# Patient Record
Sex: Female | Born: 1935 | Race: White | Hispanic: No | Marital: Married | Smoking: Former smoker
Health system: Southern US, Community
[De-identification: ages and names within clinical notes are randomized; demographics above are authoritative.]

## PROBLEM LIST (undated history)

## (undated) DIAGNOSIS — T8859XA Other complications of anesthesia, initial encounter: Secondary | ICD-10-CM

## (undated) DIAGNOSIS — J449 Chronic obstructive pulmonary disease, unspecified: Secondary | ICD-10-CM

## (undated) DIAGNOSIS — D649 Anemia, unspecified: Secondary | ICD-10-CM

## (undated) DIAGNOSIS — F329 Major depressive disorder, single episode, unspecified: Secondary | ICD-10-CM

## (undated) DIAGNOSIS — F419 Anxiety disorder, unspecified: Secondary | ICD-10-CM

## (undated) DIAGNOSIS — N3020 Other chronic cystitis without hematuria: Secondary | ICD-10-CM

## (undated) DIAGNOSIS — F32A Depression, unspecified: Secondary | ICD-10-CM

## (undated) DIAGNOSIS — I10 Essential (primary) hypertension: Secondary | ICD-10-CM

## (undated) DIAGNOSIS — M549 Dorsalgia, unspecified: Secondary | ICD-10-CM

## (undated) DIAGNOSIS — T4145XA Adverse effect of unspecified anesthetic, initial encounter: Secondary | ICD-10-CM

## (undated) DIAGNOSIS — N3946 Mixed incontinence: Secondary | ICD-10-CM

## (undated) DIAGNOSIS — M797 Fibromyalgia: Secondary | ICD-10-CM

## (undated) DIAGNOSIS — G8929 Other chronic pain: Secondary | ICD-10-CM

## (undated) HISTORY — PX: SPINAL CORD STIMULATOR IMPLANT: SHX2422

## (undated) HISTORY — PX: TONSILLECTOMY: SUR1361

## (undated) HISTORY — PX: APPENDECTOMY: SHX54

## (undated) HISTORY — DX: Depression, unspecified: F32.A

## (undated) HISTORY — PX: CATARACT EXTRACTION: SUR2

## (undated) HISTORY — DX: Dorsalgia, unspecified: M54.9

## (undated) HISTORY — PX: CHOLECYSTECTOMY: SHX55

## (undated) HISTORY — PX: ABDOMINAL HYSTERECTOMY: SHX81

## (undated) HISTORY — PX: KNEE SURGERY: SHX244

## (undated) HISTORY — DX: Major depressive disorder, single episode, unspecified: F32.9

## (undated) HISTORY — DX: Essential (primary) hypertension: I10

## (undated) HISTORY — DX: Chronic obstructive pulmonary disease, unspecified: J44.9

## (undated) HISTORY — PX: OTHER SURGICAL HISTORY: SHX169

## (undated) HISTORY — DX: Fibromyalgia: M79.7

---

## 1999-02-25 ENCOUNTER — Ambulatory Visit (HOSPITAL_COMMUNITY): Admission: RE | Admit: 1999-02-25 | Discharge: 1999-02-25 | Payer: Self-pay | Admitting: *Deleted

## 2000-12-03 ENCOUNTER — Ambulatory Visit (HOSPITAL_COMMUNITY): Admission: RE | Admit: 2000-12-03 | Discharge: 2000-12-03 | Payer: Self-pay | Admitting: Specialist

## 2000-12-03 ENCOUNTER — Encounter: Payer: Self-pay | Admitting: Specialist

## 2001-09-02 ENCOUNTER — Ambulatory Visit (HOSPITAL_COMMUNITY): Admission: RE | Admit: 2001-09-02 | Discharge: 2001-09-02 | Payer: Self-pay | Admitting: Orthopaedic Surgery

## 2001-09-02 ENCOUNTER — Encounter: Payer: Self-pay | Admitting: Orthopaedic Surgery

## 2001-12-16 ENCOUNTER — Other Ambulatory Visit: Admission: RE | Admit: 2001-12-16 | Discharge: 2001-12-16 | Payer: Self-pay | Admitting: Obstetrics and Gynecology

## 2002-09-20 ENCOUNTER — Encounter: Payer: Self-pay | Admitting: Obstetrics and Gynecology

## 2002-09-20 ENCOUNTER — Ambulatory Visit (HOSPITAL_COMMUNITY): Admission: RE | Admit: 2002-09-20 | Discharge: 2002-09-20 | Payer: Self-pay | Admitting: Obstetrics and Gynecology

## 2002-11-20 HISTORY — PX: CARDIAC CATHETERIZATION: SHX172

## 2002-11-22 ENCOUNTER — Ambulatory Visit (HOSPITAL_COMMUNITY): Admission: RE | Admit: 2002-11-22 | Discharge: 2002-11-22 | Payer: Self-pay | Admitting: *Deleted

## 2002-11-22 ENCOUNTER — Encounter: Payer: Self-pay | Admitting: *Deleted

## 2003-03-03 ENCOUNTER — Ambulatory Visit (HOSPITAL_COMMUNITY): Admission: RE | Admit: 2003-03-03 | Discharge: 2003-03-03 | Payer: Self-pay | Admitting: *Deleted

## 2003-06-05 ENCOUNTER — Emergency Department (HOSPITAL_COMMUNITY): Admission: EM | Admit: 2003-06-05 | Discharge: 2003-06-05 | Payer: Self-pay | Admitting: Emergency Medicine

## 2003-08-21 ENCOUNTER — Ambulatory Visit (HOSPITAL_COMMUNITY): Admission: RE | Admit: 2003-08-21 | Discharge: 2003-08-21 | Payer: Self-pay | Admitting: Internal Medicine

## 2003-10-11 ENCOUNTER — Encounter: Payer: Self-pay | Admitting: Cardiology

## 2003-10-19 ENCOUNTER — Ambulatory Visit (HOSPITAL_COMMUNITY): Admission: RE | Admit: 2003-10-19 | Discharge: 2003-10-19 | Payer: Self-pay | Admitting: Specialist

## 2004-02-20 ENCOUNTER — Ambulatory Visit (HOSPITAL_COMMUNITY): Admission: RE | Admit: 2004-02-20 | Discharge: 2004-02-20 | Payer: Self-pay | Admitting: Orthopaedic Surgery

## 2004-05-18 ENCOUNTER — Inpatient Hospital Stay (HOSPITAL_COMMUNITY): Admission: EM | Admit: 2004-05-18 | Discharge: 2004-05-21 | Payer: Self-pay | Admitting: Emergency Medicine

## 2004-07-26 ENCOUNTER — Ambulatory Visit (HOSPITAL_COMMUNITY): Admission: RE | Admit: 2004-07-26 | Discharge: 2004-07-26 | Payer: Self-pay | Admitting: Internal Medicine

## 2004-11-25 ENCOUNTER — Ambulatory Visit: Payer: Self-pay | Admitting: Cardiology

## 2004-12-26 ENCOUNTER — Ambulatory Visit (HOSPITAL_COMMUNITY): Admission: RE | Admit: 2004-12-26 | Discharge: 2004-12-26 | Payer: Self-pay | Admitting: Family Medicine

## 2005-01-08 ENCOUNTER — Ambulatory Visit: Payer: Self-pay | Admitting: Internal Medicine

## 2005-01-23 ENCOUNTER — Inpatient Hospital Stay (HOSPITAL_COMMUNITY): Admission: RE | Admit: 2005-01-23 | Discharge: 2005-01-27 | Payer: Self-pay | Admitting: Neurosurgery

## 2005-02-25 ENCOUNTER — Ambulatory Visit (HOSPITAL_COMMUNITY): Admission: RE | Admit: 2005-02-25 | Discharge: 2005-02-25 | Payer: Self-pay | Admitting: Neurosurgery

## 2005-03-03 ENCOUNTER — Inpatient Hospital Stay (HOSPITAL_COMMUNITY): Admission: RE | Admit: 2005-03-03 | Discharge: 2005-03-16 | Payer: Self-pay | Admitting: Neurosurgery

## 2005-05-07 ENCOUNTER — Ambulatory Visit (HOSPITAL_COMMUNITY): Admission: RE | Admit: 2005-05-07 | Discharge: 2005-05-07 | Payer: Self-pay | Admitting: Internal Medicine

## 2005-05-17 ENCOUNTER — Ambulatory Visit (HOSPITAL_COMMUNITY): Admission: RE | Admit: 2005-05-17 | Discharge: 2005-05-17 | Payer: Self-pay | Admitting: Neurosurgery

## 2005-10-14 ENCOUNTER — Ambulatory Visit (HOSPITAL_COMMUNITY): Admission: RE | Admit: 2005-10-14 | Discharge: 2005-10-14 | Payer: Self-pay | Admitting: Internal Medicine

## 2005-11-26 ENCOUNTER — Ambulatory Visit (HOSPITAL_COMMUNITY): Admission: RE | Admit: 2005-11-26 | Discharge: 2005-11-26 | Payer: Self-pay | Admitting: Internal Medicine

## 2006-02-13 ENCOUNTER — Encounter (HOSPITAL_COMMUNITY): Admission: RE | Admit: 2006-02-13 | Discharge: 2006-03-15 | Payer: Self-pay | Admitting: Orthopaedic Surgery

## 2006-05-22 ENCOUNTER — Ambulatory Visit (HOSPITAL_COMMUNITY): Admission: RE | Admit: 2006-05-22 | Discharge: 2006-05-22 | Payer: Self-pay | Admitting: Orthopaedic Surgery

## 2006-08-06 ENCOUNTER — Ambulatory Visit (HOSPITAL_COMMUNITY): Admission: RE | Admit: 2006-08-06 | Discharge: 2006-08-06 | Payer: Self-pay | Admitting: Internal Medicine

## 2006-08-06 ENCOUNTER — Encounter (HOSPITAL_COMMUNITY): Admission: RE | Admit: 2006-08-06 | Discharge: 2006-09-05 | Payer: Self-pay | Admitting: Internal Medicine

## 2006-08-07 ENCOUNTER — Ambulatory Visit (HOSPITAL_COMMUNITY): Admission: RE | Admit: 2006-08-07 | Discharge: 2006-08-07 | Payer: Self-pay | Admitting: Family Medicine

## 2006-11-24 ENCOUNTER — Emergency Department (HOSPITAL_COMMUNITY): Admission: EM | Admit: 2006-11-24 | Discharge: 2006-11-24 | Payer: Self-pay | Admitting: *Deleted

## 2007-01-13 ENCOUNTER — Ambulatory Visit (HOSPITAL_COMMUNITY): Payer: Self-pay | Admitting: Oncology

## 2007-01-13 ENCOUNTER — Encounter (HOSPITAL_COMMUNITY): Admission: RE | Admit: 2007-01-13 | Discharge: 2007-01-19 | Payer: Self-pay | Admitting: Oncology

## 2007-02-09 ENCOUNTER — Encounter (HOSPITAL_COMMUNITY): Admission: RE | Admit: 2007-02-09 | Discharge: 2007-03-11 | Payer: Self-pay | Admitting: Oncology

## 2007-03-12 ENCOUNTER — Ambulatory Visit (HOSPITAL_COMMUNITY): Payer: Self-pay | Admitting: Oncology

## 2007-03-25 ENCOUNTER — Encounter (HOSPITAL_COMMUNITY): Admission: RE | Admit: 2007-03-25 | Discharge: 2007-04-21 | Payer: Self-pay | Admitting: Oncology

## 2007-04-12 ENCOUNTER — Emergency Department (HOSPITAL_COMMUNITY): Admission: EM | Admit: 2007-04-12 | Discharge: 2007-04-12 | Payer: Self-pay | Admitting: Emergency Medicine

## 2007-04-23 ENCOUNTER — Encounter (HOSPITAL_COMMUNITY): Admission: RE | Admit: 2007-04-23 | Discharge: 2007-05-23 | Payer: Self-pay | Admitting: Oncology

## 2007-04-30 ENCOUNTER — Ambulatory Visit (HOSPITAL_COMMUNITY): Payer: Self-pay | Admitting: Oncology

## 2007-05-11 ENCOUNTER — Ambulatory Visit: Payer: Self-pay | Admitting: Cardiology

## 2007-05-26 ENCOUNTER — Observation Stay (HOSPITAL_COMMUNITY): Admission: RE | Admit: 2007-05-26 | Discharge: 2007-05-27 | Payer: Self-pay | Admitting: General Surgery

## 2007-05-26 ENCOUNTER — Encounter (INDEPENDENT_AMBULATORY_CARE_PROVIDER_SITE_OTHER): Payer: Self-pay | Admitting: General Surgery

## 2007-11-18 ENCOUNTER — Ambulatory Visit (HOSPITAL_COMMUNITY): Admission: RE | Admit: 2007-11-18 | Discharge: 2007-11-18 | Payer: Self-pay | Admitting: Orthopaedic Surgery

## 2008-01-04 ENCOUNTER — Ambulatory Visit (HOSPITAL_COMMUNITY): Admission: RE | Admit: 2008-01-04 | Discharge: 2008-01-04 | Payer: Self-pay | Admitting: Internal Medicine

## 2008-04-11 ENCOUNTER — Emergency Department (HOSPITAL_COMMUNITY): Admission: EM | Admit: 2008-04-11 | Discharge: 2008-04-11 | Payer: Self-pay | Admitting: Emergency Medicine

## 2008-04-19 ENCOUNTER — Inpatient Hospital Stay (HOSPITAL_COMMUNITY): Admission: EM | Admit: 2008-04-19 | Discharge: 2008-05-01 | Payer: Self-pay | Admitting: Emergency Medicine

## 2008-07-12 ENCOUNTER — Ambulatory Visit (HOSPITAL_COMMUNITY): Admission: RE | Admit: 2008-07-12 | Discharge: 2008-07-12 | Payer: Self-pay | Admitting: Internal Medicine

## 2008-12-14 ENCOUNTER — Encounter (HOSPITAL_COMMUNITY): Admission: RE | Admit: 2008-12-14 | Discharge: 2009-01-18 | Payer: Self-pay | Admitting: Orthopaedic Surgery

## 2009-01-08 ENCOUNTER — Ambulatory Visit (HOSPITAL_COMMUNITY): Admission: RE | Admit: 2009-01-08 | Discharge: 2009-01-08 | Payer: Self-pay | Admitting: Ophthalmology

## 2010-03-07 ENCOUNTER — Encounter (HOSPITAL_COMMUNITY)
Admission: RE | Admit: 2010-03-07 | Discharge: 2010-04-06 | Payer: Self-pay | Source: Home / Self Care | Attending: Neurology | Admitting: Neurology

## 2010-04-18 ENCOUNTER — Ambulatory Visit (HOSPITAL_COMMUNITY)
Admission: RE | Admit: 2010-04-18 | Discharge: 2010-04-18 | Payer: Self-pay | Source: Home / Self Care | Attending: Obstetrics and Gynecology | Admitting: Obstetrics and Gynecology

## 2010-07-10 ENCOUNTER — Ambulatory Visit (HOSPITAL_COMMUNITY)
Admission: RE | Admit: 2010-07-10 | Discharge: 2010-07-10 | Disposition: A | Discharge status: Home / Self Care | Payer: Medicare Other | Source: Ambulatory Visit | Attending: Internal Medicine | Admitting: Internal Medicine

## 2010-07-10 ENCOUNTER — Other Ambulatory Visit (HOSPITAL_COMMUNITY): Payer: Self-pay | Admitting: Internal Medicine

## 2010-07-10 DIAGNOSIS — R509 Fever, unspecified: Secondary | ICD-10-CM

## 2010-07-10 DIAGNOSIS — R059 Cough, unspecified: Secondary | ICD-10-CM | POA: Insufficient documentation

## 2010-07-10 DIAGNOSIS — R05 Cough: Secondary | ICD-10-CM | POA: Insufficient documentation

## 2010-07-12 ENCOUNTER — Other Ambulatory Visit (HOSPITAL_COMMUNITY): Payer: Self-pay | Admitting: Internal Medicine

## 2010-07-12 DIAGNOSIS — M549 Dorsalgia, unspecified: Secondary | ICD-10-CM

## 2010-07-26 LAB — BASIC METABOLIC PANEL WITH GFR
BUN: 18 mg/dL (ref 6–23)
CO2: 34 meq/L — ABNORMAL HIGH (ref 19–32)
Calcium: 9.4 mg/dL (ref 8.4–10.5)
Chloride: 100 meq/L (ref 96–112)
Creatinine, Ser: 0.8 mg/dL (ref 0.4–1.2)
GFR calc non Af Amer: >60 mL/min
Glucose, Bld: 93 mg/dL (ref 70–99)
Potassium: 3.8 meq/L (ref 3.5–5.1)
Sodium: 139 meq/L (ref 135–145)

## 2010-07-26 LAB — HEMOGLOBIN AND HEMATOCRIT, BLOOD
HCT: 33.5 % — ABNORMAL LOW (ref 36.0–46.0)
Hemoglobin: 11.5 g/dL — ABNORMAL LOW (ref 12.0–15.0)

## 2010-07-31 ENCOUNTER — Ambulatory Visit (HOSPITAL_COMMUNITY): Payer: Medicare Other

## 2010-08-01 ENCOUNTER — Ambulatory Visit (HOSPITAL_COMMUNITY)
Admission: RE | Admit: 2010-08-01 | Discharge: 2010-08-01 | Disposition: A | Discharge status: Home / Self Care | Payer: Medicare Other | Source: Ambulatory Visit | Attending: Internal Medicine | Admitting: Internal Medicine

## 2010-08-01 DIAGNOSIS — M47817 Spondylosis without myelopathy or radiculopathy, lumbosacral region: Secondary | ICD-10-CM | POA: Insufficient documentation

## 2010-08-01 DIAGNOSIS — M549 Dorsalgia, unspecified: Secondary | ICD-10-CM

## 2010-08-01 DIAGNOSIS — M79609 Pain in unspecified limb: Secondary | ICD-10-CM | POA: Insufficient documentation

## 2010-08-01 DIAGNOSIS — M5126 Other intervertebral disc displacement, lumbar region: Secondary | ICD-10-CM | POA: Insufficient documentation

## 2010-08-01 DIAGNOSIS — M545 Low back pain, unspecified: Secondary | ICD-10-CM | POA: Insufficient documentation

## 2010-08-05 LAB — BASIC METABOLIC PANEL
BUN: 12 mg/dL (ref 6–23)
BUN: 17 mg/dL (ref 6–23)
BUN: 19 mg/dL (ref 6–23)
BUN: 24 mg/dL — ABNORMAL HIGH (ref 6–23)
BUN: 6 mg/dL (ref 6–23)
CO2: 26 mEq/L (ref 19–32)
CO2: 26 mEq/L (ref 19–32)
CO2: 27 mEq/L (ref 19–32)
Calcium: 8.5 mg/dL (ref 8.4–10.5)
Calcium: 8.5 mg/dL (ref 8.4–10.5)
Calcium: 8.7 mg/dL (ref 8.4–10.5)
Calcium: 9.1 mg/dL (ref 8.4–10.5)
Calcium: 9.1 mg/dL (ref 8.4–10.5)
Chloride: 94 mEq/L — ABNORMAL LOW (ref 96–112)
Chloride: 98 mEq/L (ref 96–112)
Creatinine, Ser: 0.58 mg/dL (ref 0.4–1.2)
Creatinine, Ser: 0.71 mg/dL (ref 0.4–1.2)
Creatinine, Ser: 0.93 mg/dL (ref 0.4–1.2)
Creatinine, Ser: 0.93 mg/dL (ref 0.4–1.2)
Creatinine, Ser: 0.98 mg/dL (ref 0.4–1.2)
GFR calc Af Amer: >60 mL/min (ref >60)
GFR calc non Af Amer: 59 mL/min — ABNORMAL LOW (ref >60)
GFR calc non Af Amer: >60 mL/min (ref >60)
GFR calc non Af Amer: >60 mL/min (ref >60)
Glucose, Bld: 103 mg/dL — ABNORMAL HIGH (ref 70–99)
Glucose, Bld: 110 mg/dL — ABNORMAL HIGH (ref 70–99)
Glucose, Bld: 115 mg/dL — ABNORMAL HIGH (ref 70–99)
Glucose, Bld: 167 mg/dL — ABNORMAL HIGH (ref 70–99)
Glucose, Bld: 95 mg/dL (ref 70–99)
Potassium: 4.2 mEq/L (ref 3.5–5.1)
Sodium: 131 mEq/L — ABNORMAL LOW (ref 135–145)

## 2010-08-05 LAB — VITAMIN B12: Vitamin B-12: 459 pg/mL (ref 211–911)

## 2010-08-05 LAB — URINALYSIS, ROUTINE W REFLEX MICROSCOPIC
Bilirubin Urine: NEGATIVE
Hgb urine dipstick: NEGATIVE
Ketones, ur: NEGATIVE mg/dL
Protein, ur: NEGATIVE mg/dL
Specific Gravity, Urine: <1.005 — ABNORMAL LOW (ref 1.005–1.030)
Urobilinogen, UA: 0.2 mg/dL (ref 0.0–1.0)

## 2010-08-05 LAB — CBC
Platelets: 276 10*3/uL (ref 150–400)
RDW: 13.3 % (ref 11.5–15.5)

## 2010-08-05 LAB — DIFFERENTIAL
Basophils Absolute: 0 10*3/uL (ref 0.0–0.1)
Eosinophils Relative: 1 % (ref 0–5)
Lymphocytes Relative: 19 % (ref 12–46)
Neutro Abs: 7.4 10*3/uL (ref 1.7–7.7)
Neutrophils Relative %: 76 % (ref 43–77)

## 2010-08-05 LAB — URINE CULTURE: Colony Count: 80000

## 2010-08-05 LAB — RPR: RPR Ser Ql: NONREACTIVE

## 2010-08-05 LAB — URINE MICROSCOPIC-ADD ON

## 2010-08-05 LAB — HOMOCYSTEINE: Homocysteine: 5.5 umol/L (ref 4.0–15.4)

## 2010-08-20 ENCOUNTER — Other Ambulatory Visit (HOSPITAL_COMMUNITY): Payer: Self-pay | Admitting: Physician Assistant

## 2010-08-20 DIAGNOSIS — L049 Acute lymphadenitis, unspecified: Secondary | ICD-10-CM

## 2010-08-20 DIAGNOSIS — R220 Localized swelling, mass and lump, head: Secondary | ICD-10-CM

## 2010-08-23 ENCOUNTER — Ambulatory Visit (HOSPITAL_COMMUNITY): Payer: Medicare Other

## 2010-08-27 ENCOUNTER — Ambulatory Visit (HOSPITAL_COMMUNITY): Payer: Medicare Other

## 2010-09-03 NOTE — Op Note (Signed)
NAME:  Lisa Saunders, Lisa Saunders NO.:  [id number]   MEDICAL RECORD NO.:  [id number]          PATIENT TYPE:  OBV   LOCATION:  A325                          FACILITY:  [facility]   PHYSICIAN:  Barbaraann Barthel, M.D. DATE OF BIRTH:  02/26/1933   DATE OF PROCEDURE:  05/26/2007  DATE OF DISCHARGE:                               OPERATIVE REPORT   SURGEON:  Barbaraann Barthel, M.D.   PREOPERATIVE DIAGNOSIS:  Cholecystitis and cholelithiasis   POSTOPERATIVE DIAGNOSIS:  Cholecystitis and cholelithiasis   PROCEDURE:  Laparoscopic cholecystectomy.   SPECIMENS:  Gallbladder with stones.   NOTE:  This is a 77 year old white female who had recurrent episodes of  right upper quadrant pain and nausea.  This was post prandial in nature.  She has had approximately there months of discomfort.  She was sent my  way by Dr. Hilda Lias and Dr. Sherwood Gambler after sonogram revealed the presence of  stones.  The sonogram did not reveal any active cholecystitis.  The  liver function studies were grossly within normal limits.  We put her on  diet restriction and had her checked out by the medical service  including the cardiology service preoperatively and when she was  cleared, we scheduled her surgery electively.   GROSS OPERATIVE FINDINGS:  There were some adhesions around the distal  portion of the gallbladder, large stones within the gallbladder, there  were multiple stones, the largest one was approximately the of a hens  egg, the other ones were the size of large pebbles.  These were sent as  specimens.  One was given to the family postoperatively as per their  request.  There were no other findings abnormally in the right upper  quadrant.   TECHNIQUE:  The patient was placed in the supine position. After the  adequate administration of general anesthesia via endotracheal  intubation, her entire abdomen was prepped with Betadine solution and  draped in the usual manner.  A Foley catheter, prior to this,  was  aseptically inserted. With the patient prepped and draped in the usual  manner, she was placed in Trendelenburg position and a periumbilical  incision was carried out over the superior aspect of the umbilicus.  The  fascia was dissected down so that it was clearly visualized and the  sharp towel clip was used to elevate this. A Veress needle was then  inserted and confirmed in position with a saline drop test.  Then, an 11  mm cannula using the Visiport technique was inserted so that we were  able to visualize the placement of another 11 mm cannula in the  epigastrium and two 5 mm cannulae in the right upper quadrant laterally.  The gallbladder was grasped, adhesions were taken down. The cystic duct  was clearly visualized, triply silver clipped on the side of the common  bile duct and singly silver clipped on the side of the gallbladder.  Divided, likewise, was the cystic artery.  The gallbladder was then  removed without any spillage using the hook cautery device from the  liver bed.  We then removed it using the EndoCatch device.  I checked  for hemostasis, cauterized with the cautery device, and I elected to  leave a piece of Surgicel within the liver bed as well as a Al Pimple drain which exited through one of the 5 mm cannula sites.  We then  desufflated the abdomen, closed the fascia in the area of the  epigastrium and the umbilicus using 0 Polysorb suture and then closed  the skin with the stapling device.  I used 10 mL of 0.5%  Sensorcaine to help with postoperative comfort.  Prior to closure, all  sponge, needle, and instrument counts were found to be correct.  Estimated blood loss was minimal.  The patient tolerated the procedure  well and was taken to the recovery room in satisfactory condition.      Barbaraann Barthel, M.D.  Electronically Signed     WB/MEDQ  D:  05/26/2007  T:  05/26/2007  Job:  [id number]   cc:   Madelin Rear. Sherwood Gambler, MD  Fax: [phone]   J.  Darreld Mclean, M.D.  Fax: [phone]

## 2010-09-03 NOTE — Group Therapy Note (Signed)
NAME:  Lisa Saunders, Lisa Saunders NO.:  [id number]   MEDICAL RECORD NO.:  [id number]          PATIENT TYPE:  INP   LOCATION:  A339                          FACILITY:  [facility]   PHYSICIAN:  Dorris Singh, DO    DATE OF BIRTH:  [birth year]   DATE OF PROCEDURE:  04/28/2008  DATE OF DISCHARGE:                                 PROGRESS NOTE   Patient seen today, doing well.  We are still waiting for her level to  __________  to be completed.  At that point in time she will be able to  be discharged.  The patient was complaining about a UTI yesterday.  However, her urine was clean.  We will continue with current therapy.   Temperature 97.9, pulse 83, respirations 20, blood pressure 166/98.  GENERALLY:  She is well-developed, well-nourished, no acute distress.  HEART:  Regular rhythm.  LUNGS:  Clear auscultation bilaterally.  ABDOMEN:  Soft, nontender.  EXTREMITIES:  Positive pulses.   She has no labs ordered for today, other than the UA.  Everything else  has been corrected.  We will continue to monitor her and await her  transfer to a facility.  The patient's condition is stable.      Dorris Singh, DO  Electronically Signed     CB/MEDQ  D:  04/28/2008  T:  04/28/2008  Job:  [id number]

## 2010-09-03 NOTE — Letter (Signed)
May 11, 2007    Barbaraann Barthel, M.D.  [address]  [place],  [place] [address]   RE:  Lisa Saunders, Lisa Saunders  MRN:  [id number]  /  DOB:  20-Sep-1932   Dear Annette Stable:   It was my pleasure assisting Ms. Nater in consultation today at your  request prior to planned elective cholecystectomy.  As you know, this  nice woman has previously been seen by both Dr. Dorethea Clan and Dr. Daleen Squibb, but  has no known cardiovascular disease other than hypertension.  She has  had palpitations in the past and chest pain, but does not have  demonstrable coronary disease or any structural heart disease.  There is  a history of remote tobacco use, but none for years.  She carries a  diagnosis of fibromyalgia.  She has had some arthritis and was thought  to possibly have rheumatoid arthritis in the past.   Cardiac testing has included a resting MUGA in November 2004 that showed  a normal ejection fraction.  Cardiac catheterization was performed in  August 2004 at which time she had no significant coronary disease, but  was thought to have mildly impaired left ventricular systolic function.  Estimated ejection fraction was 0.45.  It is possible that was an  underestimate due to technical problems.  She has had a number of  peripheral vascular studies without significant problems.  Some  atherosclerotic plaque was present, but no obstruction and no DVT.   PAST SURGICAL HISTORY:  Is notable for hysterectomy, tonsillectomy and  multiple low back procedures.   SOCIAL HISTORY:  Retired; sedentary lifestyle; married with two adult  children.   FAMILY HISTORY:  Father died prematurely due to myocardial infarction;  mother died at advanced age without significant chronic diseases.  She  has one sister who is alive and well.   REVIEW OF SYSTEMS:  Is notable for intermittent headaches, the need for  corrective lenses for close vision, arthritic discomfort in the hands  and back and a history of asthma.  All other systems  reviewed and are  negative.   PHYSICAL EXAMINATION:  Pleasant woman in no acute distress.  The weight  is 192, 22 pounds more than in August of 2006.  Blood pressure 120/80,  heart rate 70 and regular, respirations 14.  HEENT:  Anicteric sclerae; normal lids and conjunctivae; normal oral  mucosa.  NECK:  No jugular venous distention; normal carotid upstrokes without  bruits.  ENDOCRINE:  No thyromegaly.  HEMATOPOIETIC:  No adenopathy.  PSYCHIATRIC:  Alert and oriented; normal affect.  SKIN:  No significant lesions.  LUNGS:  Minimal rhonchi.  CARDIAC:  Normal first and second heart sounds; fourth heart sound  present; normal PMI.  ABDOMEN:  Soft and nontender; no masses; no organomegaly; normal bowel  sounds without bruits.  EXTREMITIES:  No edema; normal distal pulses.  NEUROLOGIC:  Normal cranial nerves; symmetric strength and tone.   EKG:  Normal sinus rhythm; slightly delayed R-wave progression; voltage  criteria for LVH.   IMPRESSION:  Ms. Utley has no known cardiovascular disease other than  hypertension.  She has enjoyed generally good health except for her  orthopedic problems and osteoporosis.  Accordingly, she appears to have  an age - appropriate risk for the proposed surgical procedure.  Her  medical therapy is appropriate.  She does not require special  precautions other than routine preoperative testing to verify that her  moderate dose diuretic therapy is not causing hypokalemia or prerenal  azotemia.  I  will be happy to assist with her care in hospital and any  matter that I can.  Thank so much for sending this nice woman to see me.    Sincerely,      Gerrit Friends. Dietrich Pates, MD, [facility]  Electronically Signed    RMR/MedQ  DD: 05/11/2007  DT: 05/11/2007  Job #: [id number]   CC:    Madelin Rear. Sherwood Gambler, MD

## 2010-09-03 NOTE — Procedures (Signed)
NAME:  Lisa Saunders, Lisa Saunders NO.:  [id number]   MEDICAL RECORD NO.:  [id number]          PATIENT TYPE:  INP   LOCATION:  A339                          FACILITY:  [facility]   PHYSICIAN:  Kofi A. Gerilyn Pilgrim, M.D. DATE OF BIRTH:  18-Nov-1932   DATE OF PROCEDURE:  DATE OF DISCHARGE:                              EEG INTERPRETATION   HISTORY:  This is a 77 year old lady who presents with altered mental  status and confusion.  The evaluation is being done for possible seizure  activity.   MEDICATIONS:  Keppra, Protonix, heparin, Ativan, Phenergan, Zofran,  Ventolin, Tylenol, and Xanax.   ANALYSIS:  A 16-channel recording is conducted for 21 minutes.  There is  a posterior rhythm of 6 Hz maximum.  The patient does have slower delta  activity seen throughout the recording of 3-4 Hz; however, there is no  focal or lateralized slowing.  There is no epileptiform activity  observed.   IMPRESSION:  Abnormal recording showing moderate generalized slowing;  however, there is no epileptiform activity observed.      Kofi A. Gerilyn Pilgrim, M.D.  Electronically Signed     KAD/MEDQ  D:  04/23/2008  T:  04/24/2008  Job:  [id number]

## 2010-09-03 NOTE — Group Therapy Note (Signed)
NAME:  Lisa Saunders, Lisa Saunders NO.:  [id number]   MEDICAL RECORD NO.:  [id number]          PATIENT TYPE:  INP   LOCATION:  A339                          FACILITY:  [facility]   PHYSICIAN:  Dorris Singh, DO    DATE OF BIRTH:  1932-05-24   DATE OF PROCEDURE:  04/27/2008  DATE OF DISCHARGE:                                 PROGRESS NOTE   HISTORY:  The patient is seen today.  States she is feeling fine.  However, complaining of a possible urinary tract infection.  States she  is getting the feeling like she has some hesitancy and some urgency.  We  will go ahead and check her urine today.  Other than that, we are still  waiting for a level to pace her.   PHYSICAL EXAMINATION:  VITAL SIGNS:  Temperature 97.3, pulse 87,  respirations 20, blood pressure 140/80.  GENERAL:  The patient is alert,  oriented, pleasant, well-developed, well-nourished in no acute distress.  HEART:  Regular rate and rhythm.  LUNGS:  Clear to auscultation bilaterally.  ABDOMEN:  Soft, nontender and nondistended.  EXTREMITIES:  Positive pulses.  No ecchymosis, edema, cyanosis.   LABORATORY DATA:  Sodium 137, potassium 4.1, chloride 103, CO2 26,  glucose 95, BUN 19, creatinine 0.93.   ASSESSMENT/PLAN:  1. Altered mental status which is improved.  The patient is clearing      and able to answer questions appropriately and tell stories      appropriately.  2. Seizures.  The patient has not had any seizure episodes since she      has been hospitalized.  3. Gastroenteritis, improved.  4. Hesitancy and frequency.  We will go ahead and check an urine to      see if it is within normal limits.  5. The patient is complaining of a history of asthma.  She would like      some Advair.  We will go ahead and get her some Advair as well.  6. Her hyponatremia has corrected with IV fluids.  We will continue to      monitor the patient and make changes as necessary.      Dorris Singh, DO  Electronically  Signed     CB/MEDQ  D:  04/27/2008  T:  04/27/2008  Job:  [id number]

## 2010-09-03 NOTE — Group Therapy Note (Signed)
NAME:  Lisa Saunders, VENNING NO.:  [id number]   MEDICAL RECORD NO.:  [id number]          PATIENT TYPE:  INP   LOCATION:  A339                          FACILITY:  [facility]   PHYSICIAN:  Dorris Singh, DO    DATE OF BIRTH:  05-Mar-1933   DATE OF PROCEDURE:  04/29/2008  DATE OF DISCHARGE:                                 PROGRESS NOTE   The patient states she is doing fine today.  She was discussing some  issues about her bill which I could not help her resolve but she has no  complaints.   PHYSICAL EXAMINATION:  VITAL SIGNS:  Her vitals are 97.1, pulse 82,  respirations 20, blood pressure 161/85.  GENERAL:  Generally she is well-developed, well-nourished, in no acute  distress.  HEART:  Regular rate and rhythm.  LUNGS:  Clear to auscultation bilaterally.  ABDOMEN:  Soft, nontender and nondistended.  EXTREMITIES:  Positive pulses.   She has no labs ordered for today.   ASSESSMENT AND PLAN:  1. Gastroenteritis which is improved.  2. Altered mental status which is resolved.  3. Urinalysis which is resolved.  We are just waiting for the patient      to go to rehab facility due to her ________.      Dorris Singh, DO  Electronically Signed     CB/MEDQ  D:  04/29/2008  T:  04/29/2008  Job:  [id number]

## 2010-09-03 NOTE — H&P (Signed)
NAME:  Lisa Saunders, Lisa Saunders            ACCOUNT NO.:  [id number]   MEDICAL RECORD NO.:  [id number]          PATIENT TYPE:  AMB   LOCATION:  DAY                           FACILITY:  [facility]   PHYSICIAN:  J. Darreld Mclean, M.D. DATE OF BIRTH:  05/06/1932   DATE OF ADMISSION:  DATE OF DISCHARGE:  LH                              HISTORY & PHYSICAL   The patient is a 77 year old female with pain and tenderness in her left  knee.  She has a tear of the lateral meniscus of the left knee that was  demonstrated on an MRI done some time ago on May 22, 2006.  She also  had a degenerative tear of the medial meniscus and medial root.  I  advised for possible surgery and we discussed that on several occasions,  for arthroscopy of the medial meniscus.  However, the patient was having  problems with her blood and developed a severe anemia.  This was seen  and evaluated and treated by Dr. Mariel Sleet.  The patient has also had  problems after a lumbar laminectomy and decompression approximately 3  years ago and after during which time she had CVA and has residual  waddling type gait and abnormal sensation in both lower extremities.  The patient also developed problems with her gallbladder.  Dr. Malvin Johns  eventually did a cholecystectomy on the patient after her hemoglobin  rose to a satisfactory level.  He did a cholecystectomy on February 4 of  this year.  The patient was originally set up for surgery several weeks  ago but developed food poisoning and the surgery had to be postponed.  The patient has pain and tenderness of her left knee, particular  medially.  She has giving way of the knee.  Surgery has been  recommended.  Risks and imponderables of the procedure have been  discussed.   ALLERGIES:  The patient states in the past she has had an allergy to  oxycodone but she takes Percocet so therefore I do not see how she can  be allergic to it.   MEDICATIONS:  1. She takes Hyzaar 50/12.5 daily.  2.  Prozac 20 mg daily.  3. Inderal LA daily.  4. Ambien 10 as needed.  5. Flonase 2 sprays daily.  6. Albuterol inhaler.  7. Fosamax weekly.  8. She takes Endocet 10/325 for pain.  9. She has taken Percocet 7.5/325 for pain.   PRIMARY CARE PHYSICIAN:  She is a patient of Dr. Phillips Odor.   PAST MEDICAL HISTORY:  The patient has had surgery on her back as stated  and afterwards complications of a CVA and she was very, very ill for a  period of time and hospitalized for a long period of time after that  particular surgery in 2006.  She has chronic obstructive pulmonary  disease, mitral valve prolapse, left ventricular dysfunction as well.  These are stable.  Status post cholecystectomy as stated.   SOCIAL HISTORY:  The patient is married, lives with her husband.  She is  retired from [employer].   PHYSICAL EXAMINATION:  GENERAL:  The patient is alert, cooperative  and  oriented.  HEENT:  Negative.  NECK:  Supple.  LUNGS:  Clear to P&A.  HEART:  Regular rate and rhythm without murmur heard.  ABDOMEN:  Soft, nontender without masses.  EXTREMITIES:  She has a waddling type gait.  She has an abnormal gait  secondary to old CVA.  It effects both lower extremities.  She has pain  and tenderness in her left knee and it actually hurts a little more  laterally than medially but MRI showed a tear of the medial meniscus.  Other extremities are negative.  CNS:  Is as stated.  SKIN:  Is intact.   IMPRESSION:  Tear medial meniscus left knee, possible lateral meniscal  tear.   PLAN:  Operative arthroscopy of the left knee.  Labs are pending.                                            ______________________________  J. Darreld Mclean, M.D.     JWK/MEDQ  D:  11/17/2007  T:  11/17/2007  Job:  [id number]

## 2010-09-03 NOTE — Op Note (Signed)
NAME:  Lisa Saunders, Lisa Saunders            ACCOUNT NO.:  [id number]   MEDICAL RECORD NO.:  [id number]          PATIENT TYPE:  AMB   LOCATION:  DAY                           FACILITY:  [facility]   PHYSICIAN:  J. Darreld Mclean, M.D. DATE OF BIRTH:  11-08-1932   DATE OF PROCEDURE:  DATE OF DISCHARGE:                               OPERATIVE REPORT   PREOPERATIVE DIAGNOSIS:  Tear of medial and lateral meniscus of the left  knee.   POSTOPERATIVE DIAGNOSIS:  Tear of medial and lateral meniscus of the  left knee.   PROCEDURE:  Operative arthroscopy; partial, medial, and lateral  meniscectomy.   ANESTHESIA:  General.   SURGEON:  J. Darreld Mclean, MD.   DRAINS:  None.   TOURNIQUET TIME:  29 minutes.   INDICATIONS:  The patient is a 77 year old female with pain and  tenderness in her left knee.  She had an MRI in February 2008 showing a  tear at the medial meniscus and she has developed more symptoms  laterally and I think she has a tear in the medial and lateral meniscus.  Surgery has been delayed over the last year and half because of medical  reasons.  She had a very low hemoglobin and iron levels and this has  been resolved slowly.  Then, she had problems with her gallbladder and  she had surgery on that in February 2009 by Dr. Malvin Johns.  She was  scheduled surgery earlier of this summer, but she had food poisoning,  which was postponed until now.  Risks and imponderables of the procedure  have been discussed with the patient multiple times.  She appears to  understand the procedure and has asked appropriate questions.  I have  also had discussions with her husband.   DESCRIPTION OF PROCEDURE:  She was seen in the holding area.  The left  knee was identified as correct a surgical site.  She placed a mark on  the left knee.  I placed a mark on the left knee.  She was brought to  the operating room and placed supine.  She was given general anesthesia.  Leg holder and tourniquet placed,  deflated left upper thigh.  She was  prepped and draped in the usual manner.   We had a generalized time-out, identified the patient as Lisa Saunders,  that we were doing the left knee, and it was a correct surgical site.  All instrumentation was deemed to be working and properly placed up.   The leg was then elevated and wrapped circumferentially with an Esmarch  bandage.  Tourniquet inflated to 300 mmHg.  Esmarch bandage removed.  Inflow cannula was inserted medially and lactated Ringer's instilled  into the knee by an infusion pump.  Arthroscope inserted laterally and  knee was systematically examined.  Suprapatellar pouch had synovitis and  grade II to III changes at the undersurface of the patella.  Medially,  she had grade 3 to 4 changes, there was eburnated bone on the edge of  the tibia posteriorly where the tear was.  She had a stellate tear and  degenerative-type tear of the medial  meniscus posterior horn.  There  were grade 4 changes in some areas of the femoral condyle, but mainly  grade 3.  There were no loose fragments.  The anterior cruciate was  intact, but degenerative, and the lateral meniscus had a small rim tear  from the posterior aspect.  There was grade 3 changes on the femur and  tibia laterally.   Attention was then directed to the medial side.  Using meniscal punch  and meniscal shaver, the posterior horn of the medial meniscus was  removed and had a good smooth contour.  Laterally, the edge of the  peripheral tear was removed with a meniscal shaver and meniscal punch  and a good smooth contours were obtained.  The knee was systematically  reexamined and no new pathology found.  The wounds were irrigated with  lactated Ringer's.  The wounds were reapproximated using 3-0 nylon in an  interrupted vertical mattress manner.  Marcaine 0.25% instilled into  each portal.  Tourniquet deflated after 29 minutes.  A sterile dressing  applied and a bulky dressing was  applied.  The patient was given  Percocet for pain.  I will see her in the office in approximately 10  day, 2 weeks.  She has physical therapy arranged.  Will call the office  or the hospital with persistent pain or difficulty.           ______________________________  Shela Commons. Darreld Mclean, M.D.     JWK/MEDQ  D:  11/18/2007  T:  11/18/2007  Job:  [id number]

## 2010-09-03 NOTE — Group Therapy Note (Signed)
NAME:  Lisa Saunders, Lisa Saunders NO.:  [id number]   MEDICAL RECORD NO.:  [id number]          PATIENT TYPE:  INP   LOCATION:  A339                          FACILITY:  [facility]   PHYSICIAN:  Dorris Singh, DO    DATE OF BIRTH:  [birth year]   DATE OF PROCEDURE:  04/30/2008  DATE OF DISCHARGE:                                 PROGRESS NOTE   The patient was seen today doing well.  No changes.   PHYSICAL EXAMINATION:  VITAL SIGNS:  Blood pressure is a little  elevated.  We will have them recheck it and change therapy as needed.  Temperature 98.2, pulse 84, respirations 20, blood pressure 142/93.  GENERAL:  Generally she is alert and oriented x2 and the longer you talk  with her the more you realize that her cognition is not stable.  HEART:  Regular rate and rhythm.  LUNGS:  Clear to auscultation bilaterally.  ABDOMEN:  Soft, nontender, nondistended.   There are no labs.   ASSESSMENT:  Gastroenteritis which is improved.  Altered mental status  which is resolved.  Hypertension which we will change and address her  medications.  We are still waiting for her, due to her level 2 PASAR we  are still waiting for placement.      Dorris Singh, DO  Electronically Signed     CB/MEDQ  D:  04/30/2008  T:  04/30/2008  Job:  [id number]

## 2010-09-03 NOTE — Consult Note (Signed)
NAME:  Lisa Saunders, Lisa Saunders NO.:  [id number]   MEDICAL RECORD NO.:  [id number]          PATIENT TYPE:  INP   LOCATION:  A339                          FACILITY:  [facility]   PHYSICIAN:  Kofi A. Gerilyn Pilgrim, M.D. DATE OF BIRTH:  01/06/1933   DATE OF CONSULTATION:  DATE OF DISCHARGE:                                 CONSULTATION   REASON FOR CONSULTATION:  Altered mental status and seizure.   The patient is a 77 year old white female who apparently had witnessed  events which apparently seemed consistent with seizure activity.  There  is no previous history of seizures.  The patient was confused and unable  to give a history after the event.  There is no previous history of  seizures, no family history of seizures, no history of head injuries,  meningitis, or stroke in the past.  The patient was given Keppra and  appears to be drowsy and somewhat lethargic while hospitalized.  The  patient has had a 2-week onset of diarrhea before being hospitalized,  etiology is unclear.  She has been started on Darvocet about a week ago  as the only new medication.   Past medical history is significant for chronic back pain, chronic knee  pain, rheumatoid arthritis, fibromyalgia, and hypertension.   PAST SURGICAL HISTORY:  She had back surgery, knee surgery,  cholecystectomy, and hysterectomy.  She has had cardiac catheterization  which essentially was unrevealing for any occlusive disease.  She does  have ejection fraction of 45%.   ADMISSION MEDICATIONS:  1. Xanax 1 mg q.i.d.  2. Metoprolol 50 mg daily.  3. Lasix 40 mg b.i.d.  4. Potassium chloride.  5. Lexapro.  6. Calcitonin nasal spray.  7. Chlordiazepoxide.  8. Darvocet.  9. Nitrofurantoin.  10.Lomotil, started recently with multiple diarrhea.   REVIEW OF SYSTEMS:  Essentially unrevealing.   SOCIAL HISTORY:  She is a former Psychologist, occupational, retired many years ago.  No  alcohol use.  No illicit drug use.  The patient ambulates with a  cane.   Family history is positive for rheumatoid arthritis.   PHYSICAL EXAMINATION:  GENERAL:  An obese, pleasant lady.  She is lying  in the bed with eyes closed.  VITAL SIGNS:  Temperature 97.8, pulse 107, respirations 20, blood  pressure 120/80.  NECK:  Supple.  HEENT:  Head is normocephalic and atraumatic.  ABDOMEN:  Obese, but soft.  EXTREMITIES:  Mild swelling of the knees bilaterally.  She has  significant arthritic changes of the hands.  MENTATION:  The patient is lying in the bed with eyes closed.  She does  open her eyes with slight sternal rub.  She is oriented to year, month,  date, and situation.  She follows commands well.  She converses fairly  well.  She is lucid and coherent once awakened.  She follows commands  bilaterally.  CRANIAL NERVES:  Pupils equal, round, and reactive to light and  accommodation.  Extraocular movements are full.  Tongue midline.  Uvula  midline.  Visual fields are intact.  Facial muscle strength is  symmetric.  Motor examination shows normal tone, bulk, and  strength  throughout.  She did have some weakness in the proximal muscle of the  leg, but with repeated testing she actually had 5/5 strength.  She did  have significant knee pain which limits her strength at times.  Reflexes  are preserved.  Plantar reflexes are both downgoing.  Sensation limited  to light temperature and light touch.  Coordination shows no tremors,  dysmetria, or parkinsonism.   TEST DATA:  MRI of the brain is obtained and shows atrophy and  significant small vessel changes, otherwise nothing acute.  CT scan  shows similar findings.  CT of the cervical spine shows multilevel  degenerative disk and arthritic changes.  Sodium 129 on admission,  potassium 3.9, chloride 94, CO2 25, BUN 9, creatinine 0.8, glucose 116,  calcium 9.0.  WBC 12.5, hemoglobin 13.8, platelet count of 335.  Urinalysis, leukocytes trace, wbc 3-6, rbc 0-2.  Ammonia undetected,  magnesium 1.8,  calcium 9.0.  UDS positive for benzodiazepines as she is  taking IV.  Stool C. difficile negative.   ASSESSMENT:  What appears to be a single seizure episode.  The patient  does not have any clear provoking factor other than possibly a  hyponatremia.  There is no risk factor for recurrent events such as  abnormal MRI or history of stroke.  She did have a EEG which was  negative for anything for epileptiform activity.  I do not believe that  she has increased risk of recurrent events and probably is not  epileptic.  She may not need long-term antiepileptic medication.  She  does have drowsiness which I suspect is coming from medication effect.  She complained of significant knee pain likely from degenerative joint  disease.   RECOMMENDATIONS:  I would reduce the dose of the Keppra.  She in fact  may not need it as she has had a normal EEG and does not seem to have  risk factors for recurrent seizures and may want to discontinue this in  a month or so.  We will start her on anti-inflammatory medications for  her knee pain.       Kofi A. Gerilyn Pilgrim, M.D.  Electronically Signed     KAD/MEDQ  D:  04/24/2008  T:  04/24/2008  Job:  [id number]

## 2010-09-03 NOTE — Discharge Summary (Signed)
NAME:  Lisa Saunders, WITTERS NO.:  [id number]   MEDICAL RECORD NO.:  [id number]          PATIENT TYPE:  INP   LOCATION:  A339                          FACILITY:  [facility]   PHYSICIAN:  Osvaldo Shipper, MD     DATE OF BIRTH:  Aug 14, 1932   DATE OF ADMISSION:  04/19/2008  DATE OF DISCHARGE:  LH                               DISCHARGE SUMMARY   Please review H&P dictated by myself actually had for details regarding  the patient's presenting illness.   The patient's PMD is Dr. Sherwood Gambler.  The patient is going to be placed  hopefully into the [facility].   DISCHARGE DIAGNOSIS:  1. Altered mental status, etiology unclear, improved.  2. Seizure episodes, not to be on antiepileptics per neurologist.  3. Hypertension.  4. Rheumatoid arthritis with right knee pain improved.  5. Gastroenteritis, improved.  6. Hyponatremia, improved.   BRIEF HOSPITAL COURSE:  1. Briefly this is a 77 year old Caucasian female who presented the      hospital with episodes of seizure.  The patient was also very      confused and disoriented.  The patient was admitted and started on      Keppra.  She underwent an EEG which did not reveal any epileptic      activity.  She also underwent MRI of the brain which showed atrophy      and small-vessel disease without anything acute.  She also      underwent a CT C-spine which did not show any acute findings as      well.   The patient was seen by Dr. Gerilyn Pilgrim who felt that her Keppra could be  discontinued as it could be contributing to her confusion and also  because according to him, she had only one seizure episode.  So he  discontinued this medication.  She has not had any seizures since the  day of admission.   1. Her acute confusion also seems to be improving but she does not      make sense when she talks all the time.  So the reason for this is      not very clear.  I do not know if this is a manifestation of a      psychiatric disorder or if  the patient has underlying dementia.      This issue is not very clear but her behavior has been stable.  She      has not had periods of agitation.  She is just pleasantly confused.      She will require outpatient neurological follow-up.   1. Right knee pain.  She does have history of rheumatoid arthritis.      She was having significant pain yesterday.  We started her on      steroids and her pain is significantly improved today.   1. Hypertension is also stable.  She is on Norvasc and beta-blockers.   She also had hyponatremia which has also improved with IV hydration.  She also had hypokalemia which has also improved.   RPR was nonreactive.  Her B12  was normal.  Homocystine was normal.  Urine drug screen showed only benzos.  Ammonia level was less than 8.  The LFTs were normal.   Today, January 5, she is feeling much better and appears to be in better  spirits.  She has a good appetite.  Denies any complaints.  Knee is  better.  Vital signs are all stable.  Blood pressure is 125/81, heart  rate is 82, saturation 99% on room air.  Lungs: Clear to auscultation.  Cardiovascular:  S1, S2 normal.  Right knee appears to be much improved  with good range of motion as compared to yesterday.  Sodium is improved  to 133 this morning.   Overall she is doing quite well and is stable for discharge.  Social  worker is working on this case and we are waiting for clearance for her  possible underlying psychiatric disorder before she can go to a nursing  home.   DISCHARGE MEDICATIONS:  1. Norvasc 5 mg daily.  2. Salmon calcitonin nasal spray 1 spray alternating nostrils every      day.  3. Toprol XL 75 mg daily.  4. Relafen 750 mg b.i.d.  5. Nystatin powder to her groin and perineum area t.i.d.  6. Prednisone 40 mg daily for 3 days followed by 20 mg daily for 3      days followed by 10 mg daily for 6 days. so 40, 20 and 10 mg and      then stop.  7. She was on Lexapro 20 mg daily at home  which can be continued.  8. Lasix 20 mg p.o. once daily.  9. Xanax 0.5 mg p.o. b.i.d. as needed for anxiety.   FOLLOWUP:  1. Follow-up with her primary medical doctor in 1-2 weeks.  2. With Dr. Gerilyn Pilgrim in 3-4 weeks   DIET:  Heart healthy diet.   PHYSICAL ACTIVITY:  She will need physical therapy at the nursing home.   Consultation during this admission by Dr. Gerilyn Pilgrim.   Total time on this discharge encounter 35 minutes.      Osvaldo Shipper, MD  Electronically Signed     GK/MEDQ  D:  04/25/2008  T:  04/25/2008  Job:  [id number]   cc:   Darleen Crocker A. Gerilyn Pilgrim, M.D.  Fax: [phone]   Madelin Rear. Sherwood Gambler, MD  Fax: [phone]

## 2010-09-03 NOTE — Discharge Summary (Signed)
NAME:  Lisa Saunders, LOVICK NO.:  [id number]   MEDICAL RECORD NO.:  [id number]          PATIENT TYPE:  INP   LOCATION:  A339                          FACILITY:  [facility]   PHYSICIAN:  Dorris Singh, DO    DATE OF BIRTH:  01/28/1933   DATE OF ADMISSION:  04/19/2008  DATE OF DISCHARGE:  LH                               DISCHARGE SUMMARY   ADDENDUM   Patient seen today, notice in the chart.  There are no changes to her  current discharge summary  that was dated on April 25, 2008 by Dr.  Rito Ehrlich other than her discharge diagnosis:  Altered mental status  which is resolved; seizure episode, however, no repeat seizures and she  has not been on antiepileptics per neurologist; hypertension; rheumatoid  arthritis with right knee pain; gastroenteritis, which is resolved; and  hyponatremia, which is resolved.  There are no changes to her hospital  course, however, she continued to progress without any problems.  She  will be sent home on the same medications.  There are no changes to that  and we will send her to the nursing home.      Dorris Singh, DO  Electronically Signed     CB/MEDQ  D:  05/01/2008  T:  05/01/2008  Job:  [id number]

## 2010-09-03 NOTE — H&P (Signed)
NAME:  Lisa Saunders, NEACE NO.:  [id number]   MEDICAL RECORD NO.:  [id number]          PATIENT TYPE:  EMS   LOCATION:  ED                            FACILITY:  [facility]   PHYSICIAN:  Osvaldo Shipper, MD     DATE OF BIRTH:  05/03/1932   DATE OF ADMISSION:  04/19/2008  DATE OF DISCHARGE:  LH                              HISTORY & PHYSICAL   PRIMARY MEDICAL DOCTOR:  Dr. Artis Delay with [facility].   ADMITTING DIAGNOSES:  1. Query seizure episodes.  2. Altered mental status.  3. History of fibromyalgia and rheumatoid arthritis.  4. History of hypertension.  5. History of anxiety disorder.   CHIEF COMPLAINT:  Possible seizure.   HISTORY OF PRESENT ILLNESS:  The patient is a 77 year old Caucasian  female who was brought in by EMS after she was witnessed to have a  seizure-like episode this morning.  The patient is confused and is  unable to provide much history.  According to the nursing notes, she was  in the bathroom today when she got up from her commode and then  apparently had a seizure-like episode and passed out.  The patient, when  she came in to the hospital, complained of pain and headache.  She was  also very confused.  Had also a history of similar episode on December  26 when she went to [facility] and, at that time again, there was  the thought that the patient might have had a seizure episode.   It appears that the patient has been having diarrhea for the past at  least 2 weeks.  There has been mention of multiple stools on a daily  basis.  Unfortunately, no other history is available.  The patient  denies any nausea or vomiting or pain in the abdomen.  It was also noted  by the nursing staff that the patient's husband has been recently sent  to a skilled nursing facility because of recent falls, and the patient  might be more depressed.   It also appears that the patient was started on Darvocet on December 22  when she presented  to the ED here with complaints of anxiety.  She was  also started on nitrofurantoin on December 28 by her PMD for presumed  UTI.  She was also started on Lomotil by her PMD recently.   ALLERGIES:  MORPHINE.   MEDICATIONS AT HOME:  It appears she is on the following:  1. Xanax 1 mg 4 times daily as needed for anxiety.  2. Metoprolol 50 mg once a day.  3. Lasix 40 mg twice a day.  4. Potassium chloride unknown dose.  5. It is unclear if she is on Lexapro or not.  6. She is on nasal calcitonin 1 spray alternative nostril daily.  7. Chlordiazepoxide q.a.c. and at bedtime.  8. She was started on Darvocet on the 22nd.  9. Nitrofurantoin on the 28th of this month.  10.Lomotil recently.   PAST MEDICAL HISTORY:  Positive for back surgery, knee surgery,  cholecystectomy, hysterectomy.  She has a history of chronic chest  pain  and cardiac catheterization, et Karie Soda, have been negative.  EF last  noted is 45%.  She has a history of rheumatoid arthritis, fibromyalgia,  hypertension.   SOCIAL HISTORY:  She is a former Psychologist, occupational.  She retired many years ago.  No alcohol use.  No illicit drug use.  She walks with a cane.   FAMILY HISTORY:  Positive for rheumatoid arthritis.   REVIEW OF SYSTEMS:  Unable to do on this confused patient.   PHYSICAL EXAMINATION:  VITAL SIGNS:  Temperature 97.8, blood pressure  173/84, heart rate 100, respiratory rate 18, saturation 98%.  Orthostatics were attempted.  Blood pressure lying down was 147/78 with  a heart rate of 81.  When she sat up, her blood pressure was 140/100.  Heart rate went up to 117.  Standing up was 122/91 with a heart rate of  102.  GENERAL:  This is an elderly white female in no distress.  Very  confused.  HEENT:  There is no pallor.  No icterus.  Mucous membranes moist.  Pupils are equal and reacting.  NECK:  Soft and supple.  No thyromegaly is appreciated.  LUNGS:  Clear to auscultation bilaterally.  No wheeze, rales, or  rhonchi.   CARDIOVASCULAR:  S1, S2.  Normal regular.  No murmurs appreciated.  No  S3 or S4.  No rubs or bruits.  ABDOMEN:  Soft, nontender, nondistended.  Bowel sounds present.  No  masses or organomegaly appreciated.  RECTAL:  The rectal area was inspected and it showed excoriation of the  skin around the rectum.  EXTREMITIES:  No edema.  NEUROLOGIC:  She is alert, disoriented.  No focal deficits are present.   LABS:  Her white count is 12,500 with 87% neutrophils, hemoglobin 13.8,  platelet count is 345,000.  Sodium 129, potassium 3.9, chloride 94,  glucose 116, bicarb 25, BUN 9, creatinine 0.8.  Myoglobin is 223,  cardiac markers are negative.  Urinalysis has not been done.  She had an  EKG, which showed a sinus rhythm, intervals appear to be in the normal  range.  QT appears to be slightly prolonged, however.  No Q waves are  present.  T wave inversion in lead 3 is noted.  Otherwise, no other  acute changes are present.  There is also evidence for LVH based on aVL.   Other imaging studies include a CT of the cervical spine, which did not  show any acute findings.  Degenerative disease was noted.  CT brain was  done, which showed atrophy with small vessel chronic ischemic changes of  deep cerebellar white matter.  Chest x-ray did not show any acute  findings.  Hip films on the right side did not show any acute findings  as well.   ASSESSMENT:  This is a 77 year old Caucasian female who had a syncopal  episode with shaking according to reports and could have been a seizure-  type activity.  However, the patient does have almost a 1 to 2 week  history of diarrhea, and this episode that happened this morning  happened when she stood up from her commode.  It is likely that this  could have been an orthostatic episode.  The patient appears to be  slightly volume depleted because of her diarrhea.  This is the second  episode in the last 1 week.  She has hyponatremia, possibly from  hypovolemia.   She has mild leukocytosis.  The etiology of this is  unclear.   PLAN:  1. Possible syncopal episode related to orthostatic hypotension.      Cannot rule out seizure disorder.  Unfortunately, our neurologist      is on vacation until next week.  I will empirically start this      patient on Keppra, get an MRA of the brain, give her IV fluids, and      I am hoping if her confusion improves and she does not have any      more seizures, I will discharge her and have her follow up with Dr.      Gerilyn Pilgrim as an outpatient for an EEG and to determine if she      continues to require Keppra or not.  2. Altered mental status.  I think this is multifactorial.  I think      this is probably related to medication use in the form of Darvocet,      Lomotil.  I think that both of these medications could have caused      this confusion.  I will also check a UA to make sure there is no      UTI, and I will hold off these medications.  3. Diarrhea.  We will do stool studies.  We will use Imodium.  The      likelihood of this being clostridium difficile is pretty low.  4. Hyponatremia should correct with IV fluids.  5. Leukocytosis, etiology is unclear.  We will follow this closely.  6. History of hypertension and depression.  These are stable.  We will      hold off her antihypertensives and antidepressants for now until      her mental status improves.  7. Further management decisions will depend on the results of further      testing and the patient's response to treatment.      Osvaldo Shipper, MD  Electronically Signed     GK/MEDQ  D:  04/19/2008  T:  04/19/2008  Job:  [id number]   cc:   Madelin Rear. Sherwood Gambler, MD  Fax: [phone]

## 2010-09-06 ENCOUNTER — Ambulatory Visit (HOSPITAL_COMMUNITY): Payer: Medicare Other

## 2010-09-06 NOTE — H&P (Signed)
NAME:  Lisa Saunders, Lisa Saunders NO.:  [id number]   MEDICAL RECORD NO.:  [id number]          PATIENT TYPE:  INP   LOCATION:  3106                         FACILITY:  [facility]   PHYSICIAN:  Hewitt Shorts, M.D.DATE OF BIRTH:  02/15/1933   DATE OF ADMISSION:  03/03/2005  DATE OF DISCHARGE:                                HISTORY & PHYSICAL   HISTORY OF PRESENT ILLNESS:  Patient is a 77 year old right-handed white  female who was five weeks status post L4 Gill procedure and L4-5 posterior  lumbar interbody fusion with AVS peak interbody implants and VITOSS with  bone marrow aspirate and an L4-5 posterolateral arthrodesis with 90D  posterior instrumentation of VITOSS with bone marrow aspirate.   The patient had initially been doing well, but then developed progressively  worsening right lumbar radicular pain with pain in the right buttock and  into the right thigh.  She explains that at times the pain is in the  anterior aspect of the thigh and at other times in the posterior aspect of  the thigh.   The patient had been using Percocet for pain.  We added Lyrica 75 mg b.i.d.  Evaluation as an outpatient included x-rays and subsequently CT scan with  MRI scan.  These revealed subsidence of the interbody implant into the  anterior two-thirds of the superior end plate of L5 with a resulting  anterior listhesis of L4 and 5 which then tore out the right L4 pedicle  screw out of the vertebral body and pedicle.   It was felt that the underlying problem was that of osteoporosis and that  the right L4 and pedicle screw may well be irritating the existing nerve  roots.   The patient is admitted now for removal of the right-sided posterior  instrumentation including pedicle screws and rod with consideration of  intraspinous plating and consideration of further supplementation of the  fusion with Infuse.   Past medical history, family history, social history are unchanged from her  January 23, 2005 admission.   REVIEW OF SYSTEMS:  Notable for those described in history of present  illness and past medical history, but is otherwise unremarkable.   PHYSICAL EXAMINATION:  GENERAL:  Patient is a well-developed, well-nourished  white female in obvious discomfort.  VITAL SIGNS:  Temperature 97.4, pulse 81, blood pressure 185/94, respiratory  rate 18.  LUNGS:  Clear to auscultation.  She has symmetrical respiratory excursion.  HEART:  Regular rate and rhythm without S1, S2.  There is no murmur.  ABDOMEN:  Soft, nondistended.  Bowel sounds are present.  EXTREMITIES:  No clubbing, cyanosis, edema.  NEUROLOGIC:  5/5 strength in the distal lower extremities including  dorsiflexion, plantar flexion, extensor hallux longus.  Sensation is intact  to pin prick.  External examination shows a well healed lumbar incision.   IMPRESSION:  Right lumbar radicular pain secondary to disruption of L4-5  arthrodesis by subsidence of the interbody implant in the L4-5 disk space  into the superior end plate of L5 secondary to osteoporosis with resulting  anterior listhesis of L4 and 5 and disruption of the right-sided posterior  instrumentation.   PLAN:  Patient will be admitted for removal of the right-sided posterior  instrumentation and possible supplementation of the arthrodesis.  I  discussed the nature of her condition, options for treatment including  surgery and the nature of surgery to include risks of surgery including  risks of infection, bleeding, possible need for transfusion, the risk of  nerve dysfunction, pain, weakness, numbness, or paresthesias, the risk of  spinal fluid leakage, and possible need for further surgery, anesthetic  risks, myocardial infarction, stroke, pneumonia, and death.  Understanding  all this she does wish to proceed with surgery and is admitted for such.      Hewitt Shorts, M.D.  Electronically Signed     RWN/MEDQ  D:  03/10/2005  T:   03/11/2005  Job:  [id number]

## 2010-09-06 NOTE — Discharge Summary (Signed)
NAMEMarland Saunders  MALIN, SAMBRANO NO.:  [id number]   MEDICAL RECORD NO.:  [id number]          PATIENT TYPE:  INP   LOCATION:  3022                         FACILITY:  [facility]   PHYSICIAN:  Clydene Fake, M.D.  DATE OF BIRTH:  [birth year]   DATE OF ADMISSION:  01/23/2005  DATE OF DISCHARGE:  01/27/2005                                 DISCHARGE SUMMARY   DIAGNOSES:  1.  Lumbar stenosis.  2.  Spondylolisthesis.  3.  Spondylosis.  4.  Degenerative disk disease.  5.  Radiculopathy.   DISCHARGE DIAGNOSES:  1.  Lumbar stenosis.  2.  Spondylolisthesis.  3.  Spondylosis.  4.  Degenerative disk disease.  5.  Radiculopathy.   PROCEDURES:  L4, __________ with instrumentation and posterior lumbar  fusion.   REASON FOR ADMISSION:  This 77 year old woman has had pain in the back going  down the legs worse to the right side with some weakness in the right lower  extremity.  Found to have spondylolisthesis at L4-5, lumbar stenosis.  Patient was brought for decompression and fusion.   HOSPITAL COURSE:  The patient was admitted day of surgery and underwent  procedure above without complications.  Postoperatively the patient was  transferred to the recovery room to the floor where she did well.  She  started ambulating __________ improvement from preoperative.  Foley was  removed on October 7.  She started to increase her ambulation and doing  well.  She is eating well.  Had decrease pain.  Incision was clean, dry and  intact.  Discharged home in stable condition on 01/27/05.  She was voiding  well at that point.   DISCHARGE MEDICATIONS:  1.  Same as prior to hospitalization.  2.  Pain medicines.  3.  Muscle relaxers.   FOLLOW UP:  Follow up will be with Dr. Newell Coral in 3-4 weeks.   ACTIVITIES:  Activity with brace.  No strenuous activity.           ______________________________  Clydene Fake, M.D.     JRH/MEDQ  D:  04/24/2005  T:  04/24/2005  Job:  [id number]

## 2010-09-06 NOTE — Discharge Summary (Signed)
NAMEMAKAYAH, PAULI NO.:  [id number]   MEDICAL RECORD NO.:  [id number]          PATIENT TYPE:  INP   LOCATION:  3010                         FACILITY:  [facility]   PHYSICIAN:  Danae Orleans. Venetia Maxon, M.D.  DATE OF BIRTH:  31-Oct-1932   DATE OF ADMISSION:  03/03/2005  DATE OF DISCHARGE:  03/16/2005                                 DISCHARGE SUMMARY   REASON FOR ADMISSION/ADMISSION DIAGNOSIS:  L4 and L5 spondylolisthesis with  subsidence of L4-5 interbody implant into superior end-plate of L5 with  right lumbar radiculopathy.   FINAL DIAGNOSES:  1.  L4 and L5 spondylolisthesis with subsidence of L4-5 interbody implant      into superior end-plate of L5 with right lumbar radiculopathy.  2.  Delirium requiring Psychiatric intervention.  3.  Seizure.   PROCEDURES:  1.  Exploration of lumbar fusion.  2.  Removal of right posterior instrumentation.  3.  Right L4-5 foraminotomy.   HISTORY OF ILLNESS AND HOSPITAL COURSE:  Tyeesha Riker previously  underwent lumbar decompression and fusion, she had subsidence of her implant  with significant radiculopathy necessitating return to the hospital for  exploration of her fusion.  The patient had a seizure on March 04, 2005,  and then proceeded to have garbled speech.  It was initially felt that she  had had a stroke, however, subsequently it was felt that she did not have a  stroke.  She continued to have a significant delirium.  She had a CT of the  brain, which was normal, and it was felt that this a medication-induced  delirium.  The patient then was seen by a psychiatrist, who felt that she  had a delirium secondary to benzodiazepine withdrawal, and she was started  on a modified Ativan protocol to address possible untreated withdrawal.  The  patient gradually improved, and she was monitored closely in Intensive Care  and gradually had improvement in her sensorium.  She was still quite  confused and delirious as of the  22nd.  She gradually cleared, although on  the 23rd she thought she was in a bird factory. She gradually then cleared  and was much more appropriate on the 24th.  She was seen by the  psychiatrist, who felt that she was significantly improved as of that point.  As of the 26th, she was doing well and without significant complaints,  minimal back pain, and was discharged home on her pre-admission medications  and additionally was given a prescription for Percocet for pain.   DISCHARGE MEDICATIONS:  Additional medications at the time of discharge  included:  1.  Allegra-D 180 mg q.12h as needed.  2.  Metoprolol 50 mg, 2 a day.  3.  Lexapro 20 mg a day.  4.  Potassium chloride 20 mEq b.i.d.  5.  VoSpire-ER 4 mg, 2 per day.  6.  Xanax was discontinued.  7.  Bentyl 10 mg as needed  8.  Lasix 40 mg daily.  9.  Flonase 1 spray daily.  10. Albuterol inhaler as needed.  11. Advair 50/100, 1 in the morning and 1 in the evening.  12. Fosamax 70 mg weekly, every Friday.  13. Premarin 0.625 mg daily.  14. Ambien was discontinued.  15. Lyrica 75 mg b.i.d.  16. Percocet one 3 or 4 times daily as needed for pain.  17. Fortical 2200 international units spray every other day in each nostril.   DISCHARGE INSTRUCTIONS:  1.  No lifting, bending, twisting, or driving.  2.  Follow up with Dr. Newell Coral in his office in two weeks.      Danae Orleans. Venetia Maxon, M.D.  Electronically Signed     JDS/MEDQ  D:  03/16/2005  T:  03/16/2005  Job:  [id number]

## 2010-09-06 NOTE — Discharge Summary (Signed)
Lisa Saunders, BERGERSON NO.:  [id number]   MEDICAL RECORD NO.:  [id number]          PATIENT TYPE:  INP   LOCATION:  A203                          FACILITY:  [facility]   PHYSICIAN:  Corrie Mckusick, M.D.  DATE OF BIRTH:  28-Jun-1932   DATE OF ADMISSION:  05/18/2004  DATE OF DISCHARGE:  01/31/2006LH                                 DISCHARGE SUMMARY   DISCHARGE DIAGNOSIS:  Chronic obstructive pulmonary disease exacerbation.   HISTORY OF PRESENT ILLNESS:   PAST MEDICAL HISTORY:  Please see admitting H&P.   HOSPITAL COURSE:  A 77 year old female with fibromyalgia, depression,  osteoporosis, COPD, mitral valve prolapse, and irritable bowel who presented  with three-four days of progressive cough and shortness of breath.  She is  admitted for close monitoring as well as aggressive nebulizer treatment.  She was double covered with Rocephin and Azithromycin.  She was given  Xopenex and Atrovent nebulizers with great improvement.  No prednisone was  given as she reacts.  She improved quickly over the subsequent days.   On May 20, 2004 she felt nearly ready to go home.  Her fluids were  discontinued.  Her nebulizers and IV antibiotics were continued and she was  ready for discharge on May 21, 2004.   DISCHARGE PHYSICAL EXAMINATION:  VITAL SIGNS:  T-max of 97.0, blood pressure  ranged from 112-150 systolic to 63-84 diastolic, heart rate in the 70s to  90s, respiratory rate 18-20, O2 saturation was 95-97% on room air.  GENERAL:  When I saw her she was pleasant, talking, joking, and back to her  normal state of health in no acute distress.  HEENT:  Nasal and oropharynx were clear.  NECK:  Supple.  No lymphadenopathy.  CHEST:  Clear to auscultation bilaterally.  No wheezes, no rhonchi.  CARDIOVASCULAR:  Regular rate and rhythm.  Normal S1 S2.  EXTREMITIES:  No cyanosis, clubbing, or edema.   DISCHARGE MEDICATIONS:  Same as admission with the addition of:  1.  Levaquin  500 mg p.o. daily for six additional days.  Otherwise no change in medications.   FOLLOW UP:  She is to follow up at [facility] in one-two weeks or sooner if  need be.      JCG/MEDQ  D:  05/21/2004  T:  05/21/2004  Job:  [id number]

## 2010-09-06 NOTE — Procedures (Signed)
EEG NUMBER:  [id number]   PROCEDURE:  Portable EEG   HISTORY:  This is a 77 year old with questionable seizures. The patient is  having an EEG done to evaluate for seizure activity.   TECHNICAL DESCRIPTION:  Throughout this routine EEG there appears to be a  posterior dominant rhythm of 8-9 Hz activity at 15-30 microvolts. The  background activity is symmetric and mostly comprised of theta range  activity with occasional alpha range activity at 20-30 microvolts.  Throughout the recording there is a tremendous amount of EMG/muscle then  movement artifact that occasionally obscures the background. There is also  noted at times to be electrode artifact as well. Photic stimulation nor  hyperventilation were performed throughout this recording. The patient does  not go to sleep during this tracing. Throughout this record there is no  definitive electrographic seizures or interictal discharge activity noted.   IMPRESSION:  This routine EEG is mildly abnormal secondary to diffuse  background slowing. This slowing is suggestive of a toxic metabolic or  primary neuronal disorder. Clinical correlation is advised.           ______________________________  Bevelyn Buckles. Nash Shearer, M.D.     VHQ:IONG  D:  03/05/2005 11:04:12  T:  03/05/2005 11:23:56  Job #:  [id number]

## 2010-09-06 NOTE — H&P (Signed)
NAME:  Lisa Saunders, Lisa Saunders NO.:  [id number]   MEDICAL RECORD NO.:  [id number]                   PATIENT TYPE:  OUT   LOCATION:  NUC                                  FACILITY:  [facility]   PHYSICIAN:  Darlin Priestly, M.D.             DATE OF BIRTH:  [birth year]   DATE OF ADMISSION:  03/03/2003  DATE OF DISCHARGE:  03/03/2003                                HISTORY & PHYSICAL   HISTORY OF PRESENT ILLNESS:  [name] is a 77 year old female  patient of Dr. Delila Spence in [place], [place], who came to the  cardiac catheterization area for cardiac catheterization.  She has had  previous episodes of chest pain.  She had a nondiagnostic stress test done  on September 22, 2002.  It was an exercise test.  She did not achieve her maximum  predicted heart rate.  The images were negative for any ischemia.  Her  ejection fraction was 62%.  She has also had a 2-D echocardiogram done which  showed borderline LVH with normal LV size and systolic function.  The  inferior wall was notable for hypokinesis.  She also wore a Holter monitor  which revealed predominantly sinus rhythm with episodes of sinus  bradycardia.  She did have six runs of brief, nonsustained supraventricular  tachycardia which could have AVNRT or atrial fibrillation with a rapid  ventricular response.   She continued to have chest pain complaints, and she came in for a cardiac  catheterization on November 22, 2002.   PRIOR MEDICAL HISTORY:  1. Fibromyalgia.  2. Depression.  3. Osteoporosis.  4. Chronic obstructive pulmonary disease.  5. Mitral valve prolapse.  6. Irritable bowel syndrome.   PAST SURGICAL HISTORY:  1. Total abdominal hysterectomy.  2. Bilateral salpingo-oophorectomy.  3. Back surgery.  4. Appendectomy.  5. Tonsillectomy.   ALLERGIES:  Procardia causes swelling.   MEDICATIONS:  1. Propranolol 160 mg every day.  2. Prozac 20 mg every day.  3. Fosamax 70 mg every  week.  4. Ambien 4 mg at bedtime.  5. Hydrocodone p.r.n.  6. Flonase spray p.r.n.  7. Albuterol inhaler p.r.n.  8. Azmacort two puffs b.i.d.  9. Clarinex p.r.n.  10.      Hyzaar 50/12.5 every day.  11.      Meprobamate 400 mg at bedtime.  12.      Nystatin.  13.      Triamcinolone CR t.i.d.   FAMILY HISTORY:  Positive for rheumatoid arthritis and premature coronary  artery disease.  Her father died of a myocardial infarction at the age of  26.   SOCIAL HISTORY:  She smoked remotely.  No alcohol.  She is married and has  two children.  She is retired from [employer].   REVIEW OF SYSTEMS:  She denied any current chest pain or shortness of  breath.  No syncope.  No presyncope.  Occasional palpitations.  Some  abdominal pain secondary to her irritable bowel syndrome.  No falls, no  fever, no cough.  No dysuria.  No black tarry stools.  Other systems are  negative.   PHYSICAL EXAMINATION:  VITAL SIGNS:  Heart rate 63, respirations 18, blood  pressure 143/69, temperature 96.9.  Height 5 feet, 4 inches.  Weight 181.  GENERAL:  In no acute distress.  LUNGS:  Clear to auscultation bilaterally.  HEART:  Sounds regular.  No murmur noted.  Bowel sounds are present x4.  No  hepatosplenomegaly.  No bruits.  EXTREMITIES:  Lower extremities are negative for edema.  Positive  varicosities.  Positive dorsalis and pedis pulses.   ASSESSMENT:  1. Chest pain.  2. Negative exercise stress test.  However, predicted maximum heart rate was     not reached.  3. Chronic obstructive pulmonary disease.  4. Fibromyalgia.  5. Depression.  6. Osteoporosis.  7. History of mitral valve prolapse.  8. Multiple surgeries.  9. History of premature family history of coronary artery disease.  10.      Irritable bowel syndrome.   PLAN:  1. She is here to undergo cardiac catheterization with Dr. Lenise Herald.  2. The procedure has been explained by Dr. Domingo Sep.      Lezlie Octave, N.P.                         Darlin Priestly, M.D.    BB/MEDQ  D:  05/31/2003  T:  05/31/2003  Job:  [id number]

## 2010-09-06 NOTE — Cardiovascular Report (Signed)
NAME:  Lisa Saunders, GRUETZMACHER NO.:  [id number]   MEDICAL RECORD NO.:  [id number]                   PATIENT TYPE:  OIB   LOCATION:  2852                                 FACILITY:  [facility]   PHYSICIAN:  Darlin Priestly, M.D.             DATE OF BIRTH:  02/08/1933   DATE OF PROCEDURE:  11/22/2002  DATE OF DISCHARGE:                              CARDIAC CATHETERIZATION   PROCEDURES PERFORMED:  1. Right heart catheterization.  2. Coronary angiography.  3. Left ventriculogram.  4. Abdominal aortogram.   COMPLICATIONS:  None.   INDICATIONS:  [name] is a 77 year old female patient of Dr. Artis Delay and Dr. Kem Boroughs with a history of hypertension, fibromyalgia,  depression, history of palpitations, status post 2D echocardiogram with  normal LV function with mitral valve prolapse.  The patient did have a  Holter monitor revealing episodes of nonsustained SVT.  She also had a  Cardiolite scan revealing no significant ischemia.  She continued to have  ongoing symptoms and is now referred for cardiac catheterization for  definitive diagnosis of CAD.   DESCRIPTION OF PROCEDURE:  1. After obtained informed consent, the patient was brought to the cardiac     catheterization laboratory.  She was prepped and draped in the usual     sterile fashion.  EGD monitor was established.  Using the Seldinger     technique, a number 6-Frech arterial sheath was inserted into the right     femoral artery.  A 6-French Judkins catheter was used to perform     diagnostic angiography.  This revealed a large left main with no     significant disease.  LAD was a large vessel that coursed to the apex     into three diagonal branches.  The LAD has no significant disease.  The     first and second diagonals were medium sized vessels with no significant     disease.  The third diagonal was a small vessel with no significant     disease.  2. The circumflex was a large vessel  coursing at the apex into two obtuse     marginal branches.  The AV circumflex has no significant disease.  The     first and second OMs are large vessels with no significant disease.  3. The right coronary artery is a large vessel that is dominant and gives     rise to PDA at posterior lateral branch.  There is no significant disease     in the RCA, PDA, or posterior lateral branch.  4. Left ventriculogram reveals mildly depressed EF at 40-45% with mild     global hypokinesis.  There is evidence of LVH.  5. Abdominal aortogram reveals no evidence of enlarged stenosis.   HEMODYNAMIC DATA:  1. Systemic arterial pressure 188/91.  2. Left ventricle systemic pressure 185/18.  3. LVEDP 31.   CONCLUSIONS:  1. No significant  coronary artery disease.  2. Mildly depressed left ventricular systolic function.  3.     No evidence of renal artery stenosis.  4. Systemic hypertension.  5. Elevated left ventricular end-diastolic pressure.                                               Darlin Priestly, M.D.    RHM/MEDQ  D:  11/22/2002  T:  11/22/2002  Job:  [id number]

## 2010-09-06 NOTE — H&P (Signed)
NAME:  Lisa Saunders, OWUSU NO.:  [id number]   MEDICAL RECORD NO.:  [id number]          PATIENT TYPE:  INP   LOCATION:  3022                         FACILITY:  [facility]   PHYSICIAN:  Hewitt Shorts, M.D.DATE OF BIRTH:  08-04-1932   DATE OF ADMISSION:  01/23/2005  DATE OF DISCHARGE:                                HISTORY & PHYSICAL   HISTORY OF PRESENT ILLNESS:  The patient is a 77 year old right-handed white  female who has been a patient of mine since 68. She is status post a right  L3-4 lumbar laminotomy and discectomy in November 1990. She returned a year  ago because of low back pain radiating down to the right buttock and  posterior thigh. She had some pain to the left side but worse to the right  with numbness in the right foot. She had a sense of weakness in the right  lower extremity. The patient has found the pain and discomfort to  continually become more and more disabling to her day-to-day life and has  elected to go ahead with surgical decompression.   X-rays and MRI scan revealed a grade 1 degenerative spondylolisthesis L4 and  L5 with lumbar stenosis secondary to facet arthropathy and hypertrophy and  the patient is now admitted for decompression and arthrodesis.   PAST MEDICAL HISTORY:  Notable for history of hypertension and asthma. No  history of myocardial infarction, cancer, stroke, diabetes, peptic ulcer  disease. Previous surgical history of hysterectomy in 1977, right L3-4  discectomy in November 1990.   Reports an allergy to [facility].   CURRENT MEDICATIONS:  1.  Lexapro 20 mg q.a.m.  2.  Xanax 1 mg q.i.d.  3.  Lasix 40 mg q.a.m.  4.  Diovan 160 mg q.a.m.  5.  Vicodin p.r.n. for pain.  6.  Nexium 40 mg b.i.d.  7.  Flonase two sprays b.i.d.  8.  Albuterol two puffs p.r.n.  9.  Advair two sprays b.i.d.  10. Ambien q.h.s.  11. Metoprolol 50 mg q.12h.  12. Fosamax 70 mg every Friday.  13. Aspirin 81 mg q.a.m.   FAMILY HISTORY:  Her  parents have passed on. Her mother died of heart  failure at age 57. Father had a heart attack at age 54 that he died from.   SOCIAL HISTORY:  The patient is married, she is retired. She does not smoke,  drink alcoholic beverages, or have history of substance abuse.   REVIEW OF SYSTEMS:  Notable for those difficulties described in the history  of present illness and past medical history but is otherwise unremarkable.   PHYSICAL EXAMINATION:  GENERAL:  The patient is a well-developed, well-  nourished white female in no acute distress.  VITAL SIGNS:  Her temperature is 97.0, pulse 66, blood pressure 156/81,  respiratory rate 16, height 5 feet 2 inches, weight 177 pounds.  LUNGS:  Clear to auscultation. She has symmetrical respiratory excursion.  HEART:  Regular rate and rhythm, normal S1 and S2. There is no murmur.  ABDOMEN:  Soft, nondistended, bowel sounds are present.  EXTREMITIES:  Show no clubbing, cyanosis, or edema.  MUSCULOSKELETAL:  Shows no discomfort to palpation in the lumbar region.  Mobility, though, is limited by discomfort.  NEUROLOGIC:  Shows 5/5 strength in the distal left lower extremity including  dorsiflexor, plantar flexor, and extensor hallucis longus. In the distal  right lower extremity she is unable to exert full effort due to discomfort  with dorsiflexor, plantar flexor, and extensor hallucis longus, and it is  difficult to say whether there are any actual motor deficits. Sensation is  intact to pinprick to the distal lower extremities. Reflexes:  The left  quadriceps is 1-2, the right is absent, gastrocnemius are minimal  bilaterally, toes are downgoing bilaterally. Her gait and stance both favor  the right lower extremity.   IMPRESSION:  Patient with low back pain, bilateral radicular discomfort  right worse than left. She has lumbar stenosis, degenerative  spondylolisthesis grade 1 at L4-5, and facet arthropathy.   PLAN:  The patient will be admitted for  an L4 Gill procedure and L4-5  posterior lumbar interbody fusion with interbody implants and an L4-5  posterolateral arthrodesis with posterior instrumentation and bone graft. We  discussed the nature of surgery; typical length of surgery, hospital stay,  and overall recuperation; limitations postoperatively; need for  postoperative immobilization in a lumbar corset; and alternatives to the  surgery. We also discussed the risks of surgery including the risk of  infection; bleeding; possible need for transfusion; the risk of nerve  dysfunction with pain, weakness, numbness, or paresthesias; the risk of  dural tear and CSF leak with possible need for further surgery; risk of  failure of the arthrodesis and possible need for further surgery; and  anesthetic risks of myocardial infarction, stroke, pneumonia, and death. She  understands that we will use a CellSaver during the surgery. Understanding  all this, she wishes to proceed with surgery and is admitted for such.      Hewitt Shorts, M.D.  Electronically Signed     RWN/MEDQ  D:  01/23/2005  T:  01/24/2005  Job:  [id number]

## 2010-09-06 NOTE — Consult Note (Signed)
NAMEMATASHA, SMIGELSKI NO.:  [id number]   MEDICAL RECORD NO.:  [id number]          PATIENT TYPE:  INP   LOCATION:  2311                         FACILITY:  [facility]   PHYSICIAN:  Gustavus Messing. Orlin Hilding, M.D.DATE OF BIRTH:  1932-12-08   DATE OF CONSULTATION:  03/05/2005  DATE OF DISCHARGE:                                   CONSULTATION   CONSULTING PHYSICIAN:  Santina Evans A. Orlin Hilding, M.D.   CHIEF COMPLAINT:  Code stroke and seizure.  Code called at 0007.   HISTORY OF PRESENT ILLNESS:  Ms. Bacot is a 77 year old woman who was  admitted to the hospital for revision of a lumbar fusion two days ago.  She  was described as being a little bit confused earlier today.  She had her  surgery on March 03, 2005.  She was given some medicines tonight which  included Percocet, Xanax, and Ambien and then apparently had a 30-second  seizure where she had a tonic episode dropped her cup, was stiff, and shook  all over, and was foaming at the mouth.  First, a code blue was called and  then a code stroke was called because she apparently had garbled speech.  Basically, she was mumbling somewhat after the seizure.  She was described  as being unresponsive during the event.  She has made a reasonable recovery  except remains somewhat confused.   REVIEW OF SYSTEMS:  She complained of some epigastric pain which was  relieved by eructation.   PAST MEDICAL HISTORY:  1.  Multiple back surgeries with this recent revision two days ago.  2.  Remote tonsillectomy.  3.  Remote hysterectomy.  4.  Hypertension.  5.  Asthma.  6.  History of anemia.  7.  History of cystitis with MRSA in the urine.  8.  History of anxiety and depression.   I do not know how much Xanax her benzos she really takes at home.   Medications here are numerous p.r.n. medicines including Dilaudid, Toradol,  Advair, Claritin, Sudafed, Lopressor, Lexapro, potassium, Ventolin, Xanax,  Lasix, Nasonex, Lyrica, calcitonin,  Fosamax, Percocet, Atarax, Ambien,  morphine, Bentyl, Premarin, albuterol.  Half of those drugs are routine  dosed and half of them are p.r.n.   ALLERGIES:  1.  NIFEDIPINE.  2.  PREDNISONE.  3.  LATEX.   SOCIAL HISTORY:  She is married.   FAMILY HISTORY:  Noncontributory.   PHYSICAL EXAMINATION:  VITAL SIGNS:  Pulse is 85, BP 160/63, respirations  25, 98% sat on 2 liters.  HEAD:  Normocephalic, atraumatic.  NEUROLOGIC:  She is awake and alert.  She acts intoxicated.  She is a little  bit goofy but oriented precisely to date.  She knows she is in the hospital,  thought she was in the [facility] ER and seems to have forgotten that she  just had surgery.  However, she names objects, repeats and follows commands.  Her cranial nerve exam:  Pupils are dilated but reactive and symmetric.  Visual fields are full.  Extraocular movements are intact.  Facial sensation  is normal.  Facial motor activity is normal.  Hearing  is intact.  Palate is  symmetric.  Tongue is midline.  She is mumbling slightly, not truly  dysarthric.  On motor exam:  Upper extremities are normal with good grips,  no drift.  Lower extremities drift profoundly to the bed yet she can bend  her knee and can even sit on the edge of the bed, stand and walk a few steps  with minimal assistance, so I think some of this is cooperation and effort  related.  Deep tendon reflexes are diminished.  Downgoing toes.  Coordination:  Finger-to-nose is intact.  She would not do heel-to-shin.  Sensory exam is intact.   CT of the head was done and showed no acute abnormalities but does show some  old small vessel disease.   IMPRESSION:  Questionable seizure.  I do not know if this is an acute  medical intoxication, possibly could be withdrawal syndrome if she takes a  lot of benzodiazepines at home.  There is very scanty history available in  the chart.  There is no history and physical for example.  I do not think  she has had a  stroke.   RECOMMENDATIONS:  1.  We will check an EEG.  2.  Hold off on anticonvulsants for now.  If a seizure recurs we will load      her with Dilantin.      Catherine A. Orlin Hilding, M.D.  Electronically Signed     CAW/MEDQ  D:  03/05/2005  T:  03/05/2005  Job:  [id number]

## 2010-09-06 NOTE — Op Note (Signed)
NAME:  Lisa Saunders, Lisa Saunders NO.:  [id number]   MEDICAL RECORD NO.:  [id number]          PATIENT TYPE:  INP   LOCATION:  3019                         FACILITY:  [facility]   PHYSICIAN:  Hewitt Shorts, M.D.DATE OF BIRTH:  11/23/1932   DATE OF PROCEDURE:  03/03/2005  DATE OF DISCHARGE:                                 OPERATIVE REPORT   PREOPERATIVE DIAGNOSIS:  Subsidance of interbody implant into the superior  end plate of L5.  Anterolisthesis of L4 on 5 and right lumbar radiculopathy.   POSTOPERATIVE DIAGNOSIS:  Subsidance of interbody implant into the superior  end plate of L5.  Anterolisthesis of L4 on 5 and right lumbar radiculopathy.   OPERATION PERFORMED:  Exploration of lumbar fusion, removal of right-sided  90 D posterior instrumentation and right L4-5 lumbar foraminotomy with  microdissection.   SURGEON:  Hewitt Shorts, M.D.   ASSISTANT:  Danae Orleans. Venetia Maxon, M.D.   ANESTHESIA:  General endotracheal.   INDICATIONS FOR PROCEDURE:  The patient is a 77 year old woman who is about  six weeks status post a bilateral L4-5 lumbar decompression PLIF with PEEK  interbody implants and posterolateral arthrodesis with 90 D posterior  instrumentation and Vitoss.  The patient had been doing well but then  developed right lumbar radiculopathy.  MRI and CT were obtained and revealed  subsidence of the interbody implant into the anterior two thirds of the L5  superior end plate with resulting listhesis with L4 relative to L5 and with  disruption of the screws out of the pedicles of the vertebral body on the  right side as well as L4 and L5.  Decision was made to proceed with  exploration of the fusion, removal of the right-sided posterior  instrumentation, decompression of the neural elements, possible interspinous  plating with Aspire plate.   DESCRIPTION OF PROCEDURE:  The patient was brought to the operating room and  placed under general endotracheal anesthesia.  The  patient was turned to a  prone position.  Lumbar region was prepped with Betadine soap and solution  and draped in sterile fashion.  The previous midline incision was marked.  X-  ray was taken.  The level of the fusion identified and then a midline  incision was made through the previous midline incision.  Bipolar cautery  and electrocautery were used to maintain hemostasis.  Dissection was carried  down to the lumbar fascia which was incised on the right side of the midline  and the paraspinal muscles were carefully dissected from the spinous process  and lamina and retracted laterally.  We identified the right sided posterior  instrumentation.  The locking caps were loosened and removed and then the  rod was removed and then both screws were removed.  It appeared that there  was fracture of the remaining portion of the superior articular process of  L5 on the right side.  We carefully exposed the exiting right L4 nerve root  and decompressed the loose fragments of bone so that they would not irritate  the existing L5 nerve root.  It was felt that a good foraminotomy had been  performed for the exiting right L4 nerve root.  This dissection and  decompression was performed using the operating microscope and done using  microdissection and microsurgical technique.  We then examined the spinous  process at L4 and L5. There was disruption with fracture of the L4 spinous  process and therefore it was felt that we could not place a Spire plate.  We  therefore irrigated the wound extensively with bacitracin solution, checked  for hemostasis  which established with the use of bipolar cautery as well as  Gelfoam soaked in thrombin.  We did remove all the Gelfoam prior to closure.  We then proceeded with closure.  The deep fascia was closed with interrupted  #1 undyed Vicryl sutures, the deep subcutaneous fascia was closed with  interrupted inverted #1 undyed Vicryl sutures and subcutaneous and   subcuticular layer were closed with interrupted inverted 2-0 undyed Vicryl  sutures and skin edges closed with Dermabond.  The patient tolerated the  procedure well.  The estimated blood loss for this procedure was 100 mL.  Sponge, needle and instrument counts were correct.  Following surgery the  patient was turned back to supine position to be reversed from anesthetic,  to be extubated and transferred to recovery room for further care.      Hewitt Shorts, M.D.  Electronically Signed     RWN/MEDQ  D:  03/03/2005  T:  03/04/2005  Job:  [id number]

## 2010-09-06 NOTE — Op Note (Signed)
NAME:  Lisa Saunders, Lisa Saunders NO.:  [id number]   MEDICAL RECORD NO.:  [id number]          PATIENT TYPE:  INP   LOCATION:  3022                         FACILITY:  [facility]   PHYSICIAN:  Hewitt Shorts, M.D.DATE OF BIRTH:  16-Feb-1933   DATE OF PROCEDURE:  01/23/2005  DATE OF DISCHARGE:                                 OPERATIVE REPORT   PREOPERATIVE DIAGNOSIS:  Lumbar stenosis, lumbar degenerative  spondylolisthesis, lumbar spondylosis, degenerative disease and  radiculopathy.   POSTOPERATIVE DIAGNOSIS:  Lumbar stenosis, lumbar degenerative  spondylolisthesis, lumbar spondylosis, degenerative disease and  radiculopathy.   PROCEDURE:  L4 Gill procedure, L4-5 posterior lumbar interbody fusion with  AVS PEEK interbody implants of VITOSS with bone marrow aspirate and L4-5  posterolateral arthrodesis with 90-degree posterior instrumentation and  VITOSS with bone marrow aspirate with microdissection.   SURGEON:  Hewitt Shorts, M.D.   ASSISTANT:  Clydene Fake, M.D.   ANESTHESIA:  General endotracheal.   INDICATION:  The patient is a 77 year old woman who presented with lumbar  radiculopathy primarily to the right side with associated low back pain.  She had a dynamic degenerative grade 1 spondylolisthesis at L4-5 with  associated lumbar stenosis.  There was advanced facet arthropathy,  degenerative disk bulging and resulting radiculopathy.  Decision was made to  proceed with decompression and arthrodesis.   PROCEDURE:  The patient was brought to the operating room and placed under  general endotracheal anesthesia.  The patient was turned to a prone position  and the lumbar region was prepped with Betadine soap and solution, and  draped in a sterile fashion.  The midline was infiltrated with a local  anesthetic with epinephrine.  A midline incision was made and carried down  through the subcutaneous tissue.  Bipolar cautery and electrocautery were  used to  maintain hemostasis.  Dissection was carried down to the lumbar  fascia, which was incised bilaterally and the paraspinal muscles were  dissected revealing the spinous process and lamina in a subperiosteal  fashion.  X-rays were taken and the L4-5 spinous process and lamina in the  inferior laminar interspinous space identified and dissection was then  carried out laterally over the hypertrophic facets, exposing the transverse  processes of both L4 and L5 bilaterally.  The microscope was then draped and  brought into the field to provide additional magnification, illumination and  visualization and the decompression was performed using microdissection and  microsurgical technique.  Using the [facility] drill and Kerrison punches,  bilateral laminotomy and facetectomy were performed.  There was marked facet  arthropathy and hypertrophy, and this was removed.  The ligament flavum was  thickened and this was carefully removed and exposed the thecal sac and we  identified the L4 and L5 nerve roots bilaterally.  We then explored the  epidural space, coagulating epidural veins and dividing them as needed.  We  identified the annulus of the L4-5 disk and proceed with bilateral  diskectomy, removing spondylitic overgrowth from the posterior aspects of  both L4 and L5.  We incised the annulus and performed the diskectomy with a  variety of curettes  and pituitary rongeurs.  A thorough diskectomy was  performed and then we prepared the endplates for interbody arthrodesis,  removing the cartilaginous endplates.  We measured the height of the  intervertebral disk space and selected a 9-mm implant for the right side and  10-mm implant for the left side.  We used 20-mm-in-depth PEEK implants.  We  then identified the pedicle entry site for the right L5 pedicle.  The  overlying cortex was perforated and the pedicle probed, and then bone marrow  was aspirated and injected over a 10-mL strip of VITOSS and once the  VITOSS  was saturated, we packed the interbody implants with VITOSS with bone marrow  aspirate and positioned the interbody implants in the intervertebral disk  space, gently retracting the thecal sac and nerve root to protect them.  We  placed a 9-mm implant on the right and a 10-mm implant on the left; both  were countersunk.  We then brought in the C-arm fluoroscope to place the  pedicle screws bilaterally at L4 and L5.  Using the C-arm fluoroscope in a  lateral projection, we identified the pedicle entry sites and probed each of  the pedicles that were examined with a ball probe and no cutouts were found.  We then tapped each of the pedicles with a 5.25 tap, again examined them  with a ball probe; again, no cutouts were found and good threading was  found.  We then placed 6.75 x 40-mm screws bilaterally at each level.  Once  all 4 screws were in place, we selected a 60-mm rod; it was cut in half and  we placed a half of the rod on each side they were secured with locking caps  and final tightening was done.  We then again exposed the transverse process  of L4 and L5; they were decorticated and we packed additional VITOSS with  bone marrow aspirate over the transverse process and intertransverse gutter.  We then proceeded with closure.  The wound had been irrigated numerous times  throughout the procedure with bacitracin solution.  The deep fascia was  closed with interrupted undyed 1 Vicryl suture, the Scarpa fascia was closed  with interrupted undyed 1 Vicryl sutures, the subcutaneous and subcuticular  layer closed with interrupted and inverted 2-0 undyed Vicryl sutures and the  skin was reapproximated with surgical staples.  The wound was dressed with  Adaptic and sterile gauze.  The procedure was tolerated well.  Following  surgery, the patient was to be turned back to a supine position, reversed from the anesthetic, extubated, and transferred to the recovery room for  further  care.      Hewitt Shorts, M.D.  Electronically Signed     RWN/MEDQ  D:  01/23/2005  T:  01/24/2005  Job:  [id number]

## 2010-09-06 NOTE — H&P (Signed)
NAMESHOLONDA, JOBST NO.:  [id number]   MEDICAL RECORD NO.:  [id number]          PATIENT TYPE:  INP   LOCATION:  A203                          FACILITY:  [facility]   PHYSICIAN:  Corrie Mckusick, M.D.  DATE OF BIRTH:  October 03, 1932   DATE OF ADMISSION:  05/18/2004  DATE OF DISCHARGE:  LH                                HISTORY & PHYSICAL   ADMITTING DIAGNOSES:  Chronic obstructive pulmonary disease exacerbation,  bronchitis.   HISTORY OF PRESENT ILLNESS:  A 77 year old female with fibromyalgia,  depression, osteoporosis, chronic obstructive pulmonary disease, mitral  valve prolapse, irritable bowel, who presented with three to four days of  progressive cough and mild shortness of breath.  She came to the office, got  antibiotics, but did not improve.  She came to the emergency department,  needed nebulizer treatments to really clear her chest.  She was not hypoxic  in the emergency department with an O2 saturation of 91 and 90% on room air.  She is not a CO2 retainer as far as we can tell.  She is really improved  with the nebulizer treatment, and will be admitted to the hospital for  further aggressive treatment.   Cardiovascular, GI and GU review of systems are all negative.  Otherwise,  she is doing quite well from her other medical problems standpoint.  Her  fibromyalgia is also quite stable.   PAST MEDICAL HISTORY:  1.  Fibromyalgia.  2.  Depression.  3.  Osteoporosis.  4.  Chronic obstructive pulmonary disease.  5.  Mitral valve prolapse.  6.  Irritable bowel syndrome.   PAST SURGICAL HISTORY:  1.  Total abdominal hysterectomy.  2.  Back surgery.  3.  Appendectomy.  4.  Tonsillectomy.   ALLERGIES:  PROCARDIA causing swelling.   MEDICATIONS ON ADMISSION:  1.  Metoprolol 50 p.o. b.i.d.  2.  Diovan 160 p.o. daily.  3.  Allegra D, b.i.d.  4.  Xanax 1 mg p.o. t.i.d. p.r.n.  5.  Prozac 40 mg p.o. daily.  6.  Lorcet 7.5 q.4 h. p.r.n.  7.  Ambien 2 mg  q.h.s. p.r.n.  8.  Flonase b.i.d.  9.  Advair 100/50 b.i.d.  10. Aspirin 81 daily.  11. Lovastatin 10 mg q.h.s.  12. Lasix 40 mg p.o. b.i.d.  13. Potassium 20 mEq p.o. daily.  14. Benzoate 200 mg p.o. daily.  15. Spiriva daily.   SOCIAL HISTORY:  Noncontributory.   FAMILY HISTORY:  Noncontributory.   PHYSICAL EXAMINATION:  VITAL SIGNS:  Temperature 97.0, blood pressure  119/75, pulse 79, respirations 16, O2 saturation 94% on room air.  GENERAL:  A pleasant, talkative female, in no acute distress.  HEENT:  Nasopharynx is clear.  NECK:  Supple, no lymphadenopathy, no JVD.  CHEST:  Clear to auscultation bilaterally with just some mild upper rhonchi.  CARDIOVASCULAR:  Regular rate and rhythm, normal S1 and S2.  No S3, murmur,  gallops or rubs.  ABDOMEN:  Soft, nontender, nondistended.  EXTREMITIES:  No cyanosis, clubbing, no edema.   LABORATORY DATA:  Please see flow sheet for details.  CT of the  chest  overall negative for any acute injuries as well as the EKG no changes.   ASSESSMENT:  A 77 year old female with the above-stated medical problems who  presents with chronic obstructive pulmonary disease exacerbation.   PLAN:  1.  Admit for close monitoring and aggressive treatment.  2.  Double cover with Rocephin 1 gram and azithromycin 500 mg daily.  3.  Xopenex and Atrovent nebulizations q.i.d.  4.  Hold on prednisone as she does not like to take it.  5.  Continue her current medications, and we will follow closely.      JCG/MEDQ  D:  05/19/2004  T:  05/19/2004  Job:  [id number]

## 2010-09-11 ENCOUNTER — Other Ambulatory Visit (HOSPITAL_COMMUNITY): Payer: Self-pay | Admitting: Internal Medicine

## 2010-09-11 DIAGNOSIS — R51 Headache: Secondary | ICD-10-CM

## 2010-09-11 DIAGNOSIS — J019 Acute sinusitis, unspecified: Secondary | ICD-10-CM

## 2010-09-13 ENCOUNTER — Other Ambulatory Visit (HOSPITAL_COMMUNITY): Payer: Self-pay | Admitting: Internal Medicine

## 2010-09-13 ENCOUNTER — Ambulatory Visit (HOSPITAL_COMMUNITY): Admission: RE | Admit: 2010-09-13 | Payer: Medicare Other | Source: Ambulatory Visit

## 2010-09-13 ENCOUNTER — Other Ambulatory Visit (HOSPITAL_COMMUNITY): Payer: Medicare Other

## 2010-09-13 DIAGNOSIS — R51 Headache: Secondary | ICD-10-CM

## 2010-09-13 DIAGNOSIS — J019 Acute sinusitis, unspecified: Secondary | ICD-10-CM

## 2010-09-20 ENCOUNTER — Ambulatory Visit (HOSPITAL_COMMUNITY): Payer: Medicare Other

## 2010-09-25 ENCOUNTER — Ambulatory Visit (HOSPITAL_COMMUNITY)
Admission: RE | Admit: 2010-09-25 | Discharge: 2010-09-25 | Disposition: A | Discharge status: Home / Self Care | Payer: Medicare Other | Source: Ambulatory Visit | Attending: Internal Medicine | Admitting: Internal Medicine

## 2010-09-25 DIAGNOSIS — R51 Headache: Secondary | ICD-10-CM | POA: Insufficient documentation

## 2010-09-25 DIAGNOSIS — J019 Acute sinusitis, unspecified: Secondary | ICD-10-CM

## 2010-09-25 DIAGNOSIS — G319 Degenerative disease of nervous system, unspecified: Secondary | ICD-10-CM | POA: Insufficient documentation

## 2011-01-08 LAB — CBC
HCT: 37
Platelets: 412 — ABNORMAL HIGH
RDW: 17.8 — ABNORMAL HIGH

## 2011-01-10 LAB — COMPREHENSIVE METABOLIC PANEL
ALT: 19
AST: 19
CO2: 35 — ABNORMAL HIGH
Calcium: 9.2
GFR calc Af Amer: >60
Sodium: 139
Total Protein: 6.1

## 2011-01-10 LAB — HEPATIC FUNCTION PANEL
ALT: 22
AST: 27
Alkaline Phosphatase: 54
Indirect Bilirubin: 0.3
Total Bilirubin: 0.4

## 2011-01-10 LAB — DIFFERENTIAL
Basophils Absolute: 0
Basophils Relative: 0
Eosinophils Absolute: 0.1
Eosinophils Relative: 2
Lymphs Abs: 2.7
Monocytes Absolute: 0.7
Monocytes Relative: 9
Neutro Abs: 5.5
Neutrophils Relative %: 65

## 2011-01-10 LAB — CBC
Hemoglobin: 11 — ABNORMAL LOW
MCHC: 31.7
MCHC: 32.2
RBC: 4.58
RDW: 18.5 — ABNORMAL HIGH
RDW: 18.9 — ABNORMAL HIGH

## 2011-01-10 LAB — BASIC METABOLIC PANEL
BUN: 5 — ABNORMAL LOW
CO2: 29
Calcium: 7.9 — ABNORMAL LOW
Creatinine, Ser: 0.66
Glucose, Bld: 91

## 2011-01-10 LAB — AMYLASE: Amylase: 53

## 2011-01-13 ENCOUNTER — Ambulatory Visit (HOSPITAL_COMMUNITY)
Admission: RE | Admit: 2011-01-13 | Discharge: 2011-01-13 | Disposition: A | Discharge status: Home / Self Care | Payer: Medicare Other | Source: Ambulatory Visit | Attending: Neurological Surgery | Admitting: Neurological Surgery

## 2011-01-13 DIAGNOSIS — IMO0001 Reserved for inherently not codable concepts without codable children: Secondary | ICD-10-CM | POA: Insufficient documentation

## 2011-01-13 DIAGNOSIS — R269 Unspecified abnormalities of gait and mobility: Secondary | ICD-10-CM | POA: Insufficient documentation

## 2011-01-13 DIAGNOSIS — M6281 Muscle weakness (generalized): Secondary | ICD-10-CM | POA: Insufficient documentation

## 2011-01-13 DIAGNOSIS — M545 Low back pain, unspecified: Secondary | ICD-10-CM | POA: Insufficient documentation

## 2011-01-13 DIAGNOSIS — R262 Difficulty in walking, not elsewhere classified: Secondary | ICD-10-CM | POA: Insufficient documentation

## 2011-01-13 NOTE — Patient Instructions (Addendum)
HEP

## 2011-01-13 NOTE — Progress Notes (Signed)
Physical Therapy Evaluation  Patient Details  Name: Lisa Saunders MRN: [id number] Date of Birth: Aug 25, 1932  Today's Date: 01/13/2011 Time: 1400-1501 Time Calculation (min): 61 min Visit#: 1  of 18   Re-eval: 02/12/11 Assessment Diagnosis: Low Back Pain Surgical Date: 02/25/11 Next MD Visit: after therapy Prior Therapy: 11/11  Past Medical History: No past medical history on file. Past Surgical History: No past surgical history on file.  Subjective Symptoms/Limitations Symptoms: Lisa Saunders is a known patient to this clinic.  Lisa Saunders has had three back surgeries over the years.  She was last seen in November of 2011.  She was seen for three treatments only but failed to show after this time secondary to getting the flu.  The patient states that she has now been seeing a different physcian in [facility] who is possibly going to do a new procedure on her after she had completed a round of therapy to get the mm stronger.  The  patient states that if she is standing she has immediate pain and medication does not help with  the pain therefore her activity level has decreased significantly.  She states she only gets out one day a week to get her hair done.  She states that she does not even go to church anymore.  The patient states she might make a small meal three times a week but for the most part her husband does most the cooking .  She is now being referred to physical therapy to increase her strength and  functional activity tolerance. How long can you sit comfortably?: The patient is able to sit for less than 15 minutes if it is a comfortable chair.  She is unable to sit on a couch. How long can you stand comfortably?: Pt states that she does not stand any greater than ten minutes. How long can you walk comfortably?: Less than five minutes with her walker. Pain Assessment Currently in Pain?: Yes Pain Score:   3 (Highest in the past week is a 10) Pain Location: Back Pain  Orientation: Lower;Left Pain Type: Chronic pain Pain Onset: More than a month ago Pain Frequency: Constant Pain Relieving Factors: sitting ice and heat  Precautions/Restrictions  Precautions Precautions: Fall Restrictions Weight Bearing Restrictions: No  Prior Functioning  Home Living Type of Home: House Lives With: Spouse Receives Help From: Family Home Layout: One level Home Access: Stairs to enter Entrance Stairs-Rails: Right Entrance Stairs-Number of Steps:  (4)  Sensation/Coordination/Flexibility  Pt has B hip and knee flexion while standing.  Assessment RLE Strength Right Hip Flexion: 2+/5 Right Hip Extension: 2+/5 Right Hip ABduction: 2/5 Right Hip ADduction: 3-/5 Right Knee Flexion: 4/5 Right Knee Extension: 3+/5 Right Ankle Dorsiflexion: 5/5 LLE Strength Left Hip Flexion: 3/5 Left Hip Extension: 3-/5 Left Hip ABduction: 3-/5 Left Hip ADduction: 3-/5 Left Knee Flexion: 4/5 Left Knee Extension: 4/5 Left Ankle Dorsiflexion: 5/5 Lumbar AROM Lumbar Flexion: wfl Lumbar Extension: wfl Lumbar - Right Side Bend: wfl Lumbar - Left Side Bend: decreased 20% Lumbar - Right Rotation: wfl Lumbar - Left Rotation: wnl Lumbar Strength Lumbar Flexion: 2/5 Lumbar Extension: 2/5  Exercise/Treatments For stretching and strengthening.    Physical Therapy Assessment and Plan PT Assessment and Plan Clinical Impression Statement: Pt with postural changes, gait deviation, decreased strength affecting ablity to walk and complete ADL's.   Pt will benefit from skilled therapy to address the above issures and improve the quality of the patient's life. Rehab Potential: Good Clinical Impairments Affecting Rehab  Potential: decreased strength, abnormal gait, increased pain. PT Duration: 6 weeks PT Treatment/Interventions: Gait training;Functional mobility training;Balance training;Patient/family education PT Plan: See pt for strengthening, balance and gait training three times a  week.  Begin trying heel/toe and equal stride length in next gt training, Standing hip ab,ext B; functional sqats, sit to stand, B terminal extension  and bike.    Goals Home Exercise Program Pt will Perform Home Exercise Program: Independently PT Short Term Goals Time to Complete Short Term Goals: 3 weeks PT Short Term Goal 1: Pt to be able to sit for 20 min without increased pain PT Short Term Goal 2: Pt to be able to stand for 15 minutes to make a small meal without increased pain. PT Short Term Goal 3: Pt able to walk for 15 minutes without increased pain PT Short Term Goal 4: Pain level no greater than a 6 PT Long Term Goals Time to Complete Long Term Goals:  (6 wks) PT Long Term Goal 1: I in advance HEP PT Long Term Goal 2: Pain level no greater than a 4 Long Term Goal 3: able to be up for 30 min to complete light housework Long Term Goal 4: able to walk 20-30 min without increased pain  Problem List Patient Active Problem List  Diagnoses  . Muscle weakness (generalized)  . Abnormality of gait  . Difficulty in walking    PT - End of Session Activity Tolerance: Patient tolerated treatment well General Behavior During Session: [facility] for tasks performed   RUSSELL,CINDY 01/13/2011, 3:19 PM  Physician Documentation Your signature is required to indicate approval of the treatment plan as stated above.  Please sign and either send electronically or make a copy of this report for your files and return this physician signed original.   Please mark one 1.__approve of plan  2. ___approve of plan with the following conditions.   ______________________________                                                          _____________________ Physician Signature                                                                                                             Date

## 2011-01-14 ENCOUNTER — Ambulatory Visit (HOSPITAL_COMMUNITY)
Admission: RE | Admit: 2011-01-14 | Discharge: 2011-01-14 | Disposition: A | Discharge status: Home / Self Care | Payer: Medicare Other | Source: Ambulatory Visit | Attending: *Deleted | Admitting: *Deleted

## 2011-01-14 NOTE — Progress Notes (Signed)
Physical Therapy Treatment Patient Details  Name: Lisa Saunders MRN: [id number] Date of Birth: 1932-08-23  Today's Date: 01/14/2011 Time: 2952-8413 Time Calculation (min): 49 min Visit#: 2  of 18   Re-eval: 02/12/11 Charges: 10' gt training Therex x 25'  Subjective: Symptoms/Limitations Symptoms: My back is always an 8/10. Pain Assessment Currently in Pain?: Yes Pain Score:   8 Pain Location: Back Pain Orientation: Lower   Exercise/Treatments  01/14/11 0700  Knee Exercises: Aerobic  Stationary Bike 6'@2 .0  Knee Exercises: Standing  Heel Raises 10 reps;Limitations  Heel Raises Limitations 10x toe raises  Functional Squat 10 reps  Gait Training 90' w/SPC  Other Standing Knee Exercises hip abd B x 10  Other Standing Knee Exercises hip ext B x 10  Knee Exercises: Seated  Other Seated Knee Exercises sit to stand 2x5 w/o UE A     Physical Therapy Assessment and Plan PT Assessment and Plan Clinical Impression Statement: Pt with increased difficulty with standing hip ext/abd secondary to hip weakness. Gait training completed with SPC. Pt requires VC's to to facilitate heel to toe patteran and equalization of wt distribution. PT Treatment/Interventions: Therapeutic exercise;Gait training PT Plan: Continue to progress per PT POC. Begin B terminal knee ext next tx. Also, focus on equalizing wt shift with gait next tx.     Problem List Patient Active Problem List  Diagnoses  . Muscle weakness (generalized)  . Abnormality of gait  . Difficulty in walking    PT - End of Session Activity Tolerance: Patient tolerated treatment well General Behavior During Session: [facility] for tasks performed Cognition: [facility] for tasks performed  Antonieta Iba 01/14/2011, 3:35 PM

## 2011-01-17 ENCOUNTER — Ambulatory Visit (HOSPITAL_COMMUNITY)
Admission: RE | Admit: 2011-01-17 | Discharge: 2011-01-17 | Disposition: A | Discharge status: Home / Self Care | Payer: Medicare Other | Source: Ambulatory Visit | Attending: Physical Therapy | Admitting: Physical Therapy

## 2011-01-17 DIAGNOSIS — M6281 Muscle weakness (generalized): Secondary | ICD-10-CM

## 2011-01-17 DIAGNOSIS — R262 Difficulty in walking, not elsewhere classified: Secondary | ICD-10-CM

## 2011-01-17 DIAGNOSIS — R269 Unspecified abnormalities of gait and mobility: Secondary | ICD-10-CM

## 2011-01-17 LAB — CBC
HCT: 35.4 — ABNORMAL LOW
MCV: 90.4
Platelets: 321
RDW: 13.7
WBC: 7.1

## 2011-01-17 LAB — DIFFERENTIAL
Eosinophils Relative: 4
Lymphocytes Relative: 36
Lymphs Abs: 2.5
Monocytes Absolute: 0.6
Monocytes Relative: 8
Neutro Abs: 3.7

## 2011-01-17 LAB — COMPREHENSIVE METABOLIC PANEL
AST: 25
Albumin: 3.7
BUN: 13
Creatinine, Ser: 0.74
GFR calc Af Amer: >60
Total Protein: 6.3

## 2011-01-17 LAB — URINALYSIS, ROUTINE W REFLEX MICROSCOPIC
Bilirubin Urine: NEGATIVE
Glucose, UA: NEGATIVE
Hgb urine dipstick: NEGATIVE
Nitrite: NEGATIVE
Specific Gravity, Urine: <1.005 — ABNORMAL LOW
pH: 6.5

## 2011-01-17 NOTE — Progress Notes (Signed)
Physical Therapy Treatment Patient Details  Name: Lisa Saunders MRN: [id number] Date of Birth: [birth year]  Today's Date: 01/17/2011 Time: 5621-3086 Time Calculation (min): 50 min Visit#: 3  of 18   Re-eval: 02/12/11  Charge: therex 38 min Gait 6 min  Subjective: Symptoms/Limitations Symptoms: My back is always hurting because of that screw on the nerve. Pain Assessment Currently in Pain?: Yes Pain Score:   7 Pain Location: Back Pain Orientation: Lower  Objective: pt entered amb with RW, brought SPC for gait training.  Exercise/Treatments Doctor, hospital Bike: 6'@2 .0 seat 9 Stability Bridge: 10 reps Ab Set: 5 reps;5 seconds Hip Abduction: 10 reps;Limitations Hip Abduction Limitations: standing Leg Raise: Limitations;10 reps Leg Raises Limitations: Hip extension standing with VC for posture Functional Squats: 20 reps;Limitations Functional Squats Limitations: 2 sets of 10 reps Heel Raises: 10 reps Standing Heel Raises: 10 reps;2 sets Gait Training: 200' with/SPC Seated Other Seated Knee Exercises: sit to stand 2x5 w/o UE A Other Seated Knee Exercises: glut sets sitting 10x 5"  Physical Therapy Assessment and Plan PT Assessment and Plan Clinical Impression Statement: Pt educated on equalization weight bearing/ distribution with gait with SPC.  Pt required vc for posture and equal stride length.  Added TKE for full knee extension with cueing for proper tech.  Pt tolerated  treatment well with no increase pain at end of session.   PT Plan: Continue to progress per PT POC.  Continue focusing on equalizing weight shifting with gait.    Goals    Problem List Patient Active Problem List  Diagnoses  . Muscle weakness (generalized)  . Abnormality of gait  . Difficulty in walking    PT - End of Session Activity Tolerance: Patient tolerated treatment well General Behavior During Session: [facility] for tasks performed Cognition: [facility] for tasks  performed  Juel Burrow 01/17/2011, 3:01 PM

## 2011-01-20 ENCOUNTER — Ambulatory Visit (HOSPITAL_COMMUNITY)
Admission: RE | Admit: 2011-01-20 | Discharge: 2011-01-20 | Disposition: A | Discharge status: Home / Self Care | Payer: Medicare Other | Source: Ambulatory Visit | Attending: Neurological Surgery | Admitting: Neurological Surgery

## 2011-01-20 DIAGNOSIS — M545 Low back pain, unspecified: Secondary | ICD-10-CM | POA: Insufficient documentation

## 2011-01-20 DIAGNOSIS — IMO0001 Reserved for inherently not codable concepts without codable children: Secondary | ICD-10-CM | POA: Insufficient documentation

## 2011-01-20 DIAGNOSIS — R269 Unspecified abnormalities of gait and mobility: Secondary | ICD-10-CM | POA: Insufficient documentation

## 2011-01-20 DIAGNOSIS — M6281 Muscle weakness (generalized): Secondary | ICD-10-CM | POA: Insufficient documentation

## 2011-01-20 NOTE — Progress Notes (Signed)
Physical Therapy Treatment Patient Details  Name: Lisa Saunders MRN: [id number] Date of Birth: [birth year]  Today's Date: 01/20/2011 Time: 2841-3244 Time Calculation (min): 42 min Visit#: 4  of 18   Re-eval: 02/12/11 Charges: Therex x 26' Gait x 8'  Subjective: Symptoms/Limitations Symptoms: I have pain every day but I took a pain pill today so it's not as bad as it was. Pain Assessment Currently in Pain?: Yes Pain Score:   5 Pain Location: Back Pain Orientation: Lower Pain Radiating Towards: Left leg   Exercise/Treatments  01/20/11 0700  Knee Exercises: Standing  Heel Raises 15 reps  Heel Raises Limitations 15x toe raises  Functional Squat 2 sets;10 reps  Gait Training Gt on treadmill  Other Standing Knee Exercises hip abd B x 10  Other Standing Knee Exercises hip ext B x 10     Physical Therapy Assessment and Plan PT Assessment and Plan Clinical Impression Statement: Pt ambulates into therapy without AD. Began gait training on TM with focus on increasing stride length and enhancing heel-toe pattern. Pt with improved gt after gt training on TM. Pt unable to tolerate more than 2' at a time on TM. PT Treatment/Interventions: Gait training;Therapeutic exercise PT Plan: Continue to progress per PT POC.     Problem List Patient Active Problem List  Diagnoses  . Muscle weakness (generalized)  . Abnormality of gait  . Difficulty in walking    PT - End of Session Activity Tolerance: Patient tolerated treatment well General Behavior During Session: [facility] for tasks performed Cognition: [facility] for tasks performed  Antonieta Iba 01/20/2011, 3:26 PM

## 2011-01-22 ENCOUNTER — Ambulatory Visit (HOSPITAL_COMMUNITY)
Admission: RE | Admit: 2011-01-22 | Discharge: 2011-01-22 | Disposition: A | Discharge status: Home / Self Care | Payer: Medicare Other | Source: Ambulatory Visit | Attending: Neurological Surgery | Admitting: Neurological Surgery

## 2011-01-22 NOTE — Progress Notes (Signed)
Physical Therapy Treatment Patient Details  Name: Lisa Saunders MRN: [id number] Date of Birth: 01-09-1933  Today's Date: 01/22/2011 Time: 1353-1430 Time Calculation (min): 37 min Visit#: 5  of 12   Re-eval: 02/12/11 Charges:  therex 26  , gait 8'    Subjective: Symptoms/Limitations Symptoms: Pt. reports just taking a pain pill and her back is still hurting badly.  States her asthma is acting up today. Reports compliance with her HEP. Pt states her pain is 10/10 in the morning but currently 6/10 (after pain meds).  Pt. very lethargic/shutting eyes frequently during therapy today. Pain Assessment Currently in Pain?: Yes Pain Score:   6 Pain Location: Back   Exercise/Treatments Gait Treadmill 4'@0 . ; VC's for increased stride length and posture (req. Frequent VC's to increase stride/posture) Stability Functional Squats: 20 reps;Limitations Functional Squats Limitations: 2 sets of 10 reps Heel Raises: 20 reps  Physical Therapy Assessment and Plan PT Assessment and Plan Clinical Impression Statement: Pt. ability limited today by fatigue and possibly medication.  Pt. required constant rest breaks and water during treatment.  Pt unable to come to standing position without use of UE's today.  Pt. Able to tolerate full 4 minutes on treadmill today (was only 2' max last visit). PT Treatment/Interventions: Therapeutic exercise PT Plan: Continue to increase strength for back surgery preparation.    Problem List Patient Active Problem List  Diagnoses  . Muscle weakness (generalized)  . Abnormality of gait  . Difficulty in walking    PT - End of Session Activity Tolerance: Patient limited by fatigue (lethargy and pain) General Behavior During Session: Lethargic Cognition: [facility] for tasks performed  Lurena Nida 01/22/2011, 2:38 PM

## 2011-01-24 ENCOUNTER — Ambulatory Visit (HOSPITAL_COMMUNITY)
Admission: RE | Admit: 2011-01-24 | Discharge: 2011-01-24 | Disposition: A | Discharge status: Home / Self Care | Payer: Medicare Other | Source: Ambulatory Visit | Attending: Neurological Surgery | Admitting: Neurological Surgery

## 2011-01-24 DIAGNOSIS — R262 Difficulty in walking, not elsewhere classified: Secondary | ICD-10-CM

## 2011-01-24 DIAGNOSIS — R269 Unspecified abnormalities of gait and mobility: Secondary | ICD-10-CM

## 2011-01-24 DIAGNOSIS — M6281 Muscle weakness (generalized): Secondary | ICD-10-CM

## 2011-01-24 LAB — CBC
HCT: 42.3 % (ref 36.0–46.0)
HCT: 38.8
Hemoglobin: 12.1 g/dL (ref 12.0–15.0)
Hemoglobin: 13.8 g/dL (ref 12.0–15.0)
MCHC: 33 g/dL (ref 30.0–36.0)
MCV: 88.9 fL (ref 78.0–100.0)
MCV: 84.1
Platelets: 303 10*3/uL (ref 150–400)
Platelets: 533 — ABNORMAL HIGH
RBC: 4.61
RDW: 13.3 % (ref 11.5–15.5)
RDW: 13.3 % (ref 11.5–15.5)
WBC: 8.2

## 2011-01-24 LAB — DIFFERENTIAL
Basophils Absolute: 0.1 10*3/uL (ref 0.0–0.1)
Basophils Relative: 1 % (ref 0–1)
Lymphocytes Relative: 10 % — ABNORMAL LOW (ref 12–46)
Lymphocytes Relative: 22 % (ref 12–46)
Lymphs Abs: 1.3 10*3/uL (ref 0.7–4.0)
Monocytes Absolute: 0.6 10*3/uL (ref 0.1–1.0)
Monocytes Relative: 2 % — ABNORMAL LOW (ref 3–12)
Neutro Abs: 10.9 10*3/uL — ABNORMAL HIGH (ref 1.7–7.7)
Neutro Abs: 8 10*3/uL — ABNORMAL HIGH (ref 1.7–7.7)
Neutrophils Relative %: 71 % (ref 43–77)
Neutrophils Relative %: 87 % — ABNORMAL HIGH (ref 43–77)

## 2011-01-24 LAB — BASIC METABOLIC PANEL
CO2: 26 mEq/L (ref 19–32)
Calcium: 8.4 mg/dL (ref 8.4–10.5)
Chloride: 94 mEq/L — ABNORMAL LOW (ref 96–112)
Creatinine, Ser: 0.67 mg/dL (ref 0.4–1.2)
GFR calc non Af Amer: >60 mL/min (ref >60)
GFR calc non Af Amer: >60 mL/min (ref >60)
Glucose, Bld: 110 mg/dL — ABNORMAL HIGH (ref 70–99)
Glucose, Bld: 116 mg/dL — ABNORMAL HIGH (ref 70–99)
Potassium: 3.9 mEq/L (ref 3.5–5.1)
Sodium: 129 mEq/L — ABNORMAL LOW (ref 135–145)
Sodium: 138 mEq/L (ref 135–145)

## 2011-01-24 LAB — URINALYSIS, ROUTINE W REFLEX MICROSCOPIC
Glucose, UA: NEGATIVE mg/dL
Nitrite: NEGATIVE
pH: 5.5 (ref 5.0–8.0)

## 2011-01-24 LAB — CULTURE, BLOOD (ROUTINE X 2)
Culture: NO GROWTH
Culture: NO GROWTH

## 2011-01-24 LAB — POCT CARDIAC MARKERS
CKMB, poc: 2.3 ng/mL (ref 1.0–8.0)
Myoglobin, poc: 223 ng/mL (ref 12–200)
Troponin i, poc: <0.05 ng/mL (ref 0.00–0.09)

## 2011-01-24 LAB — HEPATIC FUNCTION PANEL
ALT: 20 U/L (ref 0–35)
AST: 24 U/L (ref 0–37)
Albumin: 3.1 g/dL — ABNORMAL LOW (ref 3.5–5.2)
Total Protein: 5.7 g/dL — ABNORMAL LOW (ref 6.0–8.3)

## 2011-01-24 LAB — AMMONIA: Ammonia: <8 umol/L — ABNORMAL LOW (ref 11–35)

## 2011-01-24 LAB — RAPID URINE DRUG SCREEN, HOSP PERFORMED
Amphetamines: NOT DETECTED
Barbiturates: NOT DETECTED
Benzodiazepines: POSITIVE — AB
Opiates: NOT DETECTED

## 2011-01-24 LAB — URINE MICROSCOPIC-ADD ON

## 2011-01-24 LAB — CLOSTRIDIUM DIFFICILE EIA

## 2011-01-24 LAB — URINE CULTURE

## 2011-01-24 NOTE — Progress Notes (Signed)
Physical Therapy Treatment Patient Details  Name: Lisa Saunders MRN: [id number] Date of Birth: 11-Feb-1933  Today's Date: 01/24/2011 Time: 4098-1191 Time Calculation (min): 43 min Charges: 54' TE Visit#: 6  of 12   Re-eval: 02/12/11    Subjective: Symptoms/Limitations Symptoms: I am not feeling great today, but it is my knee that really hurts. Pt reports adherence to her HEP.  She reoprts that some of her exercises are getting easier.  Pt reports she has 4 stairs to get into her home with 1 handrail.  Pain Assessment Currently in Pain?: Yes Pain Score:   8 Pain Location: Knee Pain Orientation: Left   Exercise/Treatments Machine Exercises Tread Mill: 4:30 0.4-0.6 mph w/cueing for posture and technique  Standing Heel Raises: 20 reps Heel Raises Limitations: 20X toeraises Functional Squat: 2 sets;10 reps Stairs: 2 RT 4 stairs with lateral approach to prepare for home return after surgery. Seated Long Arc Quad: 2 sets;15 reps Long Arc Quad Weight: 3 lbs. Other Seated Knee Exercises: hamstring Curls 15x BLE w/grn Tband     Physical Therapy Assessment and Plan PT Assessment and Plan Clinical Impression Statement: Pt continues to be limited in muscular endurance. And requires multiple rest breaks throughout the session to continue.  Discusses stragies for home return after surgery and educated pt to work on lateral stair approach when home to prepare herself for home return.  PT Plan: Continue to increase strength for back surgery preparation    Goals Home Exercise Program Pt will Perform Home Exercise Program: Independently PT Goal: Perform Home Exercise Program - Progress: Met PT Short Term Goals Time to Complete Short Term Goals: 3 weeks PT Short Term Goal 1: Pt to be able to sit for 20 min without increased pain PT Short Term Goal 1 - Progress: Met PT Short Term Goal 2: Pt to be able to stand for 15 minutes to make a small meal without increased pain. PT Short Term  Goal 2 - Progress: Not met PT Short Term Goal 3: Pt able to walk for 15 minutes without increased pain PT Short Term Goal 3 - Progress: Not met PT Short Term Goal 4: Pain level no greater than a 6 PT Short Term Goal 4 - Progress: Not met PT Long Term Goals Time to Complete Long Term Goals:  (6 wks) PT Long Term Goal 1: I in advance HEP PT Long Term Goal 2: Pain level no greater than a 4 Long Term Goal 3: able to be up for 30 min to complete light housework Long Term Goal 4: able to walk 20-30 min without increased pain  Problem List Patient Active Problem List  Diagnoses  . Muscle weakness (generalized)  . Abnormality of gait  . Difficulty in walking       Earnesteen Birnie 01/24/2011, 2:37 PM

## 2011-01-27 LAB — CBC
HCT: 36.6
Hemoglobin: 11.8 — ABNORMAL LOW
MCV: 86
Platelets: 396
RDW: 19 — ABNORMAL HIGH

## 2011-01-28 ENCOUNTER — Ambulatory Visit (HOSPITAL_COMMUNITY)
Admission: RE | Admit: 2011-01-28 | Discharge: 2011-01-28 | Disposition: A | Discharge status: Home / Self Care | Payer: Medicare Other | Source: Ambulatory Visit | Attending: Neurological Surgery | Admitting: Neurological Surgery

## 2011-01-28 DIAGNOSIS — R269 Unspecified abnormalities of gait and mobility: Secondary | ICD-10-CM

## 2011-01-28 DIAGNOSIS — M6281 Muscle weakness (generalized): Secondary | ICD-10-CM

## 2011-01-28 DIAGNOSIS — R262 Difficulty in walking, not elsewhere classified: Secondary | ICD-10-CM

## 2011-01-28 LAB — CBC
HCT: 34.7 — ABNORMAL LOW
Hemoglobin: 11.3 — ABNORMAL LOW
MCHC: 32.4
Platelets: 369
RDW: 18.4 — ABNORMAL HIGH

## 2011-01-28 LAB — RETICULOCYTES
RBC.: 4.1
Retic Ct Pct: 1.7

## 2011-01-28 LAB — IRON AND TIBC
Saturation Ratios: 19 — ABNORMAL LOW
TIBC: 329

## 2011-01-28 NOTE — Progress Notes (Addendum)
Physical Therapy Treatment Patient Details  Name: Lisa Saunders MRN: [id number] Date of Birth: [birth year]  Today's Date: 01/28/2011 Time: 7829-5621 Time Calculation (min): 45 min Charges: 82' T Visit#: 7  of 12   Re-eval: 02/12/11    Subjective: Symptoms/Limitations Symptoms: Pt reports she does not do well in the morning and reports pain to both of her legs.  She reports that her L knee is swollen and warm more than normal Pain Assessment Currently in Pain?: Yes  Exercise/Treatments Stretches Hip Flexor Stretch: 3 reps;30 seconds;Limitations (BLE) Hip Flexor Stretch Limitations: BLE Stability Clam: Side-lying;10 reps;Limitations Clam Limitations: BLE Bridge: 10 reps;5 seconds Hip Abduction: 10 reps;Limitations;Side-lying Hip Abduction Limitations: BLE  01/28/11 0700  Knee Exercises: Aerobic  Stationary Bike 6'@2 .0 seat 9 (To improve muscular endurance)  Knee Exercises: Standing  Other Standing Knee Exercises Balance: 1 foot on 4 inch stair 3x30 sec BLE   Stairs in gym w/lateral approach and 1 handrail x2 Standing: Hip extension and hip abduction x10 each BLE -cueing for posture Functional Squats 20x - cueing for posture     Physical Therapy Assessment and Plan PT Assessment and Plan Clinical Impression Statement: Pt had improved endurance today, however is greatly limited by R lateral hip weakness which inhibits her gait, balance and functional activities.  PT Plan: Cont to progress R lateral hip weakness, improve balance and core strength.    Goals    Problem List Patient Active Problem List  Diagnoses  . Muscle weakness (generalized)  . Abnormality of gait  . Difficulty in walking      Lisa Saunders 01/28/2011, 11:48 AM

## 2011-01-29 LAB — RETICULOCYTES
RBC.: 4.03
Retic Ct Pct: 1.2

## 2011-01-29 LAB — CBC
Hemoglobin: 10.5 — ABNORMAL LOW
RBC: 4.03
WBC: 8.9

## 2011-01-30 ENCOUNTER — Ambulatory Visit (HOSPITAL_COMMUNITY): Payer: Medicare Other

## 2011-01-30 LAB — COMPREHENSIVE METABOLIC PANEL
ALT: 16
AST: 20
Albumin: 3.6
CO2: 32
Calcium: 9.2
Creatinine, Ser: 1.09
GFR calc Af Amer: 60 — ABNORMAL LOW
Sodium: 137
Total Protein: 7

## 2011-01-30 LAB — DIFFERENTIAL
Eosinophils Absolute: 0.2
Eosinophils Relative: 2
Lymphocytes Relative: 33
Lymphs Abs: 2.5
Monocytes Absolute: 0.7
Monocytes Relative: 10

## 2011-01-30 LAB — IRON AND TIBC
Iron: 46
UIBC: 337

## 2011-01-30 LAB — RETICULOCYTES
Retic Count, Absolute: 52.9
Retic Ct Pct: 1.4

## 2011-01-30 LAB — FERRITIN: Ferritin: 18 (ref 10–291)

## 2011-01-30 LAB — CBC
Platelets: 413 — ABNORMAL HIGH
RBC: 3.78 — ABNORMAL LOW
WBC: 7.4

## 2011-01-30 LAB — ANA: Anti Nuclear Antibody(ANA): NEGATIVE

## 2011-01-30 LAB — RHEUMATOID FACTOR: Rhuematoid fact SerPl-aCnc: <20

## 2011-01-31 ENCOUNTER — Ambulatory Visit (HOSPITAL_COMMUNITY)
Admission: RE | Admit: 2011-01-31 | Discharge: 2011-01-31 | Disposition: A | Discharge status: Home / Self Care | Payer: Medicare Other | Source: Ambulatory Visit | Attending: Neurological Surgery | Admitting: Neurological Surgery

## 2011-01-31 NOTE — Progress Notes (Signed)
Physical Therapy Treatment Patient Details  Name: Lisa Saunders MRN: [id number] Date of Birth: 09-17-1932  Today's Date: 01/31/2011 Time: 7829-5621 Time Calculation (min): 58 min Visit#: 8  of 12   Re-eval: 02/12/11 Charges: Therex x 45   Subjective: Symptoms/Limitations Symptoms: Pt reports she is having pain in her left knee. Pain Assessment Pain Score:   6 Pain Location: Knee Pain Orientation: Left   Exercise/Treatments Stability Clam: Side-lying;10 reps;Limitations Clam Limitations: BLE Bridge: 15 reps Hip Abduction: 10 reps;Limitations;Side-lying Hip Abduction Limitations: BLE  01/31/11 0700  Knee Exercises: Stretches  Hip Flexor Stretch 3 reps;30 seconds  Knee Exercises: Aerobic  Stationary Bike 6'@2 .0 seat 9  Knee Exercises: Standing  Heel Raises 20 reps  Heel Raises Limitations 20X toeraises  Functional Squat 2 sets;10 reps      Physical Therapy Assessment and Plan PT Assessment and Plan Clinical Impression Statement: Pt easily fatigued with therex this tx. Pt display tight hip flexors and B hip weakness. Pt requires multimodal cueing to correctly complete mat exercises. PT Treatment/Interventions: Therapeutic exercise PT Plan: Continue to progress strength, balance, and activity tolerance.     Problem List Patient Active Problem List  Diagnoses  . Muscle weakness (generalized)  . Abnormality of gait  . Difficulty in walking    PT - End of Session Activity Tolerance: Patient limited by fatigue (lethargy and pain) General Behavior During Session: [facility] for tasks performed Cognition: [facility] for tasks performed  Antonieta Iba 01/31/2011, 4:56 PM

## 2011-02-03 ENCOUNTER — Ambulatory Visit (HOSPITAL_COMMUNITY)
Admission: RE | Admit: 2011-02-03 | Discharge: 2011-02-03 | Disposition: A | Discharge status: Home / Self Care | Payer: Medicare Other | Source: Ambulatory Visit | Attending: Neurological Surgery | Admitting: Neurological Surgery

## 2011-02-03 DIAGNOSIS — M6281 Muscle weakness (generalized): Secondary | ICD-10-CM

## 2011-02-03 DIAGNOSIS — R262 Difficulty in walking, not elsewhere classified: Secondary | ICD-10-CM

## 2011-02-03 DIAGNOSIS — R269 Unspecified abnormalities of gait and mobility: Secondary | ICD-10-CM

## 2011-02-03 LAB — POCT CARDIAC MARKERS
CKMB, poc: 1.1
Myoglobin, poc: 95.1
Operator id: 211291
Troponin i, poc: <0.05

## 2011-02-03 LAB — COMPREHENSIVE METABOLIC PANEL
AST: 22
Albumin: 3.1 — ABNORMAL LOW
Alkaline Phosphatase: 56
CO2: 29
Chloride: 101
GFR calc Af Amer: >60
GFR calc non Af Amer: >60
Potassium: 3.9
Total Bilirubin: 0.4

## 2011-02-03 LAB — DIFFERENTIAL
Basophils Absolute: 0
Basophils Relative: 0
Eosinophils Absolute: 0.2
Eosinophils Relative: 2
Lymphocytes Relative: 16
Lymphs Abs: 2.1
Monocytes Absolute: 0.8 — ABNORMAL HIGH
Monocytes Relative: 6
Neutro Abs: 9.9 — ABNORMAL HIGH
Neutrophils Relative %: 76

## 2011-02-03 LAB — URINALYSIS, ROUTINE W REFLEX MICROSCOPIC
Bilirubin Urine: NEGATIVE
Glucose, UA: NEGATIVE
Hgb urine dipstick: NEGATIVE
Protein, ur: NEGATIVE
Urobilinogen, UA: 0.2

## 2011-02-03 LAB — LIPASE, BLOOD: Lipase: 13

## 2011-02-03 LAB — CBC
HCT: 27.1 — ABNORMAL LOW
Platelets: 368
RBC: 3.3 — ABNORMAL LOW
WBC: 13 — ABNORMAL HIGH

## 2011-02-03 NOTE — Progress Notes (Signed)
Physical Therapy Treatment Patient Details  Name: INGEBORG FITE MRN: [id number] Date of Birth: Jul 03, 1932  Today's Date: 02/03/2011 Time: 2130-8657 Time Calculation (min): 42 min Visit#: 9  of 12   Re-eval: 02/12/11  Charge:  There ex x 36, HMP x 15 min  Subjective: Symptoms/Limitations Symptoms: Patient states that most of her problem is in her left knee not her back. Pain Assessment Currently in Pain?: Yes Pain Score:   5 Pain Location: Back Pain Orientation: Lower Pain Type: Chronic pain Pain Onset: More than a month ago Pain Frequency: Constant Multiple Pain Sites: Yes     Exercise/Treatments Stretches Active Hamstring Stretch: 3 reps;30 seconds Single Knee to Chest Stretch: 3 reps;30 seconds Lumbar Exercises   Stability Sidelying: Abduction Bridge: 10 reps Bent Knee Raise: 10 reps Hip Abduction: 10 reps Hip Abduction Limitations: adductor squeeze Functional Squats:  (held) Heel Raises:  (held) Doctor, hospital Bike: 6' @ 2.5  Modalities Modalities: Moist Heat Moist Heat Therapy Number Minutes Moist Heat: 15 Minutes Moist Heat Location:  (Low back)  Physical Therapy Assessment and Plan PT Assessment and Plan Clinical Impression Statement: Patient with very weak hip abductors.  Held weight-bearing exerciese secondary to knee pain PT Plan: Continue to see pt    Goals    Problem List Patient Active Problem List  Diagnoses  . Muscle weakness (generalized)  . Abnormality of gait  . Difficulty in walking    PT - End of Session Activity Tolerance: Patient tolerated treatment well General Behavior During Session: [facility] for tasks performed Cognition: [facility] for tasks performed  RUSSELL,CINDY 02/03/2011, 3:18 PM

## 2011-02-05 ENCOUNTER — Ambulatory Visit (HOSPITAL_COMMUNITY)
Admission: RE | Admit: 2011-02-05 | Discharge: 2011-02-05 | Disposition: A | Discharge status: Home / Self Care | Payer: Medicare Other | Source: Ambulatory Visit | Attending: Neurological Surgery | Admitting: Neurological Surgery

## 2011-02-05 DIAGNOSIS — R262 Difficulty in walking, not elsewhere classified: Secondary | ICD-10-CM

## 2011-02-05 DIAGNOSIS — R269 Unspecified abnormalities of gait and mobility: Secondary | ICD-10-CM

## 2011-02-05 DIAGNOSIS — M6281 Muscle weakness (generalized): Secondary | ICD-10-CM

## 2011-02-05 NOTE — Progress Notes (Signed)
Physical Therapy Treatment Patient Details  Name: ISADORE BOKHARI MRN: [id number] Date of Birth: [birth year]  Today's Date: 02/05/2011 Time: 7829-5621 Time Calculation (min): 34 min\ Charges: TE: 34', 1 Hot Pack Visit#: 10  of 12   Re-eval: 02/12/11    Subjective: Symptoms/Limitations Symptoms: I have a Dr. Sherwood Gambler apt at 330 today. Pt reports that the heat really helped last visit and she said she felt better then she has in a long time and was able to go home and sleep better.  Objective: Pt takes awhile to get back and needs to use restroom before therapy can begin.  Pain Assessment Currently in Pain?: Yes Pain Score:   5 Pain Location: Back Pain Type: Chronic pain  Precautions/Restrictions     Mobility (including Balance)       Exercise/Treatments All exercises with HOT PACK in supine Stretches Active Hamstring Stretch: 3 reps;30 seconds;Limitations Active Hamstring Stretch Limitations: BLE Single Knee to Chest Stretch: 3 reps;30 seconds;Limitations Single Knee to Chest Stretch Limitations: BLE Stability Clam: Supine;10 reps;Limitations Clam Limitations: BLE Dead Bug: Supine;5 reps;Limitations Dead Bug Limitations: RUE/LLE x5 in a row w/heel touch down, LUE/RLE w/heel touch down Bridge: 20 reps Bent Knee Raise: 10 reps;Limitations Bent Knee Raise Limitations: BLE Hip Abduction: Limitations Hip Abduction Limitations: adductor squeeze 10x5 sec hold  Modalities Modalities: Moist Heat Moist Heat Therapy Number Minutes Moist Heat: 30 Minutes Moist Heat Location: Other (comment) (Low back, during ther-ex)  Physical Therapy Assessment and Plan PT Assessment and Plan Clinical Impression Statement: Pt tolerated all exercise well while on the hot pack today.  Held weight bearing exercise secondary to increased pain to her knee and low back and wanted to continue with supine exercise with heat to continue with pain relief.  Normal redness to her back after treatment  with heat today.  PT Plan: Re-eval in 3 visits    Goals    Problem List Patient Active Problem List  Diagnoses  . Muscle weakness (generalized)  . Abnormality of gait  . Difficulty in walking       Nickolus Wadding 02/05/2011, 3:16 PM

## 2011-02-07 ENCOUNTER — Ambulatory Visit (HOSPITAL_COMMUNITY): Payer: Medicare Other | Admitting: Physical Therapy

## 2011-02-10 ENCOUNTER — Ambulatory Visit (HOSPITAL_COMMUNITY)
Admission: RE | Admit: 2011-02-10 | Discharge: 2011-02-10 | Disposition: A | Discharge status: Home / Self Care | Payer: Medicare Other | Source: Ambulatory Visit | Attending: Neurological Surgery | Admitting: Neurological Surgery

## 2011-02-10 DIAGNOSIS — R269 Unspecified abnormalities of gait and mobility: Secondary | ICD-10-CM

## 2011-02-10 DIAGNOSIS — M6281 Muscle weakness (generalized): Secondary | ICD-10-CM

## 2011-02-10 DIAGNOSIS — R262 Difficulty in walking, not elsewhere classified: Secondary | ICD-10-CM

## 2011-02-10 NOTE — Patient Instructions (Signed)
hep

## 2011-02-10 NOTE — Progress Notes (Signed)
Physical Therapy Evaluation  Patient Details  Name: Lisa Saunders MRN: [id number] Date of Birth: 1932/08/09  Today's Date: 02/10/2011 Time: 7829-5621 Time Calculation (min): 39 min Visit#: 11  of 12   Re-eval:      Past Medical History: No past medical history on file. Past Surgical History: No past surgical history on file.  Subjective Symptoms/Limitations Symptoms: Pt states that her back is better.  She is doing her exercises at home. How long can you sit comfortably?: The patient states that she does not sit much but is able to sit for a half hour.  (was 15 minutes at the evaluation) How long can you stand comfortably?: The patient states that she can stand to make a meal.  She is unsure how long  this takes. Was unable to stand greater than ten minutes. How long can you walk comfortably?: Patient is currently walking with her can again she was wlking with her walker. Pain Assessment Currently in Pain?: Yes Pain Score:   5 Pain Location: Back Pain Type: Chronic pain Pain Radiating Towards: Radiates towards her leg if she has been up to long. Pain Onset: More than a month ago Pain Frequency: Intermittent (pain was constant now it is intermittent.)       Assessment RLE Strength Right Hip Flexion: 3+/5 was 2+ Right Hip Extension: 3-/5 was 2+ Right Hip ABduction: 2+/5 was 2 Right Hip ADduction: 3/5 was 3- Right Knee Flexion: 5/5 was 4 Right Knee Extension: 5/5 was 4 Right Ankle Dorsiflexion: 5/5 was 5 LLE Strength Left Hip Flexion: 5/5 was 4 Left Hip Extension: 3/5 was 3- Left Hip ABduction: 3/5 was 3- Left Hip ADduction: 3/5 was 3- Left Knee Flexion: 5/5 was 4 Left Knee Extension: 4/5 was 4 Left Ankle Dorsiflexion: 5/5 was 5 Lumbar Strength Lumbar Flexion: 2+/5 was 2 Lumbar Extension: 2/5 was 2 Exercise/Treatments Stretches Active Hamstring Stretch: 3 reps;30 seconds;Limitations Active Hamstring Stretch Limitations: BLE Single Knee to Chest Stretch: 3  reps;30 seconds;Limitations Single Knee to Chest Stretch Limitations: BLE    Stability Bridge: 20 reps Hip Abduction: Limitations Hip Abduction Limitations: adductor squeeze 10x5 sec hold Functional Squats: 10 reps Heel Raises: 10 reps     Physical Therapy Assessment and Plan PT Assessment and Plan Clinical Impression Statement: Pt re-evaled today.  PT has made gains in all areas.  Recommend pt to be discharged to home exercise program. PT Plan: discharge to HEP    Goals Home Exercise Program Pt will Perform Home Exercise Program: Independently PT Short Term Goals PT Short Term Goal 1 - Progress: Met PT Short Term Goal 2 - Progress: Met PT Short Term Goal 3 - Progress: Met PT Short Term Goal 4 - Progress: Met PT Long Term Goals PT Long Term Goal 1 - Progress: Met PT Long Term Goal 2 - Progress: Not met Long Term Goal 3 Progress: Partly met (Pt able to do the dusting but unable to stay up for 30 min.) Long Term Goal 4 Progress: Not met  Problem List Patient Active Problem List  Diagnoses  . Muscle weakness (generalized)  . Abnormality of gait  . Difficulty in walking    PT - End of Session Activity Tolerance: Patient tolerated treatment well General Behavior During Session: [facility] for tasks performed Cognition: [facility] for tasks performed   Nyko Gell,CINDY 02/10/2011, 3:17 PM  Physician Documentation Your signature is required to indicate approval of the treatment plan as stated above.  Please sign and either send electronically or make a copy  of this report for your files and return this physician signed original.   Please mark one 1.__approve of plan  2. ___approve of plan with the following conditions.   ______________________________                                                          _____________________ Physician Signature                                                                                                             Date

## 2011-02-12 ENCOUNTER — Ambulatory Visit (HOSPITAL_COMMUNITY): Payer: Medicare Other | Admitting: Physical Therapy

## 2011-02-14 ENCOUNTER — Ambulatory Visit (HOSPITAL_COMMUNITY): Payer: Medicare Other | Admitting: Physical Therapy

## 2011-04-18 ENCOUNTER — Other Ambulatory Visit: Payer: Self-pay | Admitting: Obstetrics and Gynecology

## 2011-04-18 ENCOUNTER — Other Ambulatory Visit (HOSPITAL_COMMUNITY): Payer: Self-pay | Admitting: Internal Medicine

## 2011-04-18 DIAGNOSIS — Z0000 Encounter for general adult medical examination without abnormal findings: Secondary | ICD-10-CM

## 2011-04-21 ENCOUNTER — Ambulatory Visit (HOSPITAL_COMMUNITY)
Admission: RE | Admit: 2011-04-21 | Discharge: 2011-04-21 | Disposition: A | Discharge status: Home / Self Care | Payer: Medicare Other | Source: Ambulatory Visit | Attending: Obstetrics and Gynecology | Admitting: Obstetrics and Gynecology

## 2011-04-21 DIAGNOSIS — Z0000 Encounter for general adult medical examination without abnormal findings: Secondary | ICD-10-CM

## 2011-04-21 DIAGNOSIS — Z1231 Encounter for screening mammogram for malignant neoplasm of breast: Secondary | ICD-10-CM | POA: Insufficient documentation

## 2011-06-17 ENCOUNTER — Encounter (INDEPENDENT_AMBULATORY_CARE_PROVIDER_SITE_OTHER): Payer: Self-pay | Admitting: *Deleted

## 2011-07-08 ENCOUNTER — Ambulatory Visit (INDEPENDENT_AMBULATORY_CARE_PROVIDER_SITE_OTHER): Payer: Medicare Other | Admitting: Internal Medicine

## 2011-07-08 ENCOUNTER — Encounter (INDEPENDENT_AMBULATORY_CARE_PROVIDER_SITE_OTHER): Payer: Self-pay | Admitting: Internal Medicine

## 2011-07-08 VITALS — BP 132/70 | HR 66 | Temp 97.8°F | Ht 59.0 in | Wt 201.9 lb

## 2011-07-08 DIAGNOSIS — R109 Unspecified abdominal pain: Secondary | ICD-10-CM

## 2011-07-08 DIAGNOSIS — I10 Essential (primary) hypertension: Secondary | ICD-10-CM

## 2011-07-08 DIAGNOSIS — K6289 Other specified diseases of anus and rectum: Secondary | ICD-10-CM

## 2011-07-08 DIAGNOSIS — R195 Other fecal abnormalities: Secondary | ICD-10-CM

## 2011-07-08 DIAGNOSIS — M549 Dorsalgia, unspecified: Secondary | ICD-10-CM | POA: Insufficient documentation

## 2011-07-08 LAB — COMPREHENSIVE METABOLIC PANEL
Alkaline Phosphatase: 58 U/L (ref 39–117)
Glucose, Bld: 87 mg/dL (ref 70–99)
Sodium: 136 mEq/L (ref 135–145)
Total Bilirubin: 0.3 mg/dL (ref 0.3–1.2)
Total Protein: 6.8 g/dL (ref 6.0–8.3)

## 2011-07-08 LAB — CBC WITH DIFFERENTIAL/PLATELET
Basophils Absolute: 0 10*3/uL (ref 0.0–0.1)
Basophils Relative: 0 % (ref 0–1)
Eosinophils Absolute: 0.2 10*3/uL (ref 0.0–0.7)
MCH: 29.7 pg (ref 26.0–34.0)
MCHC: 31.3 g/dL (ref 30.0–36.0)
Monocytes Relative: 10 % (ref 3–12)
Neutrophils Relative %: 59 % (ref 43–77)
Platelets: 326 10*3/uL (ref 150–400)
RDW: 13.4 % (ref 11.5–15.5)

## 2011-07-08 NOTE — Progress Notes (Signed)
Subjective:     Patient ID: Lisa Saunders, female   DOB: 10/12/1932, 77 y.o.   MRN: [id number] Lisa Saunders is a 78 yr old female referred to our office by Dr. Sherwood Gambler.   She c/o lower abdominal growling. She wakes up at night and with a gnawing feeling in her lower abdomen. Symptoms started when she started the Vitamin B12 x 3 months. Her appetite is good. No weight loss. No acid reflux. She usually has a BM once a day. No melena or bright red rectal bleeding. She does have some loose stools. Sometimes she is incontinent due to her difficulty walking.  Her last colonoscopy attempt was 4 yr ago. She underwent a Barium enema and was normal.  She c/o rectal burning and itching with BM. She tells me she is going to have some type of surgery on her back for pain.       HPI  Review of Systems see hpi Current Outpatient Prescriptions  Medication Sig Dispense Refill  . albuterol (PROVENTIL HFA;VENTOLIN HFA) 108 (90 BASE) MCG/ACT inhaler Inhale 2 puffs into the lungs every 6 (six) hours as needed.      . calcitonin, salmon, (MIACALCIN/FORTICAL) 200 UNIT/ACT nasal spray Place 1 spray into the nose daily.      . Calcium Carbonate-Vitamin D (CALTRATE 600+D PO) Take by mouth.      . cholecalciferol (VITAMIN D) 400 UNITS TABS Take 1,000 Units by mouth.      . ferrous sulfate 325 (65 FE) MG tablet Take 325 mg by mouth daily with breakfast.      . FLUoxetine (PROZAC) 20 MG tablet Take 20 mg by mouth daily.      . metoprolol succinate (TOPROL-XL) 50 MG 24 hr tablet Take 100 mg by mouth daily. Take with or immediately following a meal.      . oxyCODONE-acetaminophen (PERCOCET) 10-325 MG per tablet Take 1 tablet by mouth every 4 (four) hours as needed.      . potassium chloride (KLOR-CON) 20 MEQ packet Take 20 mEq by mouth 2 (two) times daily.      . vitamin B-12 (CYANOCOBALAMIN) 1000 MCG tablet Take 1,000 mcg by mouth daily.       Past Medical History  Diagnosis Date  . Back pain   . Hypertension    Past  Surgical History  Procedure Date  . Back surgery x 3    . Abdominal hysterectomy   . Appendectomy   . Cholecystectomy   . Knee surgery    History   Social History  . Marital Status: Married    Spouse Name: N/A    Number of Children: N/A  . Years of Education: N/A   Occupational History  . Not on file.   Social History Main Topics  . Smoking status: Never Smoker   . Smokeless tobacco: Not on file  . Alcohol Use: No  . Drug Use: No  . Sexually Active: Not on file   Other Topics Concern  . Not on file   Social History Narrative  . No narrative on file   Family Status  Relation Status Death Age  . Mother Alive     natural  . Father Deceased     [place]  . Sister Alive     overweight, high cholesterol   Allergies  Allergen Reactions  . Morphine        Objective:   Physical Exam Filed Vitals:   07/08/11 1015  Height: 4\' 11"  (1.499 m)  Weight:  201 lb 14.4 oz (91.581 kg)    Alert and oriented. Skin warm and dry. Oral mucosa is moist.   . Sclera anicteric, conjunctivae is pink. Thyroid not enlarged. No cervical lymphadenopathy. Lungs clear. Heart regular rate and rhythm.  Abdomen is soft. Bowel sounds are positive. No hepatomegaly. No abdominal masses felt. No tenderness.  No edema to lower extremities. Patient is alert and oriented.  Stool brown and guaiac positive    Assessment:   Vague abdominal pain. Rectal itching.  Guaiac positive stool. I discussed this case with Dr Karilyn Cota    Plan:  US abdomen/pelvis, CBC, Cmet, Further recommendations to follow. May need a CT abdomen/pelvis/ colonoscopy.

## 2011-07-08 NOTE — Patient Instructions (Signed)
CBC, CMET, US abdomen 

## 2011-07-11 ENCOUNTER — Telehealth (INDEPENDENT_AMBULATORY_CARE_PROVIDER_SITE_OTHER): Payer: Self-pay | Admitting: Internal Medicine

## 2011-07-11 NOTE — Telephone Encounter (Signed)
Labs faxed to Dr Earlene Plater

## 2011-07-11 NOTE — Telephone Encounter (Signed)
I have spoke with husband. Will send her labs over to Dr. Minerva Fester, her urologist.   Lisa Saunders, please fax Ms Schoenfeldt's CBC and Cmet to Dr. Minerva Fester.  I think he is a Research scientist (medical) at [employer].

## 2011-07-11 NOTE — Telephone Encounter (Signed)
Ann handles the release of records.

## 2011-07-14 ENCOUNTER — Ambulatory Visit (HOSPITAL_COMMUNITY)
Admission: RE | Admit: 2011-07-14 | Discharge: 2011-07-14 | Disposition: A | Discharge status: Home / Self Care | Payer: Medicare Other | Source: Ambulatory Visit | Attending: Internal Medicine | Admitting: Internal Medicine

## 2011-07-14 DIAGNOSIS — K921 Melena: Secondary | ICD-10-CM | POA: Insufficient documentation

## 2011-07-14 DIAGNOSIS — Z9089 Acquired absence of other organs: Secondary | ICD-10-CM | POA: Insufficient documentation

## 2011-07-14 DIAGNOSIS — R109 Unspecified abdominal pain: Secondary | ICD-10-CM | POA: Insufficient documentation

## 2011-07-14 DIAGNOSIS — R195 Other fecal abnormalities: Secondary | ICD-10-CM

## 2011-07-19 ENCOUNTER — Encounter (HOSPITAL_COMMUNITY): Payer: Self-pay

## 2011-07-19 ENCOUNTER — Emergency Department (HOSPITAL_COMMUNITY)
Admission: EM | Admit: 2011-07-19 | Discharge: 2011-07-19 | Disposition: A | Discharge status: Home / Self Care | Payer: Medicare Other | Attending: Emergency Medicine | Admitting: Emergency Medicine

## 2011-07-19 DIAGNOSIS — R300 Dysuria: Secondary | ICD-10-CM

## 2011-07-19 DIAGNOSIS — I10 Essential (primary) hypertension: Secondary | ICD-10-CM | POA: Insufficient documentation

## 2011-07-19 LAB — CBC
Hemoglobin: 11.4 g/dL — ABNORMAL LOW (ref 12.0–15.0)
MCH: 30.1 pg (ref 26.0–34.0)
MCHC: 33 g/dL (ref 30.0–36.0)
RDW: 13 % (ref 11.5–15.5)

## 2011-07-19 LAB — BASIC METABOLIC PANEL
BUN: 8 mg/dL (ref 6–23)
Creatinine, Ser: 0.65 mg/dL (ref 0.50–1.10)
GFR calc Af Amer: >90 mL/min (ref >90)
GFR calc non Af Amer: 84 mL/min — ABNORMAL LOW (ref >90)
Potassium: 3.6 mEq/L (ref 3.5–5.1)

## 2011-07-19 LAB — DIFFERENTIAL
Basophils Absolute: 0 10*3/uL (ref 0.0–0.1)
Basophils Relative: 0 % (ref 0–1)
Eosinophils Absolute: 0.1 10*3/uL (ref 0.0–0.7)
Monocytes Relative: 8 % (ref 3–12)
Neutro Abs: 5.2 10*3/uL (ref 1.7–7.7)
Neutrophils Relative %: 71 % (ref 43–77)

## 2011-07-19 LAB — URINALYSIS, ROUTINE W REFLEX MICROSCOPIC
Bilirubin Urine: NEGATIVE
Ketones, ur: NEGATIVE mg/dL
Leukocytes, UA: NEGATIVE
Nitrite: NEGATIVE
Specific Gravity, Urine: 1.005 (ref 1.005–1.030)
Urobilinogen, UA: 0.2 mg/dL (ref 0.0–1.0)

## 2011-07-19 MED ORDER — SODIUM CHLORIDE 0.9 % IV BOLUS (SEPSIS)
1000.0000 mL | Freq: Once | INTRAVENOUS | Status: AC
  Administered 2011-07-19: 1000 mL via INTRAVENOUS

## 2011-07-19 MED ORDER — SODIUM CHLORIDE 0.9 % IV SOLN: Freq: Once | INTRAVENOUS | Status: DC

## 2011-07-19 NOTE — Discharge Instructions (Signed)

## 2011-07-19 NOTE — ED Notes (Signed)
Pt. Has a hx of UTI and began having burning, and hesitancy.  Pt. Is also having diahreea

## 2011-07-19 NOTE — ED Provider Notes (Addendum)
History     CSN: [id number]  Arrival date & time 07/19/11  4098   First MD Initiated Contact with Patient 07/19/11 0820      Chief Complaint  Patient presents with  . Dysuria    (Consider location/radiation/quality/duration/timing/severity/associated sxs/prior treatment) Patient is a 78 y.o. female presenting with dysuria. The history is provided by the patient.  Dysuria    patient here with dysuria and urinary urgency x24 hours. History of urinary tract infections and this is similar. Some loose stools, it denies any fever or diarrhea. No flank pain. Does note suprapubic pressure. Has been using over-the-counter medication without relief. Denies any cough or shortness of breath. Pain worse with urination described as sharp and made better with nothing  Past Medical History  Diagnosis Date  . Back pain   . Hypertension     Past Surgical History  Procedure Date  . Back surgery x 3    . Abdominal hysterectomy   . Appendectomy   . Cholecystectomy   . Knee surgery     No family history on file.  History  Substance Use Topics  . Smoking status: Never Smoker   . Smokeless tobacco: Not on file  . Alcohol Use: No    OB History    Grav Para Term Preterm Abortions TAB SAB Ect Mult Living                  Review of Systems  Genitourinary: Positive for dysuria.  All other systems reviewed and are negative.    Allergies  Morphine  Home Medications   Current Outpatient Rx  Name Route Sig Dispense Refill  . ADVAIR DISKUS 250-50 MCG/DOSE IN AEPB      . ALBUTEROL SULFATE HFA 108 (90 BASE) MCG/ACT IN AERS Inhalation Inhale 2 puffs into the lungs every 6 (six) hours as needed.    . ALPRAZOLAM 1 MG PO TABS      . CALCITONIN (SALMON) 200 UNIT/ACT NA SOLN Nasal Place 1 spray into the nose daily.    Marland Kitchen CALTRATE 600+D PO Oral Take by mouth.    . CHLORDIAZEPOXIDE HCL 10 MG PO CAPS      . CHOLECALCIFEROL 400 UNITS PO TABS Oral Take 1,000 Units by mouth.    Di Kindle SULFATE  325 (65 FE) MG PO TABS Oral Take 325 mg by mouth daily with breakfast.    . FLUOXETINE HCL 20 MG PO TABS Oral Take 20 mg by mouth daily.    . FUROSEMIDE 40 MG PO TABS      . KLOR-CON M20 20 MEQ PO TBCR      . LACTULOSE 10 GM/15ML PO SOLN      . METOPROLOL SUCCINATE ER 50 MG PO TB24 Oral Take 100 mg by mouth daily. Take with or immediately following a meal.    . OXYCODONE-ACETAMINOPHEN 10-325 MG PO TABS Oral Take 1 tablet by mouth every 4 (four) hours as needed.    Marland Kitchen POTASSIUM CHLORIDE 20 MEQ PO PACK Oral Take 20 mEq by mouth 2 (two) times daily.    . SULFAMETHOXAZOLE-TMP DS 800-160 MG PO TABS      . TIZANIDINE HCL 2 MG PO TABS      . VITAMIN B-12 1000 MCG PO TABS Oral Take 1,000 mcg by mouth daily.    Marland Kitchen ZOLPIDEM TARTRATE 10 MG PO TABS        BP 150/89  Pulse 78  Temp(Src) 98.1 F (36.7 C) (Oral)  Resp 18  SpO2 98%  Physical Exam  Nursing note and vitals reviewed. Constitutional: She is oriented to person, place, and time. She appears well-developed and well-nourished.  Non-toxic appearance. No distress.  HENT:  Head: Normocephalic and atraumatic.  Eyes: Conjunctivae, EOM and lids are normal. Pupils are equal, round, and reactive to light.  Neck: Normal range of motion. Neck supple. No tracheal deviation present. No mass present.  Cardiovascular: Normal rate, regular rhythm and normal heart sounds.  Exam reveals no gallop.   No murmur heard. Pulmonary/Chest: Effort normal and breath sounds normal. No stridor. No respiratory distress. She has no decreased breath sounds. She has no wheezes. She has no rhonchi. She has no rales.  Abdominal: Soft. Normal appearance and bowel sounds are normal. She exhibits no distension. There is no tenderness. There is no rigidity, no rebound, no guarding and no CVA tenderness.  Musculoskeletal: Normal range of motion. She exhibits no edema and no tenderness.  Neurological: She is alert and oriented to person, place, and time. She has normal strength. No  cranial nerve deficit or sensory deficit. GCS eye subscore is 4. GCS verbal subscore is 5. GCS motor subscore is 6.  Skin: Skin is warm and dry. No abrasion and no rash noted.  Psychiatric: She has a normal mood and affect. Her speech is normal and behavior is normal.    ED Course  Procedures (including critical care time)  Labs Reviewed - No data to display No results found.   No diagnosis found.    MDM  Patient given IV fluids and feels better at this time. Abdomen reexamined at time of discharge remained stable and nonsurgical. Patient will follow with her Dr.    Rock Nephew denies vaginal bleeding or discharge     Toy Baker, MD 07/19/11 1100  Toy Baker, MD 07/19/11 1101

## 2011-07-19 NOTE — ED Notes (Signed)
Foley cath inserted using sterile technique. Clear urine return. Pt tolerated well.  

## 2011-07-20 LAB — URINE CULTURE

## 2011-10-08 DIAGNOSIS — J45909 Unspecified asthma, uncomplicated: Secondary | ICD-10-CM | POA: Insufficient documentation

## 2011-11-26 ENCOUNTER — Ambulatory Visit (HOSPITAL_COMMUNITY)
Admission: RE | Admit: 2011-11-26 | Discharge: 2011-11-26 | Disposition: A | Discharge status: Home / Self Care | Payer: Medicare Other | Source: Ambulatory Visit | Attending: Internal Medicine | Admitting: Internal Medicine

## 2011-11-26 ENCOUNTER — Other Ambulatory Visit (HOSPITAL_COMMUNITY): Payer: Self-pay | Admitting: Internal Medicine

## 2011-11-26 DIAGNOSIS — I803 Phlebitis and thrombophlebitis of lower extremities, unspecified: Secondary | ICD-10-CM

## 2012-02-17 ENCOUNTER — Other Ambulatory Visit (HOSPITAL_COMMUNITY): Payer: Self-pay | Admitting: Internal Medicine

## 2012-02-17 ENCOUNTER — Ambulatory Visit (HOSPITAL_COMMUNITY)
Admission: RE | Admit: 2012-02-17 | Discharge: 2012-02-17 | Disposition: A | Discharge status: Home / Self Care | Payer: Medicare Other | Source: Ambulatory Visit | Attending: Internal Medicine | Admitting: Internal Medicine

## 2012-02-17 DIAGNOSIS — M25439 Effusion, unspecified wrist: Secondary | ICD-10-CM | POA: Insufficient documentation

## 2012-02-17 DIAGNOSIS — M25539 Pain in unspecified wrist: Secondary | ICD-10-CM | POA: Insufficient documentation

## 2012-02-17 DIAGNOSIS — W19XXXA Unspecified fall, initial encounter: Secondary | ICD-10-CM | POA: Insufficient documentation

## 2012-02-17 DIAGNOSIS — S63509A Unspecified sprain of unspecified wrist, initial encounter: Secondary | ICD-10-CM

## 2012-02-17 DIAGNOSIS — S6990XA Unspecified injury of unspecified wrist, hand and finger(s), initial encounter: Secondary | ICD-10-CM | POA: Insufficient documentation

## 2012-02-17 DIAGNOSIS — S59909A Unspecified injury of unspecified elbow, initial encounter: Secondary | ICD-10-CM | POA: Insufficient documentation

## 2012-03-10 ENCOUNTER — Ambulatory Visit (INDEPENDENT_AMBULATORY_CARE_PROVIDER_SITE_OTHER): Payer: Medicare Other | Admitting: Internal Medicine

## 2012-03-24 ENCOUNTER — Encounter (INDEPENDENT_AMBULATORY_CARE_PROVIDER_SITE_OTHER): Payer: Self-pay | Admitting: Internal Medicine

## 2012-03-24 ENCOUNTER — Ambulatory Visit (INDEPENDENT_AMBULATORY_CARE_PROVIDER_SITE_OTHER): Payer: Medicare Other | Admitting: Internal Medicine

## 2012-03-24 VITALS — BP 108/70 | HR 68 | Temp 97.8°F | Ht 61.0 in | Wt 199.4 lb

## 2012-03-24 DIAGNOSIS — K5900 Constipation, unspecified: Secondary | ICD-10-CM

## 2012-03-24 NOTE — Patient Instructions (Addendum)
Continue Lactulose. OV in 1 yr. Any problems call our office.

## 2012-03-24 NOTE — Progress Notes (Signed)
Subjective:     Patient ID: Lisa Saunders, female   DOB: 18-Jan-1933, 78 y.o.   MRN: [id number]  HPI Here for a scheduled visit. She tells me her stools have been black with the iron. She c/o constipation today.  She takes Lactulose for her constipation.  She sometimes have diarrhea. She tells me she has fibromyalgia. She c/o back pain. She recently underwent back surgery 10 weeks ago. She also tells me the heel of her left foot has been hurting her. There has been no injury.  Appetite is good. No weight loss. No abdominal pain. No bright red rectal bleeding. She denies acid reflux.    Her last colonoscopy attempt was 4 yr ago. She underwent a Barium enema and was normal. She c/o rectal burning and itching with BM.   CBC    Component Value Date/Time   WBC 7.3 07/19/2011 0833   RBC 3.79* 07/19/2011 0833   HGB 11.4* 07/19/2011 0833   HCT 34.5* 07/19/2011 0833   PLT 286 07/19/2011 0833   MCV 91.0 07/19/2011 0833   MCH 30.1 07/19/2011 0833   MCHC 33.0 07/19/2011 0833   RDW 13.0 07/19/2011 0833   LYMPHSABS 1.5 07/19/2011 0833   MONOABS 0.6 07/19/2011 0833   EOSABS 0.1 07/19/2011 0833   BASOSABS 0.0 07/19/2011 0833      Review of Systems see hpi Current Outpatient Prescriptions  Medication Sig Dispense Refill  . ADVAIR DISKUS 250-50 MCG/DOSE AEPB Inhale 1 puff into the lungs 2 (two) times daily.       Marland Kitchen albuterol (PROVENTIL HFA;VENTOLIN HFA) 108 (90 BASE) MCG/ACT inhaler Inhale 2 puffs into the lungs every 6 (six) hours as needed. For shortness of breath      . ALPRAZolam (XANAX) 1 MG tablet Take 1 mg by mouth 3 (three) times daily as needed. For anxiety      . calcitonin, salmon, (MIACALCIN/FORTICAL) 200 UNIT/ACT nasal spray Place 1 spray into the nose daily.      . Calcium Carbonate-Vitamin D (CALTRATE 600+D PO) Take by mouth.      . cholecalciferol (VITAMIN D) 1000 UNITS tablet Take 1,000 Units by mouth daily.      . ferrous sulfate 325 (65 FE) MG tablet Take 325 mg by mouth 2 (two) times  daily after a meal.       . FLUoxetine (PROZAC) 20 MG tablet Take 20 mg by mouth daily.      . furosemide (LASIX) 40 MG tablet Take by mouth 2 (two) times daily.       Marland Kitchen KLOR-CON M20 20 MEQ tablet Take 20 mEq by mouth 2 (two) times daily.       . metoprolol (LOPRESSOR) 50 MG tablet Take 50 mg by mouth 2 (two) times daily.      Marland Kitchen oxyCODONE-acetaminophen (PERCOCET) 5-325 MG per tablet Take 0.5-1 tablets by mouth every 4 (four) hours as needed. For pain      . potassium chloride (KLOR-CON) 20 MEQ packet Take 20 mEq by mouth 2 (two) times daily.      . vitamin B-12 (CYANOCOBALAMIN) 1000 MCG tablet Take 1,000 mcg by mouth daily.      Marland Kitchen zolpidem (AMBIEN) 10 MG tablet Take 10 mg by mouth at bedtime as needed.       Current Outpatient Prescriptions  Medication Sig Dispense Refill  . ADVAIR DISKUS 250-50 MCG/DOSE AEPB Inhale 1 puff into the lungs 2 (two) times daily.       Marland Kitchen albuterol (PROVENTIL HFA;VENTOLIN HFA) 108 (  90 BASE) MCG/ACT inhaler Inhale 2 puffs into the lungs every 6 (six) hours as needed. For shortness of breath      . ALPRAZolam (XANAX) 1 MG tablet Take 1 mg by mouth 3 (three) times daily as needed. For anxiety      . calcitonin, salmon, (MIACALCIN/FORTICAL) 200 UNIT/ACT nasal spray Place 1 spray into the nose daily.      . Calcium Carbonate-Vitamin D (CALTRATE 600+D PO) Take by mouth.      . cholecalciferol (VITAMIN D) 1000 UNITS tablet Take 1,000 Units by mouth daily.      . ferrous sulfate 325 (65 FE) MG tablet Take 325 mg by mouth 2 (two) times daily after a meal.       . FLUoxetine (PROZAC) 20 MG tablet Take 20 mg by mouth daily.      . furosemide (LASIX) 40 MG tablet Take by mouth 2 (two) times daily.       Marland Kitchen KLOR-CON M20 20 MEQ tablet Take 20 mEq by mouth 2 (two) times daily.       . metoprolol (LOPRESSOR) 50 MG tablet Take 50 mg by mouth 2 (two) times daily.      Marland Kitchen oxyCODONE-acetaminophen (PERCOCET) 5-325 MG per tablet Take 0.5-1 tablets by mouth every 4 (four) hours as needed.  For pain      . potassium chloride (KLOR-CON) 20 MEQ packet Take 20 mEq by mouth 2 (two) times daily.      . vitamin B-12 (CYANOCOBALAMIN) 1000 MCG tablet Take 1,000 mcg by mouth daily.      Marland Kitchen zolpidem (AMBIEN) 10 MG tablet Take 10 mg by mouth at bedtime as needed.       Past Medical History  Diagnosis Date  . Back pain   . Hypertension    Allergies  Allergen Reactions  . Morphine         Objective:   Physical Exam Filed Vitals:   03/24/12 1409  BP: 108/70  Pulse: 68  Temp: 97.8 F (36.6 C)  Height: 5\' 1"  (1.549 m)  Weight: 199 lb 6.4 oz (90.447 kg)   Alert and oriented. Skin warm and dry. Oral mucosa is moist.   . Sclera anicteric, conjunctivae is pink. Thyroid not enlarged. No cervical lymphadenopathy. Lungs clear. Heart regular rate and rhythm.  Abdomen is soft. Bowel sounds are positive. No hepatomegaly. No abdominal masses felt. No tenderness.  No edema to lower extremities. Stool brown and guaiac negative.    Assessment:   Here for wellness check. She seems to doing okay except for her back pain and heel pain. She is not having any GI problems at this time. Occasionally constipation which she take Lactulose for    Plan:   Will follow up in one year. Continue the Lactulose.

## 2012-05-10 ENCOUNTER — Ambulatory Visit (INDEPENDENT_AMBULATORY_CARE_PROVIDER_SITE_OTHER): Payer: Medicare Other | Admitting: Internal Medicine

## 2012-07-06 ENCOUNTER — Other Ambulatory Visit (HOSPITAL_COMMUNITY): Payer: Medicare Other

## 2012-07-12 NOTE — Patient Instructions (Addendum)
Your procedure is scheduled on: 07/19/2012  Report to [facility] at  700   AM.  Call this number if you have problems the morning of surgery: [phone]   Do not eat food or drink liquids :After Midnight.      Take these medicines the morning of surgery with A SIP OF WATER: xanax,prozac,lopressor,percocet   Do not wear jewelry, make-up or nail polish.  Do not wear lotions, powders, or perfumes.   Do not shave 48 hours prior to surgery.  Do not bring valuables to the hospital.  Contacts, dentures or bridgework may not be worn into surgery.  Leave suitcase in the car. After surgery it may be brought to your room.  For patients admitted to the hospital, checkout time is 11:00 AM the day of discharge.   Patients discharged the day of surgery will not be allowed to drive home.  :     Please read over the following fact sheets that you were given: Coughing and Deep Breathing, Surgical Site Infection Prevention, Anesthesia Post-op Instructions and Care and Recovery After Surgery    Cataract A cataract is a clouding of the lens of the eye. When a lens becomes cloudy, vision is reduced based on the degree and nature of the clouding. Many cataracts reduce vision to some degree. Some cataracts make people more near-sighted as they develop. Other cataracts increase glare. Cataracts that are ignored and become worse can sometimes look white. The white color can be seen through the pupil. CAUSES   Aging. However, cataracts may occur at any age, even in newborns.   Certain drugs.   Trauma to the eye.   Certain diseases such as diabetes.   Specific eye diseases such as chronic inflammation inside the eye or a sudden attack of a rare form of glaucoma.   Inherited or acquired medical problems.  SYMPTOMS   Gradual, progressive drop in vision in the affected eye.   Severe, rapid visual loss. This most often happens when trauma is the cause.  DIAGNOSIS  To detect a cataract, an eye doctor  examines the lens. Cataracts are best diagnosed with an exam of the eyes with the pupils enlarged (dilated) by drops.  TREATMENT  For an early cataract, vision may improve by using different eyeglasses or stronger lighting. If that does not help your vision, surgery is the only effective treatment. A cataract needs to be surgically removed when vision loss interferes with your everyday activities, such as driving, reading, or watching TV. A cataract may also have to be removed if it prevents examination or treatment of another eye problem. Surgery removes the cloudy lens and usually replaces it with a substitute lens (intraocular lens, IOL).  At a time when both you and your doctor agree, the cataract will be surgically removed. If you have cataracts in both eyes, only one is usually removed at a time. This allows the operated eye to heal and be out of danger from any possible problems after surgery (such as infection or poor wound healing). In rare cases, a cataract may be doing damage to your eye. In these cases, your caregiver may advise surgical removal right away. The vast majority of people who have cataract surgery have better vision afterward. HOME CARE INSTRUCTIONS  If you are not planning surgery, you may be asked to do the following:  Use different eyeglasses.   Use stronger or brighter lighting.   Ask your eye doctor about reducing your medicine dose or changing medicines  if it is thought that a medicine caused your cataract. Changing medicines does not make the cataract go away on its own.   Become familiar with your surroundings. Poor vision can lead to injury. Avoid bumping into things on the affected side. You are at a higher risk for tripping or falling.   Exercise extreme care when driving or operating machinery.   Wear sunglasses if you are sensitive to bright light or experiencing problems with glare.  SEEK IMMEDIATE MEDICAL CARE IF:   You have a worsening or sudden vision  loss.   You notice redness, swelling, or increasing pain in the eye.   You have a fever.  Document Released: 04/07/2005 Document Revised: 03/27/2011 Document Reviewed: 11/29/2010 [facility]ExitCare Patient Information 2012 [place]Care, [place]LL[place].PATIENT INSTRUCTIONS POST-ANESTHESIA  IMMEDIATELY FOLLOWING SURGERY:  Do not drive or operate machinery for the first twenty four hours after surgery.  Do not make any important decisions for twenty four hours after surgery or while taking narcotic pain medications or sedatives.  If you develop intractable nausea and vomiting or a severe headache please notify your doctor immediately.  FOLLOW-UP:  Please make an appointment with your surgeon as instructed. You do not need to follow up with anesthesia unless specifically instructed to do so.  WOUND CARE INSTRUCTIONS (if applicable):  Keep a dry clean dressing on the anesthesia/puncture wound site if there is drainage.  Once the wound has quit draining you may leave it open to air.  Generally you should leave the bandage intact for twenty four hours unless there is drainage.  If the epidural site drains for more than 36-48 hours please call the anesthesia department.  QUESTIONS?:  Please feel free to call your physician or the hospital operator if you have any questions, and they will be happy to assist you.

## 2012-07-13 ENCOUNTER — Encounter (HOSPITAL_COMMUNITY)
Admission: RE | Admit: 2012-07-13 | Discharge: 2012-07-13 | Disposition: A | Discharge status: Home / Self Care | Payer: Medicare Other | Source: Ambulatory Visit | Attending: Ophthalmology | Admitting: Ophthalmology

## 2012-07-13 ENCOUNTER — Encounter (HOSPITAL_COMMUNITY): Payer: Self-pay

## 2012-07-13 ENCOUNTER — Encounter (HOSPITAL_COMMUNITY): Payer: Self-pay | Admitting: Pharmacy Technician

## 2012-07-13 ENCOUNTER — Other Ambulatory Visit: Payer: Self-pay

## 2012-07-13 HISTORY — DX: Other complications of anesthesia, initial encounter: T88.59XA

## 2012-07-13 HISTORY — DX: Adverse effect of unspecified anesthetic, initial encounter: T41.45XA

## 2012-07-13 LAB — BASIC METABOLIC PANEL
GFR calc Af Amer: 62 mL/min — ABNORMAL LOW (ref >90)
GFR calc non Af Amer: 54 mL/min — ABNORMAL LOW (ref >90)
Potassium: 4.3 mEq/L (ref 3.5–5.1)
Sodium: 137 mEq/L (ref 135–145)

## 2012-07-13 LAB — SURGICAL PCR SCREEN
MRSA, PCR: POSITIVE — AB
Staphylococcus aureus: POSITIVE — AB

## 2012-07-13 LAB — HEMOGLOBIN AND HEMATOCRIT, BLOOD: Hemoglobin: 12.3 g/dL (ref 12.0–15.0)

## 2012-07-13 MED ORDER — CYCLOPENTOLATE-PHENYLEPHRINE 0.2-1 % OP SOLN
OPHTHALMIC | Status: AC
  Filled 2012-07-13: qty 2

## 2012-07-19 ENCOUNTER — Ambulatory Visit (HOSPITAL_COMMUNITY)
Admission: RE | Admit: 2012-07-19 | Discharge: 2012-07-19 | Disposition: A | Discharge status: Home / Self Care | Payer: Medicare Other | Source: Ambulatory Visit | Attending: Ophthalmology | Admitting: Ophthalmology

## 2012-07-19 ENCOUNTER — Encounter (HOSPITAL_COMMUNITY): Payer: Self-pay | Admitting: Anesthesiology

## 2012-07-19 ENCOUNTER — Ambulatory Visit (HOSPITAL_COMMUNITY): Payer: Medicare Other | Admitting: Anesthesiology

## 2012-07-19 ENCOUNTER — Encounter (HOSPITAL_COMMUNITY): Payer: Self-pay | Admitting: *Deleted

## 2012-07-19 ENCOUNTER — Encounter (HOSPITAL_COMMUNITY)
Admission: RE | Disposition: A | Discharge status: Home / Self Care | Payer: Self-pay | Source: Ambulatory Visit | Attending: Ophthalmology

## 2012-07-19 DIAGNOSIS — Z01810 Encounter for preprocedural cardiovascular examination: Secondary | ICD-10-CM | POA: Insufficient documentation

## 2012-07-19 DIAGNOSIS — Z01812 Encounter for preprocedural laboratory examination: Secondary | ICD-10-CM | POA: Insufficient documentation

## 2012-07-19 DIAGNOSIS — H2510 Age-related nuclear cataract, unspecified eye: Secondary | ICD-10-CM | POA: Insufficient documentation

## 2012-07-19 HISTORY — PX: CATARACT EXTRACTION W/PHACO: SHX586

## 2012-07-19 SURGERY — PHACOEMULSIFICATION, CATARACT, WITH IOL INSERTION
Anesthesia: Monitor Anesthesia Care | Site: Eye | Laterality: Left | Wound class: Clean

## 2012-07-19 MED ORDER — LIDOCAINE HCL 3.5 % OP GEL
OPHTHALMIC | Status: AC
  Filled 2012-07-19: qty 5

## 2012-07-19 MED ORDER — BSS IO SOLN
INTRAOCULAR | Status: DC | PRN
  Administered 2012-07-19: 15 mL via INTRAOCULAR

## 2012-07-19 MED ORDER — GATIFLOXACIN 0.5 % OP SOLN OPTIME - NO CHARGE
OPHTHALMIC | Status: DC | PRN
  Administered 2012-07-19: 1 [drp] via OPHTHALMIC

## 2012-07-19 MED ORDER — TETRACAINE HCL 0.5 % OP SOLN
OPHTHALMIC | Status: AC
  Filled 2012-07-19: qty 2

## 2012-07-19 MED ORDER — KETOROLAC TROMETHAMINE 0.5 % OP SOLN
1.0000 [drp] | Freq: Once | OPHTHALMIC | Status: AC
  Administered 2012-07-19: 1 [drp] via OPHTHALMIC

## 2012-07-19 MED ORDER — CYCLOPENTOLATE-PHENYLEPHRINE 0.2-1 % OP SOLN
1.0000 [drp] | Freq: Once | OPHTHALMIC | Status: AC
  Administered 2012-07-19: 1 [drp] via OPHTHALMIC

## 2012-07-19 MED ORDER — LACTATED RINGERS IV SOLN
INTRAVENOUS | Status: DC
  Administered 2012-07-19: 1000 mL via INTRAVENOUS

## 2012-07-19 MED ORDER — TETRACAINE 0.5 % OP SOLN OPTIME - NO CHARGE
OPHTHALMIC | Status: DC | PRN
  Administered 2012-07-19: 2 [drp] via OPHTHALMIC

## 2012-07-19 MED ORDER — LIDOCAINE 3.5 % OP GEL OPTIME - NO CHARGE
OPHTHALMIC | Status: DC | PRN
  Administered 2012-07-19: 2 [drp] via OPHTHALMIC

## 2012-07-19 MED ORDER — GATIFLOXACIN 0.5 % OP SOLN OPTIME - NO CHARGE
1.0000 [drp] | Freq: Once | OPHTHALMIC | Status: AC
  Administered 2012-07-19: 1 [drp] via OPHTHALMIC
  Filled 2012-07-19: qty 2.5

## 2012-07-19 MED ORDER — MIDAZOLAM HCL 2 MG/2ML IJ SOLN
1.0000 mg | INTRAMUSCULAR | Status: DC | PRN
  Administered 2012-07-19: 2 mg via INTRAVENOUS

## 2012-07-19 MED ORDER — ONDANSETRON HCL 4 MG/2ML IJ SOLN: 4.0000 mg | Freq: Once | INTRAMUSCULAR | Status: DC | PRN

## 2012-07-19 MED ORDER — EPINEPHRINE HCL 1 MG/ML IJ SOLN
INTRAMUSCULAR | Status: AC
  Filled 2012-07-19: qty 1

## 2012-07-19 MED ORDER — MIDAZOLAM HCL 2 MG/2ML IJ SOLN
INTRAMUSCULAR | Status: AC
  Filled 2012-07-19: qty 2

## 2012-07-19 MED ORDER — FENTANYL CITRATE 0.05 MG/ML IJ SOLN: 25.0000 ug | INTRAMUSCULAR | Status: DC | PRN

## 2012-07-19 MED ORDER — EPINEPHRINE HCL 1 MG/ML IJ SOLN
INTRAOCULAR | Status: DC | PRN
  Administered 2012-07-19: 09:00:00

## 2012-07-19 MED ORDER — NA HYALUR & NA CHOND-NA HYALUR 0.55-0.5 ML IO KIT
PACK | INTRAOCULAR | Status: DC | PRN
  Administered 2012-07-19: 1 via OPHTHALMIC

## 2012-07-19 MED ORDER — POVIDONE-IODINE 5 % OP SOLN
OPHTHALMIC | Status: DC | PRN
  Administered 2012-07-19: 1 via OPHTHALMIC

## 2012-07-19 MED ORDER — MOXIFLOXACIN HCL 0.5 % OP SOLN - NO CHARGE
1.0000 [drp] | Freq: Once | OPHTHALMIC | Status: DC
  Filled 2012-07-19: qty 3

## 2012-07-19 SURGICAL SUPPLY — 27 items
CAPSULAR TENSION RING-AMO (OPHTHALMIC RELATED) IMPLANT
CLOTH BEACON ORANGE TIMEOUT ST (SAFETY) ×1 IMPLANT
GLOVE BIO SURGEON STRL SZ7.5 (GLOVE) IMPLANT
GLOVE BIOGEL M 6.5 STRL (GLOVE) IMPLANT
GLOVE BIOGEL PI IND STRL 6.5 (GLOVE) IMPLANT
GLOVE BIOGEL PI IND STRL 7.0 (GLOVE) IMPLANT
GLOVE BIOGEL PI INDICATOR 6.5 (GLOVE) ×1
GLOVE BIOGEL PI INDICATOR 7.0 (GLOVE) ×1
GLOVE ECLIPSE 6.5 STRL STRAW (GLOVE) IMPLANT
GLOVE ECLIPSE 7.5 STRL STRAW (GLOVE) IMPLANT
GLOVE EXAM NITRILE LRG STRL (GLOVE) IMPLANT
GLOVE EXAM NITRILE MD LF STRL (GLOVE) IMPLANT
GLOVE SKINSENSE NS SZ6.5 (GLOVE)
GLOVE SKINSENSE NS SZ7.0 (GLOVE)
GLOVE SKINSENSE STRL SZ6.5 (GLOVE) IMPLANT
GLOVE SKINSENSE STRL SZ7.0 (GLOVE) IMPLANT
INST SET CATARACT ~~LOC~~ (KITS) ×2 IMPLANT
KIT VITRECTOMY (OPHTHALMIC RELATED) IMPLANT
PAD ARMBOARD 7.5X6 YLW CONV (MISCELLANEOUS) ×2 IMPLANT
PROC W NO LENS (INTRAOCULAR LENS)
PROC W SPEC LENS (INTRAOCULAR LENS)
PROCESS W NO LENS (INTRAOCULAR LENS) IMPLANT
PROCESS W SPEC LENS (INTRAOCULAR LENS) IMPLANT
RING MALYGIN (MISCELLANEOUS) IMPLANT
SIGHTPATH CAT PROC W REG LENS (Ophthalmic Related) ×2 IMPLANT
VISCOELASTIC ADDITIONAL (OPHTHALMIC RELATED) IMPLANT
WATER STERILE IRR 250ML POUR (IV SOLUTION) ×1 IMPLANT

## 2012-07-19 NOTE — Op Note (Signed)
See scanned note.

## 2012-07-19 NOTE — Anesthesia Postprocedure Evaluation (Signed)
  Anesthesia Post-op Note  Patient: Lisa Saunders  Procedure(s) Performed: Procedure(s) (LRB): CATARACT EXTRACTION PHACO AND INTRAOCULAR LENS PLACEMENT (IOC) (Left)  Patient Location:  Short Stay  Anesthesia Type: MAC  Level of Consciousness: awake  Airway and Oxygen Therapy: Patient Spontanous Breathing  Post-op Pain: none  Post-op Assessment: Post-op Vital signs reviewed, Patient's Cardiovascular Status Stable, Respiratory Function Stable, Patent Airway, No signs of Nausea or vomiting and Pain level controlled  Post-op Vital Signs: Reviewed and stable  Complications: No apparent anesthesia complications

## 2012-07-19 NOTE — Brief Op Note (Signed)
07/19/2012  9:38 AM  PATIENT:  Kingsley Callander  79 y.o. female  PRE-OPERATIVE DIAGNOSIS:  nuclear cataract left eye  POST-OPERATIVE DIAGNOSIS:  nuclear cataract left eye  PROCEDURE:  Procedure(s): CATARACT EXTRACTION PHACO AND INTRAOCULAR LENS PLACEMENT (IOC)  SURGEON:  Surgeon(s): Susa Simmonds, MD  ASSISTANTS:   Trenton Founds, CST  ANESTHESIA STAFF: Anesthesiologist: Laurene Footman, MD CRNA: Franco Nones, CRNA  ANESTHESIA:   topical and MAC  REQUESTED LENS POWER: 24.0  LENS IMPLANT INFORMATION:  [employer]con [id number]   S/n [id number]  Exp 07/2016   CUMULATIVE DISSIPATED ENERGY:12.60  INDICATIONS:see H&P for details  OP FINDINGS:done NS  COMPLICATIONS:None  DICTATION #: see scanned note  PLAN OF CARE: KPE w IOL today  PATIENT DISPOSITION:  Short Stay

## 2012-07-19 NOTE — Anesthesia Procedure Notes (Signed)
Procedure Name: MAC Date/Time: 07/19/2012 8:35 AM Performed by: Franco Nones Pre-anesthesia Checklist: Patient identified, Emergency Drugs available, Suction available, Timeout performed and Patient being monitored Patient Re-evaluated:Patient Re-evaluated prior to inductionOxygen Delivery Method: Nasal Cannula

## 2012-07-19 NOTE — H&P (Signed)
I have reviewed the pre printed H&P, the patient was re-examined, and I have identified no significant interval changes in the patient's medical condition.  There is no change in the plan of care since the history and physical of record. 

## 2012-07-19 NOTE — Transfer of Care (Signed)
Immediate Anesthesia Transfer of Care Note  Patient: Lisa Saunders  Procedure(s) Performed: Procedure(s) (LRB): CATARACT EXTRACTION PHACO AND INTRAOCULAR LENS PLACEMENT (IOC) (Left)  Patient Location: Shortstay  Anesthesia Type: MAC  Level of Consciousness: awake  Airway & Oxygen Therapy: Patient Spontanous Breathing   Post-op Assessment: Report given to PACU RN, Post -op Vital signs reviewed and stable and Patient moving all extremities  Post vital signs: Reviewed and stable  Complications: No apparent anesthesia complications

## 2012-07-19 NOTE — Anesthesia Preprocedure Evaluation (Signed)
Anesthesia Evaluation  Patient identified by MRN, date of birth, ID band Patient awake    Reviewed: Allergy & Precautions, H&P , NPO status , Patient's Chart, lab work & pertinent test results  Airway Mallampati: III    Mouth opening: Limited Mouth Opening  Dental  (+) Teeth Intact   Pulmonary asthma , COPD COPD inhaler,  breath sounds clear to auscultation        Cardiovascular hypertension, Pt. on medications Rhythm:Regular Rate:Normal     Neuro/Psych CVA    GI/Hepatic negative GI ROS, GERD-  ,  Endo/Other    Renal/GU      Musculoskeletal   Abdominal   Peds  Hematology   Anesthesia Other Findings   Reproductive/Obstetrics                           Anesthesia Physical Anesthesia Plan  ASA: III  Anesthesia Plan: MAC   Post-op Pain Management:    Induction: Intravenous  Airway Management Planned: Nasal Cannula  Additional Equipment:   Intra-op Plan:   Post-operative Plan:   Informed Consent: I have reviewed the patients History and Physical, chart, labs and discussed the procedure including the risks, benefits and alternatives for the proposed anesthesia with the patient or authorized representative who has indicated his/her understanding and acceptance.     Plan Discussed with:   Anesthesia Plan Comments:         Anesthesia Quick Evaluation

## 2012-07-20 ENCOUNTER — Encounter (HOSPITAL_COMMUNITY): Payer: Self-pay | Admitting: Ophthalmology

## 2012-10-26 ENCOUNTER — Encounter: Payer: Self-pay | Admitting: Internal Medicine

## 2012-10-26 ENCOUNTER — Ambulatory Visit (INDEPENDENT_AMBULATORY_CARE_PROVIDER_SITE_OTHER): Payer: Medicare Other | Admitting: Internal Medicine

## 2012-10-26 VITALS — BP 124/78 | HR 57 | Ht 63.0 in | Wt 195.3 lb

## 2012-10-26 DIAGNOSIS — R600 Localized edema: Secondary | ICD-10-CM

## 2012-10-26 DIAGNOSIS — I341 Nonrheumatic mitral (valve) prolapse: Secondary | ICD-10-CM

## 2012-10-26 DIAGNOSIS — M549 Dorsalgia, unspecified: Secondary | ICD-10-CM

## 2012-10-26 DIAGNOSIS — I059 Rheumatic mitral valve disease, unspecified: Secondary | ICD-10-CM

## 2012-10-26 DIAGNOSIS — R0609 Other forms of dyspnea: Secondary | ICD-10-CM

## 2012-10-26 DIAGNOSIS — M6281 Muscle weakness (generalized): Secondary | ICD-10-CM

## 2012-10-26 DIAGNOSIS — R609 Edema, unspecified: Secondary | ICD-10-CM

## 2012-10-26 DIAGNOSIS — R0600 Dyspnea, unspecified: Secondary | ICD-10-CM

## 2012-10-26 DIAGNOSIS — R269 Unspecified abnormalities of gait and mobility: Secondary | ICD-10-CM

## 2012-10-26 NOTE — Progress Notes (Signed)
OFFICE NOTE  Chief Complaint:  Leg edema  Primary Care Physician: Cassell Smiles., MD  HPI:  Lisa Saunders is a pleasant 79 year old female with a number of medical problems including lower back pain with a recent implant of a spinal stimulator. She has lower extremity neuropathy, and struggled with lower extremity edema. She has also had knee surgery in the past as well. She is a morbidly obese, it is describing shortness of breath with exertion. She was referred for evaluation of lower extremity swelling. This is despite taking Lasix 2-3 times daily.  She reports her edema does not necessarily improve with elevation at night. There is no family history of varicose veins. She's never had these sores or abnormalities of her lower extremities. She does report pain in her left heel when she walks or bears weight on that side.  PMHx:  Past Medical History  Diagnosis Date  . Back pain   . Hypertension   . Complication of anesthesia     pt states"I was in a comaor 5 days after my 3rd back surgery and no one knows why". husband states she hasnt been right since then,doesnt walk right or anything" Dr Bettina Gavia was MD    Past Surgical History  Procedure Laterality Date  . Back surgery x 3     . Abdominal hysterectomy    . Appendectomy    . Cholecystectomy    . Knee surgery    . Back surgery x 4    . Cataract extraction Right   . Cataract extraction w/phaco Left 07/19/2012    Procedure: CATARACT EXTRACTION PHACO AND INTRAOCULAR LENS PLACEMENT (IOC);  Surgeon: Susa Simmonds, MD;  Location: [facility];  Service: Ophthalmology;  Laterality: Left;  CDE 12.60    FAMHx:  History reviewed. No pertinent family history.  SOCHx:   reports that she quit smoking about 48 years ago. She has never used smokeless tobacco. She reports that she does not drink alcohol or use illicit drugs.  ALLERGIES:  Allergies  Allergen Reactions  . Latex   . Morphine   . Prednisone     Makes me crazy and  mean  . Procardia (Nifedipine) Other (See Comments)    unknown    ROS: A comprehensive review of systems was negative except for: Respiratory: positive for dyspnea on exertion Cardiovascular: positive for lower extremity edema Musculoskeletal: positive for arthralgias and back pain  HOME MEDS: Current Outpatient Prescriptions  Medication Sig Dispense Refill  . ADVAIR DISKUS 250-50 MCG/DOSE AEPB Inhale 1 puff into the lungs 2 (two) times daily.       Marland Kitchen albuterol (PROVENTIL HFA;VENTOLIN HFA) 108 (90 BASE) MCG/ACT inhaler Inhale 2 puffs into the lungs every 6 (six) hours as needed. For shortness of breath      . ALPRAZolam (XANAX) 1 MG tablet Take 1 mg by mouth 4 (four) times daily as needed. For anxiety      . calcitonin, salmon, (MIACALCIN/FORTICAL) 200 UNIT/ACT nasal spray Place 1 spray into the nose daily.      . Calcium Carbonate-Vitamin D (CALTRATE 600+D PO) Take by mouth.      . cholecalciferol (VITAMIN D) 1000 UNITS tablet Take 1,000 Units by mouth daily.      . ferrous sulfate 325 (65 FE) MG tablet Take 325 mg by mouth 2 (two) times daily after a meal.       . FLUoxetine (PROZAC) 20 MG tablet Take 20 mg by mouth daily.      . furosemide (LASIX)  40 MG tablet Take 40 mg by mouth 3 (three) times daily.       . Garlic 1000 MG CAPS Take 1 capsule by mouth daily.      Marland Kitchen KLOR-CON M20 20 MEQ tablet Take 20 mEq by mouth 2 (two) times daily.       Marland Kitchen lactulose (CHRONULAC) 10 GM/15ML solution Take 20 g by mouth at bedtime.      . metoprolol (LOPRESSOR) 50 MG tablet Take 50 mg by mouth 2 (two) times daily.      Marland Kitchen NASONEX 50 MCG/ACT nasal spray Place 2 sprays into the nose daily as needed.      Marland Kitchen oxyCODONE-acetaminophen (PERCOCET) 5-325 MG per tablet Take 1 tablet by mouth every 6 (six) hours as needed for pain. For pain      . risperiDONE (RISPERDAL) 1 MG tablet Take 1 mg by mouth at bedtime.      Marland Kitchen tiZANidine (ZANAFLEX) 2 MG tablet Take 2 mg by mouth every 8 (eight) hours as needed.      .  vitamin B-12 (CYANOCOBALAMIN) 1000 MCG tablet Take 1,000 mcg by mouth daily.      Marland Kitchen zolpidem (AMBIEN) 10 MG tablet Take 10 mg by mouth at bedtime as needed.       No current facility-administered medications for this visit.    LABS/IMAGING: No results found for this or any previous visit (from the past 48 hour(s)). No results found.  VITALS: BP 124/78  Pulse 57  Ht 5\' 3"  (1.6 m)  Wt 195 lb 4.8 oz (88.587 kg)  BMI 34.6 kg/m2  EXAM: General appearance: alert and no distress Neck: no adenopathy, no carotid bruit, no JVD, supple, symmetrical, trachea midline and thyroid not enlarged, symmetric, no tenderness/mass/nodules Lungs: clear to auscultation bilaterally Heart: regular rate and rhythm, S1, S2 normal, no murmur, click, rub or gallop Abdomen: morbidly obese, soft, non-tender Extremities: edema 2+ pitting edema Pulses: 2+ and symmetric Skin: mild erythema of the bilateral lower extremities Neurologic: Grossly normal  EKG: Sinus bradycardia at 57, voltage criteria for LVH  ASSESSMENT: 1. Dyspnea on exertion 2. Bilateral LE edema 3. Low back pain 4. Neuropathy  PLAN: 1.   Lisa Saunders has bilateral lower extremity swelling, but no signs or symptoms of venous reflux disease. I suspect she could have deep vein reflux. Would recommend bilateral venous insufficiency studies to evaluate for this. I will also measure her and fit her for stockings. The more likely cause of her lower extremity swelling his chronic neuropathy from her lower back pain. Finally she is having increasing shortness of breath could be due to her obesity, deconditioning or coronary disease. Would like to obtain an echocardiogram to rule out systolic dysfunction. We'll see her back to followup on the studies shortly.  Chrystie Nose, MD, [facility] Attending Cardiologist [facility]  Ellianne Gowen C 10/26/2012, 5:00 PM

## 2012-10-26 NOTE — Patient Instructions (Addendum)
Your physician has requested that you have an echocardiogram. Echocardiography is a painless test that uses sound waves to create images of your heart. It provides your doctor with information about the size and shape of your heart and how well your heart's chambers and valves are working. This procedure takes approximately one hour. There are no restrictions for this procedure.  Dr. Rennis Golden has ordered a venous reflux study.  Dr. Rennis Golden has ordered compression stockings. Please wear during the day and remove at night.  Size M L Calf 16 in  L ankle 9 in R Calf 16.5 in  R ankle 9 in  Your physician recommends that you schedule a follow-up appointment in: 1 month

## 2012-11-01 ENCOUNTER — Telehealth: Payer: Self-pay | Admitting: Internal Medicine

## 2012-11-01 NOTE — Telephone Encounter (Signed)
Returned call.  Pt stated "they gave me some hose" and she couldn't get her finger in the hose to put them on b/c of her rheumatoid arthritis.  Pt stated she wants to return them and get her $60 back.  Pt informed RN will have to notify someone to handle her request as RN is unsure if they can be returned.  Pt verbalized understanding and agreed w/ plan.

## 2012-11-01 NOTE — Telephone Encounter (Signed)
Hose she received last week-she can not use them-please call!

## 2012-11-02 NOTE — Telephone Encounter (Signed)
Returned call.  Pt informed compression hose cannot be returned or refund.  Pt verbalized understanding.  Stated she went to Dr. Sherwood Gambler before she came to our office and he gave her macrobid.  Stated the swelling is going down and she doesn't need the hose.  Pt again informed they are not refundable or returnable.  Pt verbalized understanding and agreed w/ plan.

## 2012-11-02 NOTE — Telephone Encounter (Signed)
Returning your call  .. Thanks  °

## 2012-11-02 NOTE — Telephone Encounter (Signed)
K. Petra Kuba, RN notified and advised Primary school teacher, scheduling.  Joyce Gross notified and stated they are non-refundable like other undergarments.  Returned call and spoke w/ female answering.  Stated pt is not awake yet.  Agreed to have pt call back.

## 2012-11-16 ENCOUNTER — Ambulatory Visit (HOSPITAL_BASED_OUTPATIENT_CLINIC_OR_DEPARTMENT_OTHER)
Admission: RE | Admit: 2012-11-16 | Discharge: 2012-11-16 | Disposition: A | Discharge status: Home / Self Care | Payer: Medicare Other | Source: Ambulatory Visit | Attending: Cardiovascular Disease | Admitting: Cardiovascular Disease

## 2012-11-16 ENCOUNTER — Encounter (HOSPITAL_COMMUNITY): Payer: Medicare Other

## 2012-11-16 ENCOUNTER — Ambulatory Visit (HOSPITAL_COMMUNITY)
Admission: RE | Admit: 2012-11-16 | Discharge: 2012-11-16 | Disposition: A | Discharge status: Home / Self Care | Payer: Medicare Other | Source: Ambulatory Visit | Attending: Cardiovascular Disease | Admitting: Cardiovascular Disease

## 2012-11-16 DIAGNOSIS — I341 Nonrheumatic mitral (valve) prolapse: Secondary | ICD-10-CM

## 2012-11-16 DIAGNOSIS — Z87891 Personal history of nicotine dependence: Secondary | ICD-10-CM | POA: Insufficient documentation

## 2012-11-16 DIAGNOSIS — M7989 Other specified soft tissue disorders: Secondary | ICD-10-CM

## 2012-11-16 DIAGNOSIS — R600 Localized edema: Secondary | ICD-10-CM

## 2012-11-16 DIAGNOSIS — I10 Essential (primary) hypertension: Secondary | ICD-10-CM | POA: Insufficient documentation

## 2012-11-16 DIAGNOSIS — R0609 Other forms of dyspnea: Secondary | ICD-10-CM | POA: Insufficient documentation

## 2012-11-16 DIAGNOSIS — R0600 Dyspnea, unspecified: Secondary | ICD-10-CM

## 2012-11-16 DIAGNOSIS — I059 Rheumatic mitral valve disease, unspecified: Secondary | ICD-10-CM | POA: Insufficient documentation

## 2012-11-16 DIAGNOSIS — R0989 Other specified symptoms and signs involving the circulatory and respiratory systems: Secondary | ICD-10-CM | POA: Insufficient documentation

## 2012-11-16 HISTORY — PX: TRANSTHORACIC ECHOCARDIOGRAM: SHX275

## 2012-11-16 NOTE — Progress Notes (Signed)
[facility]   2D echo completed 11/16/2012.   Cindy Ezabella Teska, RDCS  

## 2012-11-16 NOTE — Progress Notes (Signed)
Lower Ext. Venous Duplex Completed. No evidence of DVT or Venous Insufficiency noted. Marilynne Halsted, RDMS, RVT

## 2012-11-26 ENCOUNTER — Ambulatory Visit: Payer: Medicare Other | Admitting: Internal Medicine

## 2012-12-09 ENCOUNTER — Encounter: Payer: Self-pay | Admitting: Internal Medicine

## 2012-12-09 ENCOUNTER — Ambulatory Visit (INDEPENDENT_AMBULATORY_CARE_PROVIDER_SITE_OTHER): Payer: Medicare Other | Admitting: Internal Medicine

## 2012-12-09 VITALS — BP 112/80 | HR 68 | Ht 63.0 in | Wt 185.0 lb

## 2012-12-09 DIAGNOSIS — M6281 Muscle weakness (generalized): Secondary | ICD-10-CM

## 2012-12-09 DIAGNOSIS — I10 Essential (primary) hypertension: Secondary | ICD-10-CM

## 2012-12-09 DIAGNOSIS — R600 Localized edema: Secondary | ICD-10-CM

## 2012-12-09 DIAGNOSIS — R609 Edema, unspecified: Secondary | ICD-10-CM

## 2012-12-09 NOTE — Patient Instructions (Addendum)
Your physician recommends that you schedule a follow-up appointment as needed.  Call if you have any chest pain or increasing shortness of breath.

## 2012-12-11 ENCOUNTER — Encounter: Payer: Self-pay | Admitting: Internal Medicine

## 2012-12-11 NOTE — Progress Notes (Signed)
OFFICE NOTE  Chief Complaint:  Leg edema  Primary Care Physician: Cassell Smiles., MD  HPI:  Lisa Saunders is a pleasant 79 year old female with a number of medical problems including lower back pain with a recent implant of a spinal stimulator. She has lower extremity neuropathy, and struggled with lower extremity edema. She has also had knee surgery in the past as well. She is a morbidly obese, it is describing shortness of breath with exertion. She was referred for evaluation of lower extremity swelling. This is despite taking Lasix 2-3 times daily.  She reports her edema does not necessarily improve with elevation at night. There is no family history of varicose veins. She's never had these sores or abnormalities of her lower extremities. She does report pain in her left heel when she walks or bears weight on that side.  She was ordered for lower extremity Dopplers at her last visit which he had performed on 11/16/12 along with an echocardiogram. The Dopplers did not indicate any venous reflux of the lower sternum and knees. Her echocardiogram showed normal systolic function with grade 2 diastolic dysfunction and elevated filling pressures. There also was mild pulmonary hypertension, likely pulmonary venous hypertension. Today she reports her swelling has improved but it comes and goes.  PMHx:  Past Medical History  Diagnosis Date  . Back pain   . Hypertension   . Complication of anesthesia     pt states"I was in a comaor 5 days after my 3rd back surgery and no one knows why". husband states she hasnt been right since then,doesnt walk right or anything" Dr Bettina Gavia was MD    Past Surgical History  Procedure Laterality Date  . Back surgery x 3     . Abdominal hysterectomy    . Appendectomy    . Cholecystectomy    . Knee surgery    . Back surgery x 4    . Cataract extraction Right   . Cataract extraction w/phaco Left 07/19/2012    Procedure: CATARACT EXTRACTION PHACO AND  INTRAOCULAR LENS PLACEMENT (IOC);  Surgeon: Susa Simmonds, MD;  Location: [facility];  Service: Ophthalmology;  Laterality: Left;  CDE 12.60    FAMHx:  History reviewed. No pertinent family history.  SOCHx:   reports that she quit smoking about 48 years ago. She has never used smokeless tobacco. She reports that she does not drink alcohol or use illicit drugs.  ALLERGIES:  Allergies  Allergen Reactions  . Latex   . Morphine   . Prednisone     Makes me crazy and mean  . Procardia [Nifedipine] Other (See Comments)    unknown    ROS: A comprehensive review of systems was negative except for: Respiratory: positive for dyspnea on exertion Cardiovascular: positive for lower extremity edema Musculoskeletal: positive for arthralgias and back pain  HOME MEDS: Current Outpatient Prescriptions  Medication Sig Dispense Refill  . ADVAIR DISKUS 250-50 MCG/DOSE AEPB Inhale 1 puff into the lungs 2 (two) times daily.       Marland Kitchen albuterol (PROVENTIL HFA;VENTOLIN HFA) 108 (90 BASE) MCG/ACT inhaler Inhale 2 puffs into the lungs every 6 (six) hours as needed. For shortness of breath      . ALPRAZolam (XANAX) 1 MG tablet Take 1 mg by mouth 4 (four) times daily as needed. For anxiety      . calcitonin, salmon, (MIACALCIN/FORTICAL) 200 UNIT/ACT nasal spray Place 1 spray into the nose daily.      . Calcium Carbonate-Vitamin D (CALTRATE  600+D PO) Take by mouth.      . cholecalciferol (VITAMIN D) 1000 UNITS tablet Take 1,000 Units by mouth daily.      . ferrous sulfate 325 (65 FE) MG tablet Take 325 mg by mouth 2 (two) times daily after a meal.       . FLUoxetine (PROZAC) 20 MG tablet Take 20 mg by mouth daily.      . furosemide (LASIX) 40 MG tablet Take 40 mg by mouth 3 (three) times daily.       . Garlic 1000 MG CAPS Take 1 capsule by mouth daily.      Marland Kitchen KLOR-CON M20 20 MEQ tablet Take 20 mEq by mouth 2 (two) times daily.       Marland Kitchen lactulose (CHRONULAC) 10 GM/15ML solution Take 20 g by mouth at bedtime.        . metoprolol (LOPRESSOR) 50 MG tablet Take 50 mg by mouth 2 (two) times daily.      Marland Kitchen NASONEX 50 MCG/ACT nasal spray Place 2 sprays into the nose daily as needed.      Marland Kitchen OVER THE COUNTER MEDICATION 2 (two) times daily. Nasocort      . oxyCODONE-acetaminophen (PERCOCET) 5-325 MG per tablet Take 1 tablet by mouth every 6 (six) hours as needed for pain. For pain      . risperiDONE (RISPERDAL) 1 MG tablet Take 1 mg by mouth at bedtime.      Marland Kitchen tiZANidine (ZANAFLEX) 2 MG tablet Take 2 mg by mouth every 8 (eight) hours as needed.      . vitamin B-12 (CYANOCOBALAMIN) 1000 MCG tablet Take 1,000 mcg by mouth daily.      Marland Kitchen zolpidem (AMBIEN) 10 MG tablet Take 10 mg by mouth at bedtime as needed.       No current facility-administered medications for this visit.    LABS/IMAGING: No results found for this or any previous visit (from the past 48 hour(s)). No results found.  VITALS: BP 112/80  Pulse 68  Ht 5\' 3"  (1.6 m)  Wt 185 lb (83.915 kg)  BMI 32.78 kg/m2  EXAM: Deferred  EKG: deferred  ASSESSMENT: 1. Dyspnea on exertion, possibly due to diastolic dysfunction 2. Bilateral LE edema - no evidence for varicose veins 3. Low back pain 4. Neuropathy  PLAN: 1.   Lisa Saunders has bilateral lower extremity swelling which could be a combination of neuropathy causing edema and/or lymphedema. There is diastolic dysfunction on echo which could also be playing a role. Unfortunately there are no procedural treatments for lower extremity swelling. Will be recommended would be compression stockings which she has difficulty wearing, and diuretics. She is currently on Lasix 40 mg 3 times a day. I recommend continuing her Lasix and adjusting it as needed for swelling.  Chrystie Nose, MD, [facility] Attending Cardiologist [facility]  Cheryl Stabenow C 12/11/2012, 11:47 AM

## 2013-02-07 ENCOUNTER — Other Ambulatory Visit (HOSPITAL_COMMUNITY): Payer: Self-pay | Admitting: Internal Medicine

## 2013-02-07 ENCOUNTER — Telehealth: Payer: Self-pay | Admitting: *Deleted

## 2013-02-07 DIAGNOSIS — M79609 Pain in unspecified limb: Secondary | ICD-10-CM

## 2013-02-07 NOTE — Telephone Encounter (Signed)
Faxed echo report to Dr. Sharyon Medicus office 02/07/13 - per Dr. Royann Shivers - spoke with Dr. Sherwood Gambler - Atrial Fibrillation? Notified scheduler of need for OV with Dr. Rennis Golden

## 2013-02-08 ENCOUNTER — Telehealth: Payer: Self-pay | Admitting: Internal Medicine

## 2013-02-08 NOTE — Telephone Encounter (Signed)
Pt needs a fu appointment with Dr. Hilty. 

## 2013-02-09 ENCOUNTER — Ambulatory Visit (HOSPITAL_COMMUNITY): Payer: Medicare Other

## 2013-02-09 NOTE — Telephone Encounter (Signed)
Pt needs an appointment with Dr. Rennis Golden per Dr. Sherwood Gambler. ASAP

## 2013-02-10 ENCOUNTER — Ambulatory Visit (HOSPITAL_COMMUNITY)
Admission: RE | Admit: 2013-02-10 | Discharge: 2013-02-10 | Disposition: A | Discharge status: Home / Self Care | Payer: Medicare Other | Source: Ambulatory Visit | Attending: Internal Medicine | Admitting: Internal Medicine

## 2013-02-10 DIAGNOSIS — M79609 Pain in unspecified limb: Secondary | ICD-10-CM | POA: Insufficient documentation

## 2013-02-20 ENCOUNTER — Encounter: Payer: Self-pay | Admitting: *Deleted

## 2013-02-22 ENCOUNTER — Ambulatory Visit: Payer: Medicare Other | Admitting: Internal Medicine

## 2013-03-03 ENCOUNTER — Encounter: Payer: Self-pay | Admitting: Internal Medicine

## 2013-03-03 ENCOUNTER — Ambulatory Visit (INDEPENDENT_AMBULATORY_CARE_PROVIDER_SITE_OTHER): Payer: Medicare Other | Admitting: Internal Medicine

## 2013-03-03 VITALS — BP 132/84 | HR 66 | Ht 61.0 in | Wt 197.3 lb

## 2013-03-03 DIAGNOSIS — I10 Essential (primary) hypertension: Secondary | ICD-10-CM

## 2013-03-03 DIAGNOSIS — I4891 Unspecified atrial fibrillation: Secondary | ICD-10-CM

## 2013-03-03 DIAGNOSIS — Z79899 Other long term (current) drug therapy: Secondary | ICD-10-CM

## 2013-03-03 DIAGNOSIS — I480 Paroxysmal atrial fibrillation: Secondary | ICD-10-CM

## 2013-03-03 DIAGNOSIS — R195 Other fecal abnormalities: Secondary | ICD-10-CM

## 2013-03-03 DIAGNOSIS — K922 Gastrointestinal hemorrhage, unspecified: Secondary | ICD-10-CM

## 2013-03-03 LAB — CBC
Platelets: 385 10*3/uL (ref 150–400)
RBC: 3.83 MIL/uL — ABNORMAL LOW (ref 3.87–5.11)
WBC: 6.3 10*3/uL (ref 4.0–10.5)

## 2013-03-03 LAB — BASIC METABOLIC PANEL
CO2: 32 mEq/L (ref 19–32)
Chloride: 97 mEq/L (ref 96–112)
Potassium: 4.3 mEq/L (ref 3.5–5.3)
Sodium: 137 mEq/L (ref 135–145)

## 2013-03-03 NOTE — Patient Instructions (Signed)
Please have blood work done today.  You have been referred to Dr. Page Spiro (a gastroenterologist) in [place].  We need you to see him soon.   Your physician recommends that you schedule a follow-up appointment in: 1 month.

## 2013-03-08 ENCOUNTER — Telehealth (INDEPENDENT_AMBULATORY_CARE_PROVIDER_SITE_OTHER): Payer: Self-pay | Admitting: *Deleted

## 2013-03-08 ENCOUNTER — Other Ambulatory Visit (INDEPENDENT_AMBULATORY_CARE_PROVIDER_SITE_OTHER): Payer: Self-pay | Admitting: *Deleted

## 2013-03-08 ENCOUNTER — Encounter (INDEPENDENT_AMBULATORY_CARE_PROVIDER_SITE_OTHER): Payer: Self-pay | Admitting: Internal Medicine

## 2013-03-08 ENCOUNTER — Ambulatory Visit (INDEPENDENT_AMBULATORY_CARE_PROVIDER_SITE_OTHER): Payer: Medicare Other | Admitting: Internal Medicine

## 2013-03-08 VITALS — BP 116/72 | HR 60 | Temp 97.3°F | Ht 61.0 in | Wt 206.2 lb

## 2013-03-08 DIAGNOSIS — R195 Other fecal abnormalities: Secondary | ICD-10-CM

## 2013-03-08 DIAGNOSIS — Z1211 Encounter for screening for malignant neoplasm of colon: Secondary | ICD-10-CM

## 2013-03-08 NOTE — Progress Notes (Addendum)
Subjective:     Patient ID: Lisa Saunders, female   DOB: 07-Jun-1932, 79 y.o.   MRN: [id number]  HPI Presents today with c/o rectal bleeding. Recently placed on Coumadin 4 weeks. She had rectal bleeding 2 days after starting the coumadin.  She was on the Coumadin x 2 days. She was taking the Coumadin for possible DVT  The report was negative for rt lower extremity embolus. Her last colonoscopy attempt in 2001 and was incomplete. Examination limited to spenic flexure. Small hemorrhoid, otherwise normal exam. Sigmoid colon relatively fixed and tortuous resulting in incompete exam.  Barium enema with air: No evidence for abnormality of the colon.   Appetite is good. No weight loss.  No abdominal pain. Stools x 1 a day usually. If she doesn't have one she will take Lactulose prn.  No melena or bright red rectal bleeding recently since stopping the Coumadin.  Hx of paroxysmal atrial fib per records  Married with 2 children. CBC     Component Value Date/Time   WBC 6.3 03/03/2013 1646   RBC 3.83* 03/03/2013 1646   RBC 4.10 03/01/2007 1450   HGB 11.3* 03/03/2013 1646   HCT 34.5* 03/03/2013 1646   PLT 385 03/03/2013 1646   MCV 90.1 03/03/2013 1646   MCH 29.5 03/03/2013 1646   MCHC 32.8 03/03/2013 1646   RDW 13.5 03/03/2013 1646   LYMPHSABS 1.5 07/19/2011 0833   MONOABS 0.6 07/19/2011 0833   EOSABS 0.1 07/19/2011 0833   BASOSABS 0.0 07/19/2011 1191       Review of Systems see hpi  Current Outpatient Prescriptions  Medication Sig Dispense Refill  . ADVAIR DISKUS 250-50 MCG/DOSE AEPB Inhale 1 puff into the lungs 2 (two) times daily.       Marland Kitchen albuterol (PROVENTIL HFA;VENTOLIN HFA) 108 (90 BASE) MCG/ACT inhaler Inhale 2 puffs into the lungs every 6 (six) hours as needed. For shortness of breath      . ALPRAZolam (XANAX) 1 MG tablet Take 1 mg by mouth 4 (four) times daily as needed. For anxiety      . calcitonin, salmon, (MIACALCIN/FORTICAL) 200 UNIT/ACT nasal spray Place 1 spray into the nose  daily.      . Calcium Carbonate-Vitamin D (CALTRATE 600+D PO) Take by mouth.      . cholecalciferol (VITAMIN D) 1000 UNITS tablet Take 1,000 Units by mouth daily.      . ferrous sulfate 325 (65 FE) MG tablet Take 325 mg by mouth 2 (two) times daily after a meal.       . FLUoxetine (PROZAC) 20 MG tablet Take 20 mg by mouth daily.      . furosemide (LASIX) 40 MG tablet Take 40 mg by mouth 2 (two) times daily.       . Garlic 1000 MG CAPS Take 1 capsule by mouth daily.      Marland Kitchen KLOR-CON M20 20 MEQ tablet Take 20 mEq by mouth 2 (two) times daily.       Marland Kitchen lactulose (CHRONULAC) 10 GM/15ML solution Take 20 g by mouth at bedtime.      . metoprolol (LOPRESSOR) 50 MG tablet Take 50 mg by mouth 2 (two) times daily.      Marland Kitchen NASONEX 50 MCG/ACT nasal spray Place 2 sprays into the nose daily as needed.      Marland Kitchen OVER THE COUNTER MEDICATION 2 (two) times daily. Nasocort      . oxyCODONE-acetaminophen (PERCOCET) 5-325 MG per tablet Take 1 tablet by mouth every 6 (six)  hours as needed for pain. For pain      . risperiDONE (RISPERDAL) 1 MG tablet Take 1 mg by mouth at bedtime.      Marland Kitchen tiZANidine (ZANAFLEX) 2 MG tablet Take 2 mg by mouth every 8 (eight) hours as needed.      . vitamin B-12 (CYANOCOBALAMIN) 1000 MCG tablet Take 1,000 mcg by mouth daily.      Marland Kitchen zolpidem (AMBIEN) 10 MG tablet Take 10 mg by mouth at bedtime as needed.       No current facility-administered medications for this visit.     Past Medical History  Diagnosis Date  . Back pain   . Hypertension   . Complication of anesthesia     pt states"I was in a coma for 5 days after my 3rd back surgery and no one knows why". husband states she hasnt been right since then,doesnt walk right or anything" Dr Bettina Gavia was MD  . Fibromyalgia   . COPD (chronic obstructive pulmonary disease)          Objective:   Physical Exam  Filed Vitals:   03/08/13 1551  BP: 116/72  Pulse: 60  Temp: 97.3 F (36.3 C)  Height: 5\' 1"  (1.549 m)  Weight: 206 lb 3.2  oz (93.532 kg)  Alert and oriented. Skin warm and dry. Oral mucosa is moist.   . Sclera anicteric, conjunctivae is pink. Thyroid not enlarged. No cervical lymphadenopathy. Lungs clear. Heart regular rate and rhythm.  Abdomen is soft. Bowel sounds are positive. No hepatomegaly. No abdominal masses felt. No tenderness.  2-3+ edema to lower extremities. Stool brown and guaiac positive.  Walks with a walker.      Assessment:   Heme positive stool. Colonic neoplasm needs to be ruled. Has been off Coumadin for about 3-4 weeks.    Plan:    Colonoscopy with Dr. Karilyn Cota with pediatric scope. Will use Propofol for sedation.

## 2013-03-08 NOTE — Patient Instructions (Addendum)
Colonoscopy with Dr. Rehman. The risks and benefits such as perforation, bleeding, and infection were reviewed with the patient and is agreeable. 

## 2013-03-08 NOTE — Telephone Encounter (Signed)
Patient needs trilyte 

## 2013-03-09 ENCOUNTER — Encounter: Payer: Self-pay | Admitting: Internal Medicine

## 2013-03-09 DIAGNOSIS — I480 Paroxysmal atrial fibrillation: Secondary | ICD-10-CM | POA: Insufficient documentation

## 2013-03-09 MED ORDER — PEG 3350-KCL-NA BICARB-NACL 420 G PO SOLR: 4000.0000 mL | Freq: Once | ORAL | Status: DC

## 2013-03-09 NOTE — Progress Notes (Signed)
OFFICE NOTE  Chief Complaint:  Palpitations, a-fib  Primary Care Physician: Cassell Smiles., MD  HPI:  Lisa Saunders is a pleasant 79 year old female seen in the distant past by Dr. Domingo Sep in 2004. She was referred to our practice for evaluation of new-onset atrial fibrillation. 6 Lisa Saunders she was in the office seeing her primary care provider and her heart rate was noted to be fast. She had not really complained of palpitations but an EKG demonstrated paroxysmal atrial fibrillation. It was recommended that she started on warfarin. Since starting on warfarin, she's had several episodes of diarrhea of which blood was noted. She then discontinued her warfarin. She reports occasionally she has palpitations but has not noted a fast heart rate that was seen in the office. I reviewed an EKG from Dr. Mercy Riding office which does clearly show atrial fibrillation with rapid ventricular response at a rate of about 120. He denies any chest pain or shortness of breath with this.  PMHx:  Past Medical History  Diagnosis Date  . Back pain   . Hypertension   . Complication of anesthesia     pt states"I was in a coma for 5 days after my 3rd back surgery and no one knows why". husband states she hasnt been right since then,doesnt walk right or anything" Dr Bettina Gavia was MD  . Fibromyalgia   . COPD (chronic obstructive pulmonary disease)     Past Surgical History  Procedure Laterality Date  . Spinal cord stimulator implant    . Abdominal hysterectomy    . Appendectomy    . Cholecystectomy    . Knee surgery    . Back surgery x 4    . Cataract extraction Right   . Cataract extraction w/phaco Left 07/19/2012    Procedure: CATARACT EXTRACTION PHACO AND INTRAOCULAR LENS PLACEMENT (IOC);  Surgeon: Susa Simmonds, MD;  Location: [facility];  Service: Ophthalmology;  Laterality: Left;  CDE 12.60  . Cardiac catheterization  11/2002    w/o signficant  (Dr. Laurell Josephs)  . Transthoracic  echocardiogram  11/16/2012    EF 55-60%, grade 2 diastolic dysfunction; LA severely dilated; mod TR;     FAMHx:  Family History  Problem Relation Age of Onset  . Heart failure Mother   . Heart attack Father     SOCHx:   reports that she quit smoking about 48 years ago. She has never used smokeless tobacco. She reports that she does not drink alcohol or use illicit drugs.  ALLERGIES:  Allergies  Allergen Reactions  . Latex   . Morphine   . Prednisone     Makes me crazy and mean  . Procardia [Nifedipine] Other (See Comments)    unknown    ROS: A comprehensive review of systems was negative except for: Cardiovascular: positive for irregular heart beat and palpitations  HOME MEDS: Current Outpatient Prescriptions  Medication Sig Dispense Refill  . ADVAIR DISKUS 250-50 MCG/DOSE AEPB Inhale 1 puff into the lungs 2 (two) times daily.       Marland Kitchen albuterol (PROVENTIL HFA;VENTOLIN HFA) 108 (90 BASE) MCG/ACT inhaler Inhale 2 puffs into the lungs every 6 (six) hours as needed. For shortness of breath      . ALPRAZolam (XANAX) 1 MG tablet Take 1 mg by mouth 4 (four) times daily as needed. For anxiety      . calcitonin, salmon, (MIACALCIN/FORTICAL) 200 UNIT/ACT nasal spray Place 1 spray into the nose daily.      . Calcium  Carbonate-Vitamin D (CALTRATE 600+D PO) Take by mouth.      . cholecalciferol (VITAMIN D) 1000 UNITS tablet Take 1,000 Units by mouth daily.      . ferrous sulfate 325 (65 FE) MG tablet Take 325 mg by mouth 2 (two) times daily after a meal.       . FLUoxetine (PROZAC) 20 MG tablet Take 20 mg by mouth daily.      . furosemide (LASIX) 40 MG tablet Take 40 mg by mouth 2 (two) times daily.       . Garlic 1000 MG CAPS Take 1 capsule by mouth daily.      Marland Kitchen KLOR-CON M20 20 MEQ tablet Take 20 mEq by mouth 2 (two) times daily.       Marland Kitchen lactulose (CHRONULAC) 10 GM/15ML solution Take 20 g by mouth at bedtime.      . metoprolol (LOPRESSOR) 50 MG tablet Take 50 mg by mouth 2 (two) times  daily.      Marland Kitchen NASONEX 50 MCG/ACT nasal spray Place 2 sprays into the nose daily as needed.      Marland Kitchen OVER THE COUNTER MEDICATION 2 (two) times daily. Nasocort      . oxyCODONE-acetaminophen (PERCOCET) 5-325 MG per tablet Take 1 tablet by mouth every 6 (six) hours as needed for pain. For pain      . risperiDONE (RISPERDAL) 1 MG tablet Take 1 mg by mouth at bedtime.      Marland Kitchen tiZANidine (ZANAFLEX) 2 MG tablet Take 2 mg by mouth every 8 (eight) hours as needed.      . vitamin B-12 (CYANOCOBALAMIN) 1000 MCG tablet Take 1,000 mcg by mouth daily.      Marland Kitchen zolpidem (AMBIEN) 10 MG tablet Take 10 mg by mouth at bedtime as needed.      Marland Kitchen aspirin 81 MG tablet Take 81 mg by mouth daily.       No current facility-administered medications for this visit.    LABS/IMAGING: No results found for this or any previous visit (from the past 48 hour(s)). No results found.  VITALS: BP 132/84  Pulse 66  Ht 5\' 1"  (1.549 m)  Wt 197 lb 4.8 oz (89.495 kg)  BMI 37.30 kg/m2  EXAM: General appearance: alert and no distress, moderately obese Neck: no carotid bruit and no JVD Lungs: clear to auscultation bilaterally Heart: regular rate and rhythm, S1, S2 normal, no murmur, click, rub or gallop Abdomen: soft, non-tender; bowel sounds normal; no masses,  no organomegaly Extremities: extremities normal, atraumatic, no cyanosis or edema Pulses: 2+ and symmetric Skin: Skin color, texture, turgor normal. No rashes or lesions Neurologic: Grossly normal Psych: Mood, affect normal  EKG: Normal sinus rhythm at 66  ASSESSMENT: 1. Paroxysmal atrial fibrillation 2. Apparent lower GI bleeding on warfarin 3. Hypertension-controlled  PLAN: 1.   Lisa Saunders has new onset paroxysmal atrial fibrillation. While I agree that she needs anticoagulation, I was concerned to hear that she had diarrhea with blood after starting Coumadin. She did also note nausea and some vomiting therefore this could be perhaps an entero-hemorrhagic  infectious diarrhea.  Nevertheless, I recommended her staying off of warfarin until she could have a GI evaluation. She seen Dr. Dionicia Abler in the past and I recommended that she followup as soon as possible with their office for a colonoscopy. If this is negative and based on his recommendations, we may then recommend starting anticoagulation.  Chrystie Nose, MD, [facility] Attending Cardiologist [facility]  Tristin Vandeusen C 03/09/2013, 8:44  AM

## 2013-03-14 ENCOUNTER — Encounter (HOSPITAL_COMMUNITY): Payer: Self-pay

## 2013-03-14 ENCOUNTER — Encounter (HOSPITAL_COMMUNITY): Payer: Self-pay | Admitting: Pharmacy Technician

## 2013-03-15 ENCOUNTER — Encounter (HOSPITAL_COMMUNITY)
Admission: RE | Admit: 2013-03-15 | Discharge: 2013-03-15 | Disposition: A | Discharge status: Home / Self Care | Payer: Medicare Other | Source: Ambulatory Visit | Attending: Internal Medicine | Admitting: Internal Medicine

## 2013-03-15 HISTORY — DX: Anemia, unspecified: D64.9

## 2013-03-15 HISTORY — DX: Anxiety disorder, unspecified: F41.9

## 2013-03-21 ENCOUNTER — Ambulatory Visit (HOSPITAL_COMMUNITY): Payer: Medicare Other | Admitting: Anesthesiology

## 2013-03-21 ENCOUNTER — Encounter (HOSPITAL_COMMUNITY): Payer: Medicare Other | Admitting: Anesthesiology

## 2013-03-21 ENCOUNTER — Encounter (HOSPITAL_COMMUNITY)
Admission: RE | Disposition: A | Discharge status: Home / Self Care | Payer: Self-pay | Source: Ambulatory Visit | Attending: Internal Medicine

## 2013-03-21 ENCOUNTER — Ambulatory Visit (HOSPITAL_COMMUNITY)
Admission: RE | Admit: 2013-03-21 | Discharge: 2013-03-21 | Disposition: A | Discharge status: Home / Self Care | Payer: Medicare Other | Source: Ambulatory Visit | Attending: Internal Medicine | Admitting: Internal Medicine

## 2013-03-21 ENCOUNTER — Encounter (HOSPITAL_COMMUNITY): Payer: Self-pay | Admitting: *Deleted

## 2013-03-21 DIAGNOSIS — K625 Hemorrhage of anus and rectum: Secondary | ICD-10-CM | POA: Insufficient documentation

## 2013-03-21 DIAGNOSIS — J449 Chronic obstructive pulmonary disease, unspecified: Secondary | ICD-10-CM | POA: Insufficient documentation

## 2013-03-21 DIAGNOSIS — K921 Melena: Secondary | ICD-10-CM | POA: Insufficient documentation

## 2013-03-21 DIAGNOSIS — D126 Benign neoplasm of colon, unspecified: Secondary | ICD-10-CM | POA: Insufficient documentation

## 2013-03-21 DIAGNOSIS — J4489 Other specified chronic obstructive pulmonary disease: Secondary | ICD-10-CM | POA: Insufficient documentation

## 2013-03-21 DIAGNOSIS — R195 Other fecal abnormalities: Secondary | ICD-10-CM

## 2013-03-21 DIAGNOSIS — K644 Residual hemorrhoidal skin tags: Secondary | ICD-10-CM

## 2013-03-21 DIAGNOSIS — Z7982 Long term (current) use of aspirin: Secondary | ICD-10-CM | POA: Insufficient documentation

## 2013-03-21 DIAGNOSIS — I10 Essential (primary) hypertension: Secondary | ICD-10-CM | POA: Insufficient documentation

## 2013-03-21 HISTORY — PX: COLONOSCOPY WITH PROPOFOL: SHX5780

## 2013-03-21 HISTORY — PX: POLYPECTOMY: SHX5525

## 2013-03-21 SURGERY — COLONOSCOPY WITH PROPOFOL
Anesthesia: Monitor Anesthesia Care | Site: Rectum

## 2013-03-21 MED ORDER — GLYCOPYRROLATE 0.2 MG/ML IJ SOLN
INTRAMUSCULAR | Status: AC
  Filled 2013-03-21: qty 1

## 2013-03-21 MED ORDER — LIDOCAINE HCL (PF) 1 % IJ SOLN
INTRAMUSCULAR | Status: AC
  Filled 2013-03-21: qty 5

## 2013-03-21 MED ORDER — PROPOFOL 10 MG/ML IV EMUL
INTRAVENOUS | Status: AC
  Filled 2013-03-21: qty 20

## 2013-03-21 MED ORDER — LACTATED RINGERS IV SOLN
INTRAVENOUS | Status: DC
  Administered 2013-03-21: 07:00:00 via INTRAVENOUS

## 2013-03-21 MED ORDER — FENTANYL CITRATE 0.05 MG/ML IJ SOLN
25.0000 ug | INTRAMUSCULAR | Status: AC
  Administered 2013-03-21 (×2): 25 ug via INTRAVENOUS

## 2013-03-21 MED ORDER — STERILE WATER FOR IRRIGATION IR SOLN
Status: DC | PRN
  Administered 2013-03-21: 08:00:00

## 2013-03-21 MED ORDER — PROPOFOL INFUSION 10 MG/ML OPTIME
INTRAVENOUS | Status: DC | PRN
  Administered 2013-03-21: 100 ug/kg/min via INTRAVENOUS

## 2013-03-21 MED ORDER — FENTANYL CITRATE 0.05 MG/ML IJ SOLN: 25.0000 ug | INTRAMUSCULAR | Status: DC | PRN

## 2013-03-21 MED ORDER — MIDAZOLAM HCL 2 MG/2ML IJ SOLN
INTRAMUSCULAR | Status: AC
  Filled 2013-03-21: qty 2

## 2013-03-21 MED ORDER — FENTANYL CITRATE 0.05 MG/ML IJ SOLN
INTRAMUSCULAR | Status: DC | PRN
  Administered 2013-03-21: 25 ug via INTRAVENOUS

## 2013-03-21 MED ORDER — GLYCOPYRROLATE 0.2 MG/ML IJ SOLN
0.2000 mg | Freq: Once | INTRAMUSCULAR | Status: AC
  Administered 2013-03-21: 0.2 mg via INTRAVENOUS

## 2013-03-21 MED ORDER — FENTANYL CITRATE 0.05 MG/ML IJ SOLN
INTRAMUSCULAR | Status: AC
  Filled 2013-03-21: qty 2

## 2013-03-21 MED ORDER — ONDANSETRON HCL 4 MG/2ML IJ SOLN: 4.0000 mg | Freq: Once | INTRAMUSCULAR | Status: DC | PRN

## 2013-03-21 MED ORDER — MIDAZOLAM HCL 2 MG/2ML IJ SOLN: 1.0000 mg | INTRAMUSCULAR | Status: DC | PRN

## 2013-03-21 SURGICAL SUPPLY — 25 items
ELECT REM PT RETURN 9FT ADLT (ELECTROSURGICAL)
ELECTRODE REM PT RTRN 9FT ADLT (ELECTROSURGICAL) IMPLANT
FCP BXJMBJMB 240X2.8X (CUTTING FORCEPS)
FLOOR PAD 36X40 (MISCELLANEOUS) ×2
FORCEPS BIOP RAD 4 LRG CAP 4 (CUTTING FORCEPS) ×1 IMPLANT
FORCEPS BIOP RJ4 240 W/NDL (CUTTING FORCEPS)
FORCEPS BXJMBJMB 240X2.8X (CUTTING FORCEPS) IMPLANT
FORMALIN 10 PREFIL 20ML (MISCELLANEOUS) ×1 IMPLANT
INJECTOR/SNARE I SNARE (MISCELLANEOUS) IMPLANT
KIT CLEAN ENDO COMPLIANCE (KITS) ×2 IMPLANT
LUBRICANT JELLY 4.5OZ STERILE (MISCELLANEOUS) ×1 IMPLANT
MANIFOLD NEPTUNE II (INSTRUMENTS) ×1 IMPLANT
NDL SCLEROTHERAPY 25GX240 (NEEDLE) IMPLANT
NEEDLE SCLEROTHERAPY 25GX240 (NEEDLE) ×2 IMPLANT
PAD FLOOR 36X40 (MISCELLANEOUS) IMPLANT
PROBE APC STR FIRE (PROBE) IMPLANT
PROBE INJECTION GOLD (MISCELLANEOUS)
PROBE INJECTION GOLD 7FR (MISCELLANEOUS) IMPLANT
SNARE ROTATE MED OVAL 20MM (MISCELLANEOUS) IMPLANT
SNARE SHORT THROW 13M SML OVAL (MISCELLANEOUS) ×2 IMPLANT
SYR 50ML LL SCALE MARK (SYRINGE) ×1 IMPLANT
TRAP SPECIMEN MUCOUS 40CC (MISCELLANEOUS) ×1 IMPLANT
TUBING ENDO SMARTCAP PENTAX (MISCELLANEOUS) ×1 IMPLANT
TUBING IRRIGATION ENDOGATOR (MISCELLANEOUS) ×1 IMPLANT
WATER STERILE IRR 1000ML POUR (IV SOLUTION) ×1 IMPLANT

## 2013-03-21 NOTE — Transfer of Care (Signed)
Immediate Anesthesia Transfer of Care Note  Patient: Lisa Saunders  Procedure(s) Performed: Procedure(s) with comments: COLONOSCOPY WITH PROPOFOL (N/A) - In cecum at 751, out at 0802 = 11 minutes total time POLYPECTOMY (N/A)  Patient Location: PACU  Anesthesia Type:MAC  Level of Consciousness: awake, alert  and oriented  Airway & Oxygen Therapy: Patient Spontanous Breathing  Post-op Assessment: Report given to PACU RN  Post vital signs: Reviewed  Complications: No apparent anesthesia complications

## 2013-03-21 NOTE — Op Note (Signed)
COLONOSCOPY PROCEDURE REPORT  PATIENT:  Lisa Saunders  MR#:  [id number] Birthdate:  07-10-1932, 79 y.o., female Endoscopist:  Dr. Malissa Hippo, MD Referred By:  Dr. Madelin Rear. Sherwood Gambler, MD  Procedure Date: 03/21/2013  Procedure:   Colonoscopy  Indications:  Patient is 79 year old Caucasian female who presents with history of rectal bleeding and heme-positive stools. She is undergoing diagnostic colonoscopy. Patient's last exam was over 10 years ago and was incomplete followed by barium enema.  Informed Consent:  The procedure and risks were reviewed with the patient and informed consent was obtained.  Medications:  Monitored anesthesia care. Please see anesthesia records for details.  Description of procedure:  After a digital rectal exam was performed, that colonoscope was advanced from the anus through the rectum and colon to the area of the cecum, ileocecal valve and appendiceal orifice. The cecum was deeply intubated. These structures were well-seen and photographed for the record. From the level of the cecum and ileocecal valve, the scope was slowly and cautiously withdrawn. The mucosal surfaces were carefully surveyed utilizing scope tip to flexion to facilitate fold flattening as needed. The scope was pulled down into the rectum where a thorough exam including retroflexion was performed.  Findings:   Prep excellent. Small polyps ablated via cold biopsy from hepatic flexure. Mucosa rest of the colon and rectum was normal. Small hemorrhoids below the dentate line along with anal papillae.   Therapeutic/Diagnostic Maneuvers Performed:  See above  Complications:  None  Cecal Withdrawal Time:  11 minutes  Impression:  Examination performed to cecum. Small polyps ablated via cold biopsy from hepatic flexure. External hemorrhoids and small anal papillae.  Recommendations:  Standard instructions given. I will contact patient with biopsy results and further  recommendations.  REHMAN,NAJEEB U  03/21/2013 8:14 AM  CC: Dr. Cassell Smiles., MD & Dr. Bonnetta Barry ref. provider found

## 2013-03-21 NOTE — Anesthesia Preprocedure Evaluation (Addendum)
Anesthesia Evaluation  Patient identified by MRN, date of birth, ID band Patient awake    Reviewed: Allergy & Precautions, H&P , NPO status , Patient's Chart, lab work & pertinent test results  History of Anesthesia Complications (+) PROLONGED EMERGENCE and history of anesthetic complications  Airway Mallampati: III TM Distance: <3 FB Neck ROM: Full    Dental  (+) Teeth Intact, Implants and Dental Advisory Given   Pulmonary asthma (nebs today) , COPD COPD inhaler, former smoker,  breath sounds clear to auscultation        Cardiovascular hypertension, Rhythm:Regular Rate:Normal     Neuro/Psych PSYCHIATRIC DISORDERS Anxiety    GI/Hepatic   Endo/Other    Renal/GU      Musculoskeletal  (+) Fibromyalgia -  Abdominal   Peds  Hematology  (+) anemia ,   Anesthesia Other Findings   Reproductive/Obstetrics                          Anesthesia Physical Anesthesia Plan  ASA: III  Anesthesia Plan: MAC   Post-op Pain Management:    Induction: Intravenous  Airway Management Planned: Simple Face Mask  Additional Equipment:   Intra-op Plan:   Post-operative Plan:   Informed Consent: I have reviewed the patients History and Physical, chart, labs and discussed the procedure including the risks, benefits and alternatives for the proposed anesthesia with the patient or authorized representative who has indicated his/her understanding and acceptance.     Plan Discussed with:   Anesthesia Plan Comments:         Anesthesia Quick Evaluation

## 2013-03-21 NOTE — H&P (Signed)
Lisa Saunders is an 79 y.o. female.   Chief Complaint: Patient is here for colonoscopy. HPI: Patient is 79 year old Caucasian female who had an episode of rectal bleeding about 2 months ago after she was begun on Coumadin which since has been discontinued. She was recent seen in the office and noted to have heme-positive stool. She denies abdominal pain anorexia weight loss. She has in her bowel movements. She has constipation and/or diarrhea. Patient's last colonoscopy was over 10 years ago. He was incomplete and followed by barium enema. Patient will undergo colonoscopy with monitored anesthesia care since she is on multiple medications and would be hard to sedate for conscious sedation. Family history is negative for CRC.  Past Medical History  Diagnosis Date  . Back pain   . Hypertension   . Complication of anesthesia     pt states"I was in a coma for 5 days after my 3rd back surgery and no one knows why". husband states she hasnt been right since then,doesnt walk right or anything" Dr Bettina Gavia was MD  . Fibromyalgia   . COPD (chronic obstructive pulmonary disease)   . Anxiety   . Anemia     Past Surgical History  Procedure Laterality Date  . Spinal cord stimulator implant    . Abdominal hysterectomy    . Appendectomy    . Cholecystectomy    . Knee surgery    . Back surgery x 4    . Cataract extraction Right   . Cataract extraction w/phaco Left 07/19/2012    Procedure: CATARACT EXTRACTION PHACO AND INTRAOCULAR LENS PLACEMENT (IOC);  Surgeon: Susa Simmonds, MD;  Location: [facility];  Service: Ophthalmology;  Laterality: Left;  CDE 12.60  . Cardiac catheterization  11/2002    w/o signficant  (Dr. Laurell Josephs)  . Transthoracic echocardiogram  11/16/2012    EF 55-60%, grade 2 diastolic dysfunction; LA severely dilated; mod TR;     Family History  Problem Relation Age of Onset  . Heart failure Mother   . Heart attack Father    Social History:  reports that she quit smoking  about 48 years ago. She has never used smokeless tobacco. She reports that she does not drink alcohol or use illicit drugs.  Allergies:  Allergies  Allergen Reactions  . Latex   . Morphine   . Prednisone     Makes me crazy and mean  . Procardia [Nifedipine] Other (See Comments)    unknown    Medications Prior to Admission  Medication Sig Dispense Refill  . ADVAIR DISKUS 250-50 MCG/DOSE AEPB Inhale 1 puff into the lungs 2 (two) times daily.       Marland Kitchen albuterol (PROVENTIL HFA;VENTOLIN HFA) 108 (90 BASE) MCG/ACT inhaler Inhale 2 puffs into the lungs every 6 (six) hours as needed. For shortness of breath      . ALPRAZolam (XANAX) 1 MG tablet Take 1 mg by mouth 4 (four) times daily as needed. For anxiety      . aspirin 81 MG tablet Take 81 mg by mouth daily.      . budesonide-formoterol (SYMBICORT) 80-4.5 MCG/ACT inhaler Inhale 2 puffs into the lungs 2 (two) times daily.      . calcitonin, salmon, (MIACALCIN/FORTICAL) 200 UNIT/ACT nasal spray Place 1 spray into the nose daily.      . Calcium Carbonate-Vitamin D (CALTRATE 600+D PO) Take by mouth.      . cholecalciferol (VITAMIN D) 1000 UNITS tablet Take 1,000 Units by mouth daily.      Marland Kitchen  ferrous sulfate 325 (65 FE) MG tablet Take 325 mg by mouth 2 (two) times daily after a meal.       . FLUoxetine (PROZAC) 20 MG tablet Take 20 mg by mouth daily.      . furosemide (LASIX) 40 MG tablet Take 40 mg by mouth 2 (two) times daily.       . Garlic 1000 MG CAPS Take 1 capsule by mouth daily.      Marland Kitchen KLOR-CON M20 20 MEQ tablet Take 20 mEq by mouth 2 (two) times daily.       Marland Kitchen lactulose (CHRONULAC) 10 GM/15ML solution Take 20 g by mouth at bedtime.      . metoprolol (LOPRESSOR) 50 MG tablet Take 50 mg by mouth 2 (two) times daily.      Marland Kitchen NASONEX 50 MCG/ACT nasal spray Place 2 sprays into the nose daily as needed.      Marland Kitchen OVER THE COUNTER MEDICATION 2 (two) times daily. Nasocort      . oxyCODONE-acetaminophen (PERCOCET) 5-325 MG per tablet Take 1 tablet by  mouth every 6 (six) hours as needed for pain. For pain      . risperiDONE (RISPERDAL) 1 MG tablet Take 1 mg by mouth at bedtime.      Marland Kitchen tiZANidine (ZANAFLEX) 2 MG tablet Take 2 mg by mouth every 8 (eight) hours as needed.      . vitamin B-12 (CYANOCOBALAMIN) 1000 MCG tablet Take 1,000 mcg by mouth daily.      Marland Kitchen zolpidem (AMBIEN) 10 MG tablet Take 10 mg by mouth at bedtime as needed.        No results found for this or any previous visit (from the past 48 hour(s)). No results found.  ROS  Blood pressure 176/82, pulse 62, resp. rate 18, height 5\' 1"  (1.549 m), weight 206 lb (93.441 kg), SpO2 97.00%. Physical Exam  Constitutional: She appears well-developed and well-nourished.  HENT:  Oropharyngeal mucosa is dry otherwise normal  Eyes: Conjunctivae are normal. No scleral icterus.  Neck: No thyromegaly present.  Cardiovascular: Normal rate, regular rhythm and normal heart sounds.   No murmur heard. Respiratory: Effort normal and breath sounds normal.  GI: Soft. She exhibits no distension and no mass. There is no tenderness.  Musculoskeletal: Edema: 2+ pitting edema involving both legs with erythema to skin.  Lymphadenopathy:    She has no cervical adenopathy.  Skin: Skin is warm and dry.     Assessment/Plan Heme-positive stool. History of rectal bleeding. Diagnostic colonoscopy.  Lodema Parma U 03/21/2013, 7:25 AM

## 2013-03-21 NOTE — Anesthesia Postprocedure Evaluation (Signed)
  Anesthesia Post-op Note  Patient: Lisa Saunders  Procedure(s) Performed: Procedure(s) with comments: COLONOSCOPY WITH PROPOFOL (N/A) - In cecum at 751, out at 0802 = 11 minutes total time POLYPECTOMY (N/A)  Patient Location: PACU  Anesthesia Type:General  Level of Consciousness: awake, alert  and oriented  Airway and Oxygen Therapy: Patient Spontanous Breathing  Post-op Pain: none  Post-op Assessment: Post-op Vital signs reviewed, Patient's Cardiovascular Status Stable, Respiratory Function Stable, Patent Airway and No signs of Nausea or vomiting  Post-op Vital Signs: Reviewed and stable  Complications: No apparent anesthesia complications

## 2013-03-23 ENCOUNTER — Encounter (HOSPITAL_COMMUNITY): Payer: Self-pay | Admitting: Internal Medicine

## 2013-03-28 ENCOUNTER — Encounter (INDEPENDENT_AMBULATORY_CARE_PROVIDER_SITE_OTHER): Payer: Self-pay | Admitting: *Deleted

## 2013-04-04 ENCOUNTER — Ambulatory Visit: Payer: Medicare Other | Admitting: Internal Medicine

## 2013-04-11 ENCOUNTER — Encounter (INDEPENDENT_AMBULATORY_CARE_PROVIDER_SITE_OTHER): Payer: Self-pay | Admitting: *Deleted

## 2014-01-02 ENCOUNTER — Ambulatory Visit (HOSPITAL_COMMUNITY): Admission: RE | Admit: 2014-01-02 | Payer: Medicare Other | Source: Ambulatory Visit

## 2014-01-02 ENCOUNTER — Telehealth (HOSPITAL_COMMUNITY): Payer: Self-pay

## 2014-01-02 NOTE — Telephone Encounter (Signed)
Husband called to cancel appointment.  Said that the insurance would not pay for a wheelchair evaluation.

## 2014-03-09 ENCOUNTER — Encounter: Payer: Self-pay | Admitting: Neurology

## 2014-03-09 ENCOUNTER — Encounter (INDEPENDENT_AMBULATORY_CARE_PROVIDER_SITE_OTHER): Payer: Self-pay | Admitting: Neurology

## 2014-03-09 DIAGNOSIS — Z0289 Encounter for other administrative examinations: Secondary | ICD-10-CM

## 2014-03-09 NOTE — Progress Notes (Deleted)
Subjective:    Patient ID: Lisa Saunders is a 80 y.o. female.  HPI {Common ambulatory SmartLinks:[id number]}  Review of Systems  HENT: Positive for trouble swallowing.   Respiratory: Positive for wheezing.        Snoring  Cardiovascular: Positive for leg swelling.  Endocrine:       Increased thirst  Genitourinary:       Incontinience  Musculoskeletal: Positive for joint swelling.       Joint pain, cramps  Allergic/Immunologic:       Allergies  Neurological: Positive for headaches.       Insomnia, difficulty swallowing  Hematological: Bruises/bleeds easily.  Psychiatric/Behavioral:       Depression, anxiety,, too much sleep, not enough sleep, decreased energy, disinterest in activities    Objective:  Neurologic Exam  Physical Exam  Assessment:   ***  Plan:   ***

## 2014-03-09 NOTE — Progress Notes (Signed)
This encounter was created in error - please disregard.

## 2014-03-13 ENCOUNTER — Ambulatory Visit (INDEPENDENT_AMBULATORY_CARE_PROVIDER_SITE_OTHER): Payer: Medicare Other | Admitting: Internal Medicine

## 2014-03-27 ENCOUNTER — Encounter: Payer: Self-pay | Admitting: Neurology

## 2014-03-27 ENCOUNTER — Ambulatory Visit (INDEPENDENT_AMBULATORY_CARE_PROVIDER_SITE_OTHER): Payer: Commercial Managed Care - HMO | Admitting: Neurology

## 2014-03-27 VITALS — BP 152/76 | HR 70 | Temp 97.6°F | Ht 59.0 in | Wt 170.0 lb

## 2014-03-27 DIAGNOSIS — G8929 Other chronic pain: Secondary | ICD-10-CM

## 2014-03-27 DIAGNOSIS — G4733 Obstructive sleep apnea (adult) (pediatric): Secondary | ICD-10-CM

## 2014-03-27 DIAGNOSIS — M549 Dorsalgia, unspecified: Secondary | ICD-10-CM

## 2014-03-27 NOTE — Patient Instructions (Signed)

## 2014-03-27 NOTE — Progress Notes (Signed)
Subjective:    Patient ID: Lisa Saunders is a 80 y.o. female.  HPI     Star Age, MD, PhD [facility] [address] [place], [place] [address]  Dear Dr. Gerarda Fraction,  I saw your patient, [name], upon your kind request in my neurologic clinic today for initial consultation of her sleep disorder, in particular, concern for underlying obstructive sleep apnea. The patient is accompanied by her husband today. As you know, [name] is a 80 year old right-handed woman with an underlying medical history of anxiety, reflux disease, chronic pain, hypertension, back pain (s/p 4 back surgeries), fibromyalgia syndrome, COPD, and obesity, who reports snoring and daytime somnolence. She has trouble going to sleep. She denies restless leg symptoms but has leg cramps. She has significant low back pain and is not comfortable at night. She wakes up with a headache and has sinus issues at times. She has to get up to use the bathroom usually in the early morning hours at least once per night. Of note, she is on Risperdal, 1 mg each night, Ambien, 5-10 mg each night, Percocet 10-3 25 mg, 2 capsules every 4 hours as needed (she takes a total of 6 pills currently), Xanax, 1 mg 4 times a day as needed (usually takes a total of 3 pills a day), was on trazodone 50 mg at bedtime (stopped a month ago), fluoxetine 20 mg each morning. She is in bed between 10 and 11 PM and it takes her up to 1 to up to 2 hours to fall asleep. She takes a 1/2 ambien at night, but sometimes takes a 2nd half later at night, sometimes as late as 6:30 AM. She stopped coumadin about a year ago. She had seen Dr. Debara Pickett in 11/14. Her sister was diagnosed with sleep apnea and has a CPAP machine. Her husband notes that she snores and he has witnessed pauses in her breathing. She does not wake up rested.  Her Past Medical History Is Significant For: Past Medical History  Diagnosis Date  . Back  pain   . Hypertension   . Complication of anesthesia     pt states"I was in a coma for 5 days after my 3rd back surgery and no one knows why". husband states she hasnt been right since then,doesnt walk right or anything" Dr Durene Cal was MD  . Fibromyalgia   . COPD (chronic obstructive pulmonary disease)   . Anxiety   . Anemia   . Depression     Her Past Surgical History Is Significant For: Past Surgical History  Procedure Laterality Date  . Spinal cord stimulator implant    . Abdominal hysterectomy    . Appendectomy    . Cholecystectomy    . Knee surgery    . Back surgery x 4    . Cataract extraction Right   . Cataract extraction w/phaco Left 07/19/2012    Procedure: CATARACT EXTRACTION PHACO AND INTRAOCULAR LENS PLACEMENT (IOC);  Surgeon: Williams Che, MD;  Location: [facility];  Service: Ophthalmology;  Laterality: Left;  CDE 12.60  . Cardiac catheterization  11/2002    w/o signficant  (Dr. Gerrie Nordmann)  . Transthoracic echocardiogram  11/16/2012    EF 47-82%, grade 2 diastolic dysfunction; LA severely dilated; mod TR;   . Colonoscopy with propofol N/A 03/21/2013    Procedure: COLONOSCOPY WITH PROPOFOL;  Surgeon: Rogene Houston, MD;  Location: [facility];  Service: Endoscopy;  Laterality: N/A;  In cecum at  751, out at 0802 = 11 minutes total time  . Polypectomy N/A 03/21/2013    Procedure: POLYPECTOMY;  Surgeon: Rogene Houston, MD;  Location: [facility];  Service: Endoscopy;  Laterality: N/A;  . Tonsillectomy      Her Family History Is Significant For: Family History  Problem Relation Age of Onset  . Heart failure Mother   . Heart attack Father     Her Social History Is Significant For: History   Social History  . Marital Status: Married    Spouse Name: Marcello Moores    Number of Children: 2  . Years of Education: college   Occupational History  .      retired   Social History Main Topics  . Smoking status: Former Smoker    Quit date: 04/21/1964  . Smokeless tobacco: Never  Used  . Alcohol Use: No  . Drug Use: No  . Sexual Activity: Yes    Birth Control/ Protection: Surgical   Other Topics Concern  . None   Social History Narrative   Patient consumes no caffeine    Her Allergies Are:  Allergies  Allergen Reactions  . Morphine     COMA  . Prednisone     Makes me crazy and mean  . Procardia [Nifedipine] Other (See Comments)    unknown  . Latex Rash  :   Her Current Medications Are:  Outpatient Encounter Prescriptions as of 03/27/2014  Medication Sig  . ADVAIR DISKUS 250-50 MCG/DOSE AEPB Inhale 1 puff into the lungs 2 (two) times daily.   Marland Kitchen albuterol (PROVENTIL HFA;VENTOLIN HFA) 108 (90 BASE) MCG/ACT inhaler Inhale 2 puffs into the lungs every 6 (six) hours as needed. For shortness of breath  . ALPRAZolam (XANAX) 1 MG tablet Take 1 mg by mouth 4 (four) times daily as needed. For anxiety  . Applicators (COTTON SWABS) SWAB   . aspirin 81 MG tablet Take 81 mg by mouth daily.  . budesonide-formoterol (SYMBICORT) 80-4.5 MCG/ACT inhaler Inhale 2 puffs into the lungs 2 (two) times daily.  . calcitonin, salmon, (MIACALCIN/FORTICAL) 200 UNIT/ACT nasal spray Place 1 spray into the nose daily.  . Calcium Carbonate-Vitamin D (CALTRATE 600+D PO) Take by mouth.  . cholecalciferol (VITAMIN D) 1000 UNITS tablet Take 1,000 Units by mouth daily.  Mariane Baumgarten Calcium (STOOL SOFTENER PO) As needed  . ferrous sulfate 325 (65 FE) MG tablet Take 325 mg by mouth 2 (two) times daily after a meal.   . FLUoxetine (PROZAC) 20 MG tablet Take 20 mg by mouth daily.  . furosemide (LASIX) 40 MG tablet Take 40 mg by mouth 2 (two) times daily.   . Garlic 4098 MG CAPS Take 1 capsule by mouth daily.  . Incontinence Supply Disposable (BLADDER CONTROL PADS EX ABSORB) MISC   . KLOR-CON M20 20 MEQ tablet Take 20 mEq by mouth 2 (two) times daily.   Marland Kitchen lactulose (CHRONULAC) 10 GM/15ML solution Take 20 g by mouth at bedtime.  . metoprolol (LOPRESSOR) 50 MG tablet Take 50 mg by mouth 2 (two)  times daily.  Marland Kitchen omeprazole (PRILOSEC) 40 MG capsule   . OVER THE COUNTER MEDICATION 2 (two) times daily. Nasocort  . oxybutynin (DITROPAN) 5 MG tablet Take 5 mg by mouth 2 (two) times daily. As needed  . oxyCODONE-acetaminophen (PERCOCET) 5-325 MG per tablet Take 1 tablet by mouth every 6 (six) hours as needed for pain. For pain  . risperiDONE (RISPERDAL) 1 MG tablet Take 1 mg by mouth at bedtime.  Marland Kitchen  tiZANidine (ZANAFLEX) 2 MG tablet Take 2 mg by mouth every 8 (eight) hours as needed.  . triamcinolone (NASACORT ALLERGY 24HR) 55 MCG/ACT AERO nasal inhaler Place 2 sprays into the nose daily.  . vitamin B-12 (CYANOCOBALAMIN) 1000 MCG tablet Take 1,000 mcg by mouth daily.  Marland Kitchen zolpidem (AMBIEN) 10 MG tablet Take 10 mg by mouth at bedtime as needed.  :  Review of Systems:  Out of a complete 14 point review of systems, all are reviewed and negative with the exception of these symptoms as listed below:   Review of Systems  HENT: Positive for trouble swallowing.   Respiratory: Positive for wheezing.   Cardiovascular: Positive for leg swelling.  Gastrointestinal:       Incontinence  Endocrine:       Increased thirst  Musculoskeletal:       Joint pain ,joint swelling, cramps  Allergic/Immunologic:       Allergies  Neurological: Positive for headaches.       Difficulty swallowing,insomnia, snoring  Hematological: Bruises/bleeds easily.  Psychiatric/Behavioral:       Depression, anxiety, too much sleep, not enough sleep, decreased  Energy, disinterest in activities    Objective:  Neurologic Exam  Physical Exam Physical Examination:   Filed Vitals:   03/27/14 1356  BP: 152/76  Pulse: 70  Temp: 97.6 F (36.4 C)    General Examination: The patient is a very pleasant 80 y.o. female in no acute distress. She appears well-developed and well-nourished and very well groomed.   HEENT: Normocephalic, atraumatic, pupils are equal, round and reactive to light and accommodation. Funduscopic  exam is normal with sharp disc margins noted. Extraocular tracking is good without limitation to gaze excursion or nystagmus noted. Normal smooth pursuit is noted. Hearing is grossly intact. Tympanic membranes are clear bilaterally. Face is symmetric with normal facial animation and normal facial sensation. Speech is clear with no dysarthria noted. There is no hypophonia. There is no lip, neck/head, jaw or voice tremor. Neck is supple with full range of passive and active motion. There are no carotid bruits on auscultation. Oropharynx exam reveals: moderate mouth dryness, adequate dental hygiene and mild airway crowding, due to narrow airway entry and redundant soft palate. Mallampati is class II. Tongue protrudes centrally and palate elevates symmetrically. Tonsils are absent.   Chest: Clear to auscultation without wheezing, rhonchi or crackles noted.  Heart: S1+S2+0, regular and normal without murmurs, rubs or gallops noted.   Abdomen: Soft, non-tender and non-distended with normal bowel sounds appreciated on auscultation.  Extremities: There is 1+ pitting edema in the distal lower extremities bilaterally. Pedal pulses are intact.  Skin: Warm and dry without trophic changes noted. There are no varicose veins.  Musculoskeletal: exam reveals  multiple joint deformitie in her hands and both knees, right more than left, and she also has an abnormal curvature in her back.   Neurologically:  Mental status: The patient is awake, alert and oriented in all 4 spheres. Her immediate and remote memory, attention, language skills and fund of knowledge are appropriate. There is no evidence of aphasia, agnosia, apraxia or anomia. Speech is clear with normal prosody and enunciation. Thought process is linear. Mood is normal and affect is normal.  On 03/09/14: MMSE: 28/30, CDT: 3/4, AFT: 10/min  Cranial nerves II - XII are as described above under HEENT exam. In addition: shoulder shrug is normal with equal  shoulder height noted. Motor exam: Normal bulk, strength and tone is noted. There is no drift, tremor or  rebound. Romberg is not tested today. Reflexes are 1+ in the upper extremities and trace in the lower extremities. Babinski: Toes are flexor bilaterally. Fine motor skills and coordination: intact with normal finger taps, normal hand movements, normal rapid alternating patting, normal foot taps and normal foot agility.  Cerebellar testing: No dysmetria or intention tremor on finger to nose testing. Heel to shin is not possible for her.  unremarkable bilaterally. There is no truncal or gait ataxia.  Sensory exam: intact to light touch, pinprick, vibration, temperature sense in the upper  extremities but decreased to vibration sense and temperature in the right distal lower extremity.   Gait, station and balance: she stands up with difficulty and has to push her self up. Her posture is abnormal. She has an abnormal curvature in her back. She walks with a limp and needs assistance. She brought her rolling walker which she maneuvers fine. She turns cautiously. Tandem walk is not tested due to balance issues and abnormal gait noted.  Assessment and Plan:    In summary, NATARA MONFORT is a very pleasant 80 y.o.-year old female with an underlying medical history of anxiety, reflux disease, chronic pain, hypertension, back pain (s/p 4 back surgeries), fibromyalgia syndrome, COPD, and obesity, who reports snoring, nocturia, nonrestorative sleep, and her husband has noted pauses in her breathing. Her history, family history and physical exam are concerning for underlying obstructive sleep apnea. In addition, she is on multiple sedating medications which may put her at risk for worsening OSA and/or central sleep apnea.  I had a long chat with the patient her husbandut my findings and the diagnosis of OSA, its prognosis and treatment options. We talked about medical treatments, surgical interventions and  non-pharmacological approaches. I explained in particular the risks and ramifications of untreated moderate to severe OSA, especially with respect to developing cardiovascular disease down the Road, including congestive heart failure, difficult to treat hypertension, cardiac arrhythmias, or stroke. Even type 2 diabetes has, in part, been linked to untreated OSA. Symptoms of untreated OSA include daytime sleepiness, memory problems, mood irritability and mood disorder such as depression and anxiety, lack of energy, as well as recurrent headaches, especially morning headaches. We talked about trying to maintain a healthy lifestyle in general, as well as the importance of weight control. I encouraged the patient to eat healthy, exercise daily and keep well hydrated, to keep a scheduled bedtime and wake time routine, to not skip any meals and eat healthy snacks in between meals. I advised the patient not to drive when feeling sleepy. I recommended the following at this time: sleep study with potential positive airway pressure titration. (We will score hypopneas at 4% and split the sleep study into diagnostic and treatment portion, if the estimated. 2 hour AHI is >15/h).   I explained the sleep test procedure to the patient and also outlined possible surgical and non-surgical treatment options of OSA, including the use of a custom-made dental device (which would require a referral to a specialist dentist or oral surgeon), upper airway surgical options, such as pillar implants, radiofrequency surgery, tongue base surgery, and UPPP (which would involve a referral to an ENT surgeon). Rarely, jaw surgery such as mandibular advancement may be considered.  I also explained the CPAP treatment option to the patient, who indicated that she would be willing to try CPAP if the need arises. I explained the importance of being compliant with PAP treatment, not only for insurance purposes but primarily to improve Her symptoms,  and  for the patient's long term health benefit, including to reduce Her cardiovascular risks. I answered all  Their questions today and the patient and her husband were in agreement. I would like to see her back after the sleep study is completed and encouraged her to call with any interim questions, concerns, problems or updates.   Thank you very much for allowing me to participate in the care of this nice patient. If I can be of any further assistance to you please do not hesitate to call me at [phone].  Sincerely,   Star Age, MD, PhD

## 2014-05-08 ENCOUNTER — Ambulatory Visit (INDEPENDENT_AMBULATORY_CARE_PROVIDER_SITE_OTHER): Payer: Commercial Managed Care - HMO | Admitting: Neurology

## 2014-05-08 VITALS — BP 195/90

## 2014-05-08 DIAGNOSIS — G4720 Circadian rhythm sleep disorder, unspecified type: Secondary | ICD-10-CM

## 2014-05-08 DIAGNOSIS — G4733 Obstructive sleep apnea (adult) (pediatric): Secondary | ICD-10-CM

## 2014-05-09 NOTE — Sleep Study (Signed)
Please see the scanned sleep study interpretation located in the Procedure tab within the Chart Review section. 

## 2014-05-17 ENCOUNTER — Telehealth: Payer: Self-pay | Admitting: Neurology

## 2014-05-17 DIAGNOSIS — G4733 Obstructive sleep apnea (adult) (pediatric): Secondary | ICD-10-CM

## 2014-05-17 NOTE — Telephone Encounter (Signed)
Please call and notify the patient that the recent sleep study did confirm the diagnosis of obstructive sleep apnea - numbers are mild but study was limited by poor sleep efficiency absence of REM sleep and deep sleep, likely underestimating her AHI and O2 nadir.  Given her sleep-related complaints, I recommend treatment of her OSA for this in the form of CPAP. This will require, ideally, a repeat sleep study for proper titration and mask fitting. Please explain to patient and arrange for a CPAP titration study. Her husband likely will stay with her again? I have placed an order in the chart. Thanks, Star Age, MD, PhD [facility])

## 2014-05-18 ENCOUNTER — Encounter: Payer: Self-pay | Admitting: Neurology

## 2014-05-18 NOTE — Telephone Encounter (Signed)
Patient was contacted and provided the results of her sleep study.  She was informed that a repeat study with the use of CPAP was recommended by the physician to treat her diagnosed mild sleep apnea.  Patient agreed and scheduled her appointment on February 24th at 08:00 pm.  Dr. Redmond School was faxed a copy of the report.

## 2014-05-19 DIAGNOSIS — G894 Chronic pain syndrome: Secondary | ICD-10-CM | POA: Diagnosis not present

## 2014-05-19 DIAGNOSIS — Z681 Body mass index (BMI) 19 or less, adult: Secondary | ICD-10-CM | POA: Diagnosis not present

## 2014-05-19 DIAGNOSIS — R634 Abnormal weight loss: Secondary | ICD-10-CM | POA: Diagnosis not present

## 2014-05-30 ENCOUNTER — Ambulatory Visit (INDEPENDENT_AMBULATORY_CARE_PROVIDER_SITE_OTHER): Payer: Commercial Managed Care - HMO | Admitting: Internal Medicine

## 2014-05-30 ENCOUNTER — Encounter (INDEPENDENT_AMBULATORY_CARE_PROVIDER_SITE_OTHER): Payer: Self-pay | Admitting: Internal Medicine

## 2014-05-30 VITALS — BP 114/70 | HR 64 | Temp 97.0°F | Resp 16 | Ht 61.0 in | Wt 169.9 lb

## 2014-05-30 DIAGNOSIS — K219 Gastro-esophageal reflux disease without esophagitis: Secondary | ICD-10-CM | POA: Diagnosis not present

## 2014-05-30 DIAGNOSIS — R634 Abnormal weight loss: Secondary | ICD-10-CM

## 2014-05-30 MED ORDER — PANTOPRAZOLE SODIUM 40 MG PO TBEC: 40.0000 mg | DELAYED_RELEASE_TABLET | Freq: Every day | ORAL | Status: DC

## 2014-05-30 NOTE — Progress Notes (Signed)
Presenting complaint;  Nausea and GERD symptoms at night.  Subjective:  Patient is 81 year old Caucasian female who is here for scheduled visit. She was last seen in November 2014 for rectal bleeding and underwent colonoscopy. Now she presents with 3 months history of nausea, phlegm regurgitation and heartburn which usually occurs at night.  She feels she has phlegm or liquid in her throat and she cannot get rid of. She does not have any problems during the daytime. She also has swallowing difficulty with evening meal. She does not have good appetite. She states she forces herself to eat. On her last visit she's weight 206 pounds. She does not remember that she has ever weighed about 200 pounds. Since then she has lost 37 pounds. She has chronic back pain. She has had 4 back surgeries. Her bowels move daily. She denies rectal bleeding. Stools are dark and she is on iron. She uses albuterol inhaler at least 2 times a day. She takes pain pills 3 times a day. She drinks water and stays away from caffeine containing products and she also does not eat fried or fatty food. She moves around using a walker. Her son is presently living with her and helps her with cooking and day-to-day chores.   Current Medications: Outpatient Encounter Prescriptions as of 05/30/2014  Medication Sig  . ADVAIR DISKUS 250-50 MCG/DOSE AEPB Inhale 1 puff into the lungs 2 (two) times daily.   Marland Kitchen albuterol (PROVENTIL HFA;VENTOLIN HFA) 108 (90 BASE) MCG/ACT inhaler Inhale 2 puffs into the lungs every 6 (six) hours as needed. For shortness of breath  . ALPRAZolam (XANAX) 1 MG tablet Take 1 mg by mouth 4 (four) times daily as needed. For anxiety  . aspirin 81 MG tablet Take 81 mg by mouth daily.  . budesonide-formoterol (SYMBICORT) 80-4.5 MCG/ACT inhaler Inhale 2 puffs into the lungs 2 (two) times daily.  . calcitonin, salmon, (MIACALCIN/FORTICAL) 200 UNIT/ACT nasal spray Place 1 spray into the nose daily.  . Calcium  Carbonate-Vitamin D (CALTRATE 600+D PO) Take by mouth.  . cholecalciferol (VITAMIN D) 1000 UNITS tablet Take 1,000 Units by mouth daily.  Mariane Baumgarten Calcium (STOOL SOFTENER PO) As needed  . ferrous sulfate 325 (65 FE) MG tablet Take 325 mg by mouth 2 (two) times daily after a meal.   . FLUoxetine (PROZAC) 20 MG tablet Take 20 mg by mouth daily.  . furosemide (LASIX) 40 MG tablet Take 40 mg by mouth 2 (two) times daily.   . Garlic 2355 MG CAPS Take 1 capsule by mouth daily.  . Incontinence Supply Disposable (BLADDER CONTROL PADS EX ABSORB) MISC   . KLOR-CON M20 20 MEQ tablet Take 20 mEq by mouth 2 (two) times daily.   Marland Kitchen lactulose (CHRONULAC) 10 GM/15ML solution Take 10 g by mouth at bedtime.   . metoprolol (LOPRESSOR) 50 MG tablet Take 50 mg by mouth 2 (two) times daily.  Marland Kitchen OVER THE COUNTER MEDICATION 2 (two) times daily. Nasocort  . oxybutynin (DITROPAN) 5 MG tablet Take 5 mg by mouth 2 (two) times daily. As needed  . oxyCODONE-acetaminophen (PERCOCET) 5-325 MG per tablet Take 1 tablet by mouth every 6 (six) hours as needed for pain. For pain  . risperiDONE (RISPERDAL) 1 MG tablet Take 1 mg by mouth at bedtime.  Marland Kitchen tiZANidine (ZANAFLEX) 2 MG tablet Take 2 mg by mouth every 8 (eight) hours as needed.  . triamcinolone (NASACORT ALLERGY 24HR) 55 MCG/ACT AERO nasal inhaler Place 2 sprays into the nose daily.  Marland Kitchen  vitamin B-12 (CYANOCOBALAMIN) 1000 MCG tablet Take 1,000 mcg by mouth daily.  Marland Kitchen zolpidem (AMBIEN) 10 MG tablet Take 10 mg by mouth at bedtime as needed.  . [DISCONTINUED] Applicators (COTTON SWABS) SWAB   . [DISCONTINUED] omeprazole (PRILOSEC) 40 MG capsule    Past Medical History  Diagnosis Date  . Back pain   . Hypertension   . Complication of anesthesia     pt states"I was in a coma for 5 days after my 3rd back surgery and no one knows why". husband states she hasnt been right since then,doesnt walk right or anything" Dr Durene Cal was MD  . Fibromyalgia   . COPD (chronic obstructive  pulmonary disease)   . Anxiety   . Anemia   . Depression    Past Surgical History  Procedure Laterality Date  . Spinal cord stimulator implant    . Abdominal hysterectomy    . Appendectomy    . Cholecystectomy    . Knee surgery    . Back surgery x 4    . Cataract extraction Right   . Cataract extraction w/phaco Left 07/19/2012    Procedure: CATARACT EXTRACTION PHACO AND INTRAOCULAR LENS PLACEMENT (IOC);  Surgeon: Williams Che, MD;  Location: [facility];  Service: Ophthalmology;  Laterality: Left;  CDE 12.60  . Cardiac catheterization  11/2002    w/o signficant  (Dr. Gerrie Nordmann)  . Transthoracic echocardiogram  11/16/2012    EF 56-38%, grade 2 diastolic dysfunction; LA severely dilated; mod TR;   . Colonoscopy with propofol N/A 03/21/2013    Procedure: COLONOSCOPY WITH PROPOFOL;  Surgeon: Rogene Houston, MD;  Location: [facility];  Service: Endoscopy;  Laterality: N/A;  In cecum at 751, out at 0802 = 11 minutes total time  . Polypectomy N/A 03/21/2013    Procedure: POLYPECTOMY;  Surgeon: Rogene Houston, MD;  Location: [facility];  Service: Endoscopy;  Laterality: N/A;  . Tonsillectomy       Objective: Blood pressure 114/70, pulse 64, temperature 97 F (36.1 C), temperature source Oral, resp. rate 16, height 5\' 1"  (1.549 m), weight 169 lb 14.4 oz (77.066 kg). Patient is alert and in no acute distress. Conjunctiva is pink. Sclera is nonicteric Oropharyngeal mucosa is normal. No neck masses or thyromegaly noted. Cardiac exam with regular rhythm normal S1 and S2. No murmur or gallop noted. Lungs are clear to auscultation. Abdomen is full soft and nontender without organomegaly or masses.  No LE edema or clubbing noted.  Labs/studies Results: No recent labs available for review.    Assessment:  #1. GERD symptoms occurring primarily in the evening and at night. Patient is on multiple medications and they're from concerned she could have gastroparesis. It appears she is on gastroparesis  diet and if she does not respond to PPI therapy she will need further evaluation. #2. Weight loss. She has documented weight loss of 30 pounds since her last visit to our office of 03/08/2013 when she weighed 206 pounds. Weight loss appears to be due to anorexia.   Plan:  Anti-reflux margins reinforced. Pantoprazole 40 mg by mouth 30-60 minutes before evening meal daily. Patient will call with progress report in 2 weeks. If she does not respond to therapy will consider EGD and gastric emptying study. Office visit in 2 months.

## 2014-05-30 NOTE — Patient Instructions (Signed)
Continue anti-reflex measures.  Take pantoprazole 30-60 minutes by mouth before evening meal daily.  Call with progress report in 2 weeks.

## 2014-06-14 ENCOUNTER — Telehealth (INDEPENDENT_AMBULATORY_CARE_PROVIDER_SITE_OTHER): Payer: Self-pay | Admitting: *Deleted

## 2014-06-14 NOTE — Telephone Encounter (Signed)
Gift called to let Dr. Laural Golden know the Pantoprazole DR 40 mg is helping but she is having right much diarrhea. Her return phone number if [phone].

## 2014-06-14 NOTE — Telephone Encounter (Signed)
Patient called. She states that the diarrhea has stopped. It had lasted for a bout a week or so. It did Not start when patient started the Pantoprazole. She is not sure if she ate something that did not agree with her. Patient states that she is doing fine and will call our office is symptoms occur again.

## 2014-06-21 NOTE — Telephone Encounter (Signed)
Black to the patient is feeling better. She will call if diarrhea recurs.

## 2014-07-12 DIAGNOSIS — Z6832 Body mass index (BMI) 32.0-32.9, adult: Secondary | ICD-10-CM | POA: Diagnosis not present

## 2014-07-12 DIAGNOSIS — E6609 Other obesity due to excess calories: Secondary | ICD-10-CM | POA: Diagnosis not present

## 2014-07-12 DIAGNOSIS — J180 Bronchopneumonia, unspecified organism: Secondary | ICD-10-CM | POA: Diagnosis not present

## 2014-07-12 DIAGNOSIS — R609 Edema, unspecified: Secondary | ICD-10-CM | POA: Diagnosis not present

## 2014-07-25 ENCOUNTER — Ambulatory Visit (INDEPENDENT_AMBULATORY_CARE_PROVIDER_SITE_OTHER): Payer: Commercial Managed Care - HMO | Admitting: Internal Medicine

## 2014-07-25 ENCOUNTER — Encounter (INDEPENDENT_AMBULATORY_CARE_PROVIDER_SITE_OTHER): Payer: Self-pay | Admitting: Internal Medicine

## 2014-07-25 VITALS — BP 118/76 | HR 64 | Temp 96.4°F | Resp 18 | Ht 61.0 in | Wt 170.1 lb

## 2014-07-25 DIAGNOSIS — K219 Gastro-esophageal reflux disease without esophagitis: Secondary | ICD-10-CM

## 2014-07-25 NOTE — Progress Notes (Signed)
Presenting complaint;  Follow-up for GERD nausea and vomiting.  Subjective:  Patient is 81 year old Caucasian female with multiple medical bruise here for scheduled visit. She was last seen on 05/30/2014 felt to have GERD and possibly gastroparesis. She was begun on pantoprazole. She also complained over 35 on weight loss and proceeding few months. She says she is having less heartburn but she continues to have postprandial regurgitation and vomiting of predominant froggy fluid after evening meal but she admits she eats too much food. She has craving for chicken soup and she eats whole can on most evenings. She states she had nausea and vomiting every night for 3 weeks but hasn't done so for the last 5 weeks. She was easily treated with Cipro for URI which she finished 3 days ago. She has not lost anymore weight. He denies hematemesis melena or rectal bleeding. She remains with back pain for which she is taking pain medication.   Current Medications: Outpatient Encounter Prescriptions as of 07/25/2014  Medication Sig  . ADVAIR DISKUS 250-50 MCG/DOSE AEPB Inhale 1 puff into the lungs 2 (two) times daily.   Marland Kitchen albuterol (PROVENTIL HFA;VENTOLIN HFA) 108 (90 BASE) MCG/ACT inhaler Inhale 2 puffs into the lungs 2 (two) times daily as needed for shortness of breath. For shortness of breath  . ALPRAZolam (XANAX) 1 MG tablet Take 1 mg by mouth 4 (four) times daily as needed. For anxiety  . aspirin 81 MG tablet Take 81 mg by mouth daily.  . budesonide-formoterol (SYMBICORT) 80-4.5 MCG/ACT inhaler Inhale 2 puffs into the lungs 2 (two) times daily.  . calcitonin, salmon, (MIACALCIN/FORTICAL) 200 UNIT/ACT nasal spray Place 1 spray into the nose daily.  . Calcium Carbonate-Vitamin D (CALTRATE 600+D PO) Take by mouth daily.   . cholecalciferol (VITAMIN D) 1000 UNITS tablet Take 1,000 Units by mouth daily.  Mariane Baumgarten Calcium (STOOL SOFTENER PO) As needed  . ferrous sulfate 325 (65 FE) MG tablet Take 325 mg  by mouth 2 (two) times daily after a meal.   . FLUoxetine (PROZAC) 20 MG tablet Take 20 mg by mouth daily.  . furosemide (LASIX) 40 MG tablet Take 40 mg by mouth 2 (two) times daily.   . Garlic 2458 MG CAPS Take 1 capsule by mouth daily.  . Incontinence Supply Disposable (BLADDER CONTROL PADS EX ABSORB) MISC   . KLOR-CON M20 20 MEQ tablet Take 20 mEq by mouth 2 (two) times daily.   Marland Kitchen lactulose (CHRONULAC) 10 GM/15ML solution Take 10 g by mouth at bedtime.   . metoprolol (LOPRESSOR) 50 MG tablet Take 50 mg by mouth 2 (two) times daily.  Marland Kitchen omeprazole (PRILOSEC) 40 MG capsule Take 40 mg by mouth daily.   Marland Kitchen OVER THE COUNTER MEDICATION 2 (two) times daily. Nasocort  . oxybutynin (DITROPAN) 5 MG tablet Take 5 mg by mouth 2 (two) times daily. As needed  . oxyCODONE-acetaminophen (PERCOCET) 5-325 MG per tablet Take 1 tablet by mouth every 6 (six) hours as needed for pain. For pain  . risperiDONE (RISPERDAL) 1 MG tablet Take 1 mg by mouth at bedtime.  . triamcinolone (NASACORT ALLERGY 24HR) 55 MCG/ACT AERO nasal inhaler Place 2 sprays into the nose daily.  . vitamin B-12 (CYANOCOBALAMIN) 1000 MCG tablet Take 1,000 mcg by mouth daily.  Marland Kitchen zolpidem (AMBIEN) 10 MG tablet Take 10 mg by mouth at bedtime as needed.  . [DISCONTINUED] pantoprazole (PROTONIX) 40 MG tablet Take 1 tablet (40 mg total) by mouth daily before supper. (Patient not taking: Reported on  07/25/2014)  . [DISCONTINUED] tiZANidine (ZANAFLEX) 2 MG tablet Take 2 mg by mouth every 8 (eight) hours as needed.     Objective: Blood pressure 118/76, pulse 64, temperature 96.4 F (35.8 C), temperature source Oral, resp. rate 18, height 5\' 1"  (1.549 m), weight 170 lb 1.6 oz (77.157 kg). She was alert and in no acute distress. She is using walker to move around. Conjunctiva is pink. Sclera is nonicteric Oropharyngeal mucosa is normal. No neck masses or thyromegaly noted. Cardiac exam with regular rhythm normal S1 and S2. No murmur or gallop  noted. Lungs are clear to auscultation. Abdomen is full but soft and nontender without organomegaly or masses.  No LE edema or clubbing noted.    Assessment:  #1. GERD. Heartburn appears to be controlled with therapy. #2. Nausea and vomiting. She had symptoms daily for 3 weeks but not in the last 5 weeks. Suspect she may have gastroparesis and she may be eating too much at evening meal.. #2. History of weight loss. She has not lost any weight since her last visit. Will continue to monitor weight trend.   Plan:  Patient advised to decrease food consumption at evening meal. She will keep symptom diary for two weeks and give [place] a call. If symptoms relapse or proceed with EGD. Office visit in 3 months.

## 2014-07-25 NOTE — Patient Instructions (Signed)
Keep symptom diary and call office with progress report in 2 weeks

## 2014-08-08 ENCOUNTER — Telehealth (INDEPENDENT_AMBULATORY_CARE_PROVIDER_SITE_OTHER): Payer: Self-pay | Admitting: *Deleted

## 2014-08-08 NOTE — Telephone Encounter (Signed)
Lisa Saunders said to let Dr. Laural Golden know she is doing pretty good. Still has a little phelm in her throat about a couple times a week but otherwise doing good.

## 2014-08-08 NOTE — Telephone Encounter (Signed)
Dr.Rehman was made aware. 

## 2014-08-10 DIAGNOSIS — G894 Chronic pain syndrome: Secondary | ICD-10-CM | POA: Diagnosis not present

## 2014-08-10 DIAGNOSIS — I4891 Unspecified atrial fibrillation: Secondary | ICD-10-CM | POA: Diagnosis not present

## 2014-08-10 DIAGNOSIS — K219 Gastro-esophageal reflux disease without esophagitis: Secondary | ICD-10-CM | POA: Diagnosis not present

## 2014-08-10 DIAGNOSIS — Z6832 Body mass index (BMI) 32.0-32.9, adult: Secondary | ICD-10-CM | POA: Diagnosis not present

## 2014-08-10 DIAGNOSIS — E669 Obesity, unspecified: Secondary | ICD-10-CM | POA: Diagnosis not present

## 2014-08-10 DIAGNOSIS — F419 Anxiety disorder, unspecified: Secondary | ICD-10-CM | POA: Diagnosis not present

## 2014-09-14 ENCOUNTER — Encounter (INDEPENDENT_AMBULATORY_CARE_PROVIDER_SITE_OTHER): Payer: Self-pay | Admitting: *Deleted

## 2014-10-09 DIAGNOSIS — F419 Anxiety disorder, unspecified: Secondary | ICD-10-CM | POA: Diagnosis not present

## 2014-10-09 DIAGNOSIS — E6609 Other obesity due to excess calories: Secondary | ICD-10-CM | POA: Diagnosis not present

## 2014-10-09 DIAGNOSIS — Z6831 Body mass index (BMI) 31.0-31.9, adult: Secondary | ICD-10-CM | POA: Diagnosis not present

## 2014-10-09 DIAGNOSIS — G894 Chronic pain syndrome: Secondary | ICD-10-CM | POA: Diagnosis not present

## 2014-12-18 ENCOUNTER — Encounter (INDEPENDENT_AMBULATORY_CARE_PROVIDER_SITE_OTHER): Payer: Self-pay | Admitting: Internal Medicine

## 2014-12-18 ENCOUNTER — Ambulatory Visit (INDEPENDENT_AMBULATORY_CARE_PROVIDER_SITE_OTHER): Payer: Commercial Managed Care - HMO | Admitting: Internal Medicine

## 2014-12-18 VITALS — BP 110/70 | HR 66 | Temp 96.7°F | Resp 18 | Ht 61.0 in | Wt 164.2 lb

## 2014-12-18 DIAGNOSIS — K219 Gastro-esophageal reflux disease without esophagitis: Secondary | ICD-10-CM | POA: Insufficient documentation

## 2014-12-18 DIAGNOSIS — R634 Abnormal weight loss: Secondary | ICD-10-CM

## 2014-12-18 NOTE — Progress Notes (Signed)
Presenting complaint;  Follow-up for GERD and weight loss.  Subjective:  Patient is 81 year old Caucasian female who is here for scheduled visit. She was last seen on 07/25/2014. She does not remember the last time she threw up. She has heartburn once or twice a week. She did experience an episode of dysphagia yesterday while she was eating hot dog. She remembers she did not chew her food well. Plans of dry mouth. Her bowels move daily with help. She denies melena or rectal bleeding. She does not have good appetite. She has lost 6 pounds since her last visit but in 2 months prior to that she had not lost any weight. In November 2014 she weighed 206 pounds. She says she is happy that she is losing weight even though she is not trying to do so. She complains of constant back pain. She takes 8 pain pills a day. She states it is not enough for her. She is using walker to ambulate.   Current Medications: Outpatient Encounter Prescriptions as of 12/18/2014  Medication Sig  . ADVAIR DISKUS 250-50 MCG/DOSE AEPB Inhale 1 puff into the lungs 2 (two) times daily.   Marland Kitchen albuterol (PROVENTIL HFA;VENTOLIN HFA) 108 (90 BASE) MCG/ACT inhaler Inhale 2 puffs into the lungs 2 (two) times daily as needed for shortness of breath. For shortness of breath  . ALPRAZolam (XANAX) 1 MG tablet Take 1 mg by mouth 4 (four) times daily as needed. For anxiety  . aspirin 81 MG tablet Take 81 mg by mouth daily.  . budesonide-formoterol (SYMBICORT) 80-4.5 MCG/ACT inhaler Inhale 2 puffs into the lungs 2 (two) times daily.  . calcitonin, salmon, (MIACALCIN/FORTICAL) 200 UNIT/ACT nasal spray Place 1 spray into the nose daily.  . Calcium Carbonate-Vitamin D (CALTRATE 600+D PO) Take by mouth daily.   . cholecalciferol (VITAMIN D) 1000 UNITS tablet Take 1,000 Units by mouth daily.  Mariane Baumgarten Calcium (STOOL SOFTENER PO) As needed  . ferrous sulfate 325 (65 FE) MG tablet Take 325 mg by mouth 2 (two) times daily after a meal.   .  FLUoxetine (PROZAC) 20 MG tablet Take 20 mg by mouth daily.  . furosemide (LASIX) 40 MG tablet Take 40 mg by mouth 2 (two) times daily.   . Garlic 6333 MG CAPS Take 1 capsule by mouth daily.  . Incontinence Supply Disposable (BLADDER CONTROL PADS EX ABSORB) MISC   . KLOR-CON M20 20 MEQ tablet Take 20 mEq by mouth 2 (two) times daily.   Marland Kitchen lactulose (CHRONULAC) 10 GM/15ML solution Take 10 g by mouth at bedtime.   . metoprolol (LOPRESSOR) 50 MG tablet Take 50 mg by mouth 2 (two) times daily.  Marland Kitchen omeprazole (PRILOSEC) 40 MG capsule Take 40 mg by mouth daily.   Marland Kitchen OVER THE COUNTER MEDICATION 2 (two) times daily. Nasocort  . oxyCODONE-acetaminophen (PERCOCET) 5-325 MG per tablet Take 2 tablets by mouth every 6 (six) hours as needed for pain. For pain  . risperiDONE (RISPERDAL) 1 MG tablet Take 1 mg by mouth at bedtime.  . triamcinolone (NASACORT ALLERGY 24HR) 55 MCG/ACT AERO nasal inhaler Place 2 sprays into the nose daily.  . vitamin B-12 (CYANOCOBALAMIN) 1000 MCG tablet Take 1,000 mcg by mouth daily.  Marland Kitchen zolpidem (AMBIEN) 10 MG tablet Take 10 mg by mouth at bedtime as needed.  Marland Kitchen oxybutynin (DITROPAN) 5 MG tablet Take 5 mg by mouth 2 (two) times daily. As needed   No facility-administered encounter medications on file as of 12/18/2014.     Objective: Blood  pressure 110/70, pulse 66, temperature 96.7 F (35.9 C), temperature source Oral, resp. rate 18, height 5\' 1"  (1.549 m), weight 164 lb 3.2 oz (74.481 kg). Patient is alert and in no acute distress. Conjunctiva is pink. Sclera is nonicteric Oropharyngeal mucosa is dry. No neck masses or thyromegaly noted. Cardiac exam with regular rhythm normal S1 and S2. No murmur or gallop noted. Lungs are clear to auscultation. Abdomen is soft and nontender without organomegaly or masses. No LE edema or clubbing noted.   Assessment:  #1. GERD. Heartburn is reasonably well controlled with therapy. She may have an underlying gastroparesis secondary to  medications. Will not pursue with further workup as long as symptoms are well controlled. #2. Weight loss. She has lost 42 pounds in the last 22 months. Weight loss appears to be due to decrease her appetite secondary to her medications. BMI is still 31. Therefore she is not malnourished by any means. However if weight loss continues she will need to be further evaluated.   Plan:  Continue omeprazole at 40 mg by mouth every morning. Call if vomiting spells relapse in which case we'll proceed with EGD. Weight check in 3 months.  Office visit in 6 months

## 2014-12-18 NOTE — Patient Instructions (Addendum)
Weight check in 3 months. Please make sure Dr. Gerarda Fraction  in agreement about she taking lesser dose of furosemide on a fluid pill. Please remember to eat evening meal or supper at least three hours before going to bed. Notify if you vomiting spells relapse or you have swallowing difficulty with solid food.

## 2014-12-27 ENCOUNTER — Other Ambulatory Visit (HOSPITAL_COMMUNITY): Payer: Self-pay | Admitting: Internal Medicine

## 2014-12-27 DIAGNOSIS — Z139 Encounter for screening, unspecified: Secondary | ICD-10-CM

## 2014-12-27 DIAGNOSIS — J45909 Unspecified asthma, uncomplicated: Secondary | ICD-10-CM | POA: Diagnosis not present

## 2014-12-27 DIAGNOSIS — Z1389 Encounter for screening for other disorder: Secondary | ICD-10-CM | POA: Diagnosis not present

## 2014-12-27 DIAGNOSIS — E6609 Other obesity due to excess calories: Secondary | ICD-10-CM | POA: Diagnosis not present

## 2014-12-27 DIAGNOSIS — M1991 Primary osteoarthritis, unspecified site: Secondary | ICD-10-CM | POA: Diagnosis not present

## 2014-12-27 DIAGNOSIS — G894 Chronic pain syndrome: Secondary | ICD-10-CM | POA: Diagnosis not present

## 2014-12-27 DIAGNOSIS — I10 Essential (primary) hypertension: Secondary | ICD-10-CM | POA: Diagnosis not present

## 2014-12-27 DIAGNOSIS — Z6832 Body mass index (BMI) 32.0-32.9, adult: Secondary | ICD-10-CM | POA: Diagnosis not present

## 2015-01-08 ENCOUNTER — Ambulatory Visit (HOSPITAL_COMMUNITY)
Admission: RE | Admit: 2015-01-08 | Discharge: 2015-01-08 | Disposition: A | Discharge status: Home / Self Care | Payer: Commercial Managed Care - HMO | Source: Ambulatory Visit | Attending: Internal Medicine | Admitting: Internal Medicine

## 2015-01-08 DIAGNOSIS — Z1231 Encounter for screening mammogram for malignant neoplasm of breast: Secondary | ICD-10-CM | POA: Diagnosis not present

## 2015-01-08 DIAGNOSIS — Z139 Encounter for screening, unspecified: Secondary | ICD-10-CM

## 2015-01-24 ENCOUNTER — Encounter (INDEPENDENT_AMBULATORY_CARE_PROVIDER_SITE_OTHER): Payer: Self-pay | Admitting: *Deleted

## 2015-02-12 ENCOUNTER — Other Ambulatory Visit (INDEPENDENT_AMBULATORY_CARE_PROVIDER_SITE_OTHER): Payer: Self-pay | Admitting: Internal Medicine

## 2015-02-14 DIAGNOSIS — Z23 Encounter for immunization: Secondary | ICD-10-CM | POA: Diagnosis not present

## 2015-03-09 DIAGNOSIS — R110 Nausea: Secondary | ICD-10-CM | POA: Diagnosis not present

## 2015-03-09 DIAGNOSIS — Z1389 Encounter for screening for other disorder: Secondary | ICD-10-CM | POA: Diagnosis not present

## 2015-03-09 DIAGNOSIS — Z681 Body mass index (BMI) 19 or less, adult: Secondary | ICD-10-CM | POA: Diagnosis not present

## 2015-03-09 DIAGNOSIS — I4891 Unspecified atrial fibrillation: Secondary | ICD-10-CM | POA: Diagnosis not present

## 2015-03-09 DIAGNOSIS — I10 Essential (primary) hypertension: Secondary | ICD-10-CM | POA: Diagnosis not present

## 2015-05-25 DIAGNOSIS — G894 Chronic pain syndrome: Secondary | ICD-10-CM | POA: Diagnosis not present

## 2015-05-25 DIAGNOSIS — Z1389 Encounter for screening for other disorder: Secondary | ICD-10-CM | POA: Diagnosis not present

## 2015-05-25 DIAGNOSIS — Z6831 Body mass index (BMI) 31.0-31.9, adult: Secondary | ICD-10-CM | POA: Diagnosis not present

## 2015-05-25 DIAGNOSIS — F419 Anxiety disorder, unspecified: Secondary | ICD-10-CM | POA: Diagnosis not present

## 2015-05-25 DIAGNOSIS — I4891 Unspecified atrial fibrillation: Secondary | ICD-10-CM | POA: Diagnosis not present

## 2015-05-25 DIAGNOSIS — M1991 Primary osteoarthritis, unspecified site: Secondary | ICD-10-CM | POA: Diagnosis not present

## 2015-06-20 DIAGNOSIS — Z6832 Body mass index (BMI) 32.0-32.9, adult: Secondary | ICD-10-CM | POA: Diagnosis not present

## 2015-06-20 DIAGNOSIS — Z0000 Encounter for general adult medical examination without abnormal findings: Secondary | ICD-10-CM | POA: Diagnosis not present

## 2015-06-20 DIAGNOSIS — Z1389 Encounter for screening for other disorder: Secondary | ICD-10-CM | POA: Diagnosis not present

## 2015-06-20 DIAGNOSIS — E6609 Other obesity due to excess calories: Secondary | ICD-10-CM | POA: Diagnosis not present

## 2015-06-26 ENCOUNTER — Ambulatory Visit (INDEPENDENT_AMBULATORY_CARE_PROVIDER_SITE_OTHER): Payer: Commercial Managed Care - HMO | Admitting: Internal Medicine

## 2015-06-26 ENCOUNTER — Encounter (INDEPENDENT_AMBULATORY_CARE_PROVIDER_SITE_OTHER): Payer: Self-pay | Admitting: Internal Medicine

## 2015-06-26 VITALS — BP 118/80 | HR 67 | Temp 97.4°F | Resp 18 | Ht 61.0 in | Wt 163.4 lb

## 2015-06-26 DIAGNOSIS — K5900 Constipation, unspecified: Secondary | ICD-10-CM

## 2015-06-26 DIAGNOSIS — R634 Abnormal weight loss: Secondary | ICD-10-CM

## 2015-06-26 DIAGNOSIS — K219 Gastro-esophageal reflux disease without esophagitis: Secondary | ICD-10-CM | POA: Diagnosis not present

## 2015-06-26 NOTE — Patient Instructions (Addendum)
Can take omeprazole 30 minutes before breakfast or evening meal daily.

## 2015-06-26 NOTE — Progress Notes (Signed)
Presenting complaint;  Follow-up for weight loss GERD and constipation.  Subjective:  Patient is 82 year old Caucasian female who is here for scheduled visit accompanied by her husband. She was last seen on 12/18/2014. She has not lost any more weight. She states heartburn is well controlled with therapy. She has occasional nausea at night. She also has few episodes where she vomits but its small amount of phlegm. She denies dysphagia. She says her bowels move every other day. Renowned and she feels she is constipated. She denies melena or rectal bleeding. She continues complain of back pain. She states if it was not for pain medications she would not have any quality to her life. She is using a walker to move around.   Current Medications: Outpatient Encounter Prescriptions as of 06/26/2015  Medication Sig  . ADVAIR DISKUS 250-50 MCG/DOSE AEPB Inhale 1 puff into the lungs 2 (two) times daily.   Marland Kitchen albuterol (PROVENTIL HFA;VENTOLIN HFA) 108 (90 BASE) MCG/ACT inhaler Inhale 2 puffs into the lungs 2 (two) times daily as needed for shortness of breath. For shortness of breath  . ALPRAZolam (XANAX) 1 MG tablet Take 1 mg by mouth 4 (four) times daily as needed. For anxiety  . aspirin 81 MG tablet Take 81 mg by mouth daily.  . budesonide-formoterol (SYMBICORT) 80-4.5 MCG/ACT inhaler Inhale 2 puffs into the lungs 2 (two) times daily.  . calcitonin, salmon, (MIACALCIN/FORTICAL) 200 UNIT/ACT nasal spray Place 1 spray into the nose daily.  . Calcium Carbonate-Vitamin D (CALTRATE 600+D PO) Take by mouth daily.   . cholecalciferol (VITAMIN D) 1000 UNITS tablet Take 1,000 Units by mouth daily.  . Cranberry 360 MG CAPS Take 300 mg by mouth daily.  Mariane Baumgarten Calcium (STOOL SOFTENER PO) Take 100 mg by mouth 2 (two) times daily.  . ferrous sulfate 325 (65 FE) MG tablet Take 325 mg by mouth 2 (two) times daily after a meal.   . FLUoxetine (PROZAC) 20 MG tablet Take 20 mg by mouth daily.  . furosemide (LASIX) 40  MG tablet Take 40 mg by mouth 3 (three) times daily.   . Garlic 123XX123 MG CAPS Take 1 capsule by mouth daily.  . Incontinence Supply Disposable (BLADDER CONTROL PADS EX ABSORB) MISC   . KLOR-CON M20 20 MEQ tablet Take 20 mEq by mouth 2 (two) times daily.   Marland Kitchen lactulose (CHRONULAC) 10 GM/15ML solution Take 10 g by mouth at bedtime.   . metoprolol (LOPRESSOR) 50 MG tablet Take 50 mg by mouth 2 (two) times daily.  . Multiple Vitamins-Minerals (HAIR SKIN AND NAILS FORMULA PO) Take by mouth daily.  Marland Kitchen omeprazole (PRILOSEC) 40 MG capsule Take 40 mg by mouth daily.   Marland Kitchen oxyCODONE-acetaminophen (PERCOCET) 5-325 MG per tablet Take 2 tablets by mouth every 6 (six) hours as needed for pain. For pain  . pantoprazole (PROTONIX) 40 MG tablet TAKE ONE TABLET BY MOUTH ONCE DAILY BEFORE SUPPER.  Marland Kitchen potassium chloride (K-DUR) 10 MEQ tablet Take 20 mEq by mouth daily.  . psyllium (REGULOID) 0.52 g capsule Take 1.04 g by mouth daily.  . risperiDONE (RISPERDAL) 1 MG tablet Take 1 mg by mouth at bedtime.  . triamcinolone (NASACORT ALLERGY 24HR) 55 MCG/ACT AERO nasal inhaler Place 2 sprays into the nose daily.  . vitamin B-12 (CYANOCOBALAMIN) 1000 MCG tablet Take 1,000 mcg by mouth daily.  Marland Kitchen zolpidem (AMBIEN) 10 MG tablet Take 10 mg by mouth at bedtime as needed.  Marland Kitchen oxybutynin (DITROPAN) 5 MG tablet Take 5 mg by mouth  2 (two) times daily. Reported on 06/26/2015  . [DISCONTINUED] OVER THE COUNTER MEDICATION 2 (two) times daily. Reported on 06/26/2015   No facility-administered encounter medications on file as of 06/26/2015.     Objective: Blood pressure 118/80, pulse 67, temperature 97.4 F (36.3 C), temperature source Oral, resp. rate 18, height 5\' 1"  (1.549 m), weight 163 lb 6.4 oz (74.118 kg). Patient is alert and in no acute distress. Conjunctiva is pink. Sclera is nonicteric Oropharyngeal mucosa is normal. No neck masses or thyromegaly noted. Cardiac exam with regular rhythm normal S1 and S2. No murmur or gallop  noted. Lungs are clear to auscultation. Abdomen is full but soft and nontender without organomegaly or masses. No LE edema or clubbing noted.    Assessment:  #1.  Weight loss. Patient's weight seems to have leveled off. Most of her weight loss occurred over 2 years ago. She's only lost 6 pounds in the last one year but one in the last 6 months. While initial weight was involuntary she could benefit by losing few more pounds since her BMI is around 31. #2. GERD. Symptoms are well controlled with therapy. She is on omeprazole as well as pantoprazole. I do not believe she needs to take both medications. #3.  Constipation. Constipation is secondary to limited physical activity and pain medications. She should try taking Colace on schedule rather than when necessary basis.    Plan:  Continue omeprazole 40 mg by mouth every morning Discontinue pantoprazole. Continue lactulose at current dose and take Colace 100 mg by mouth twice a day. Benefiber 4 g by mouth daily at bedtime. Office visit in one year.

## 2015-07-12 DIAGNOSIS — K0889 Other specified disorders of teeth and supporting structures: Secondary | ICD-10-CM | POA: Diagnosis not present

## 2015-07-12 DIAGNOSIS — G894 Chronic pain syndrome: Secondary | ICD-10-CM | POA: Diagnosis not present

## 2015-07-12 DIAGNOSIS — Z6831 Body mass index (BMI) 31.0-31.9, adult: Secondary | ICD-10-CM | POA: Diagnosis not present

## 2015-07-12 DIAGNOSIS — K219 Gastro-esophageal reflux disease without esophagitis: Secondary | ICD-10-CM | POA: Diagnosis not present

## 2015-09-07 DIAGNOSIS — N3946 Mixed incontinence: Secondary | ICD-10-CM | POA: Diagnosis not present

## 2015-09-07 DIAGNOSIS — N3020 Other chronic cystitis without hematuria: Secondary | ICD-10-CM | POA: Diagnosis not present

## 2015-09-07 DIAGNOSIS — N3944 Nocturnal enuresis: Secondary | ICD-10-CM | POA: Diagnosis not present

## 2015-10-08 DIAGNOSIS — N3090 Cystitis, unspecified without hematuria: Secondary | ICD-10-CM | POA: Diagnosis not present

## 2015-10-08 DIAGNOSIS — R8290 Unspecified abnormal findings in urine: Secondary | ICD-10-CM | POA: Diagnosis not present

## 2015-10-16 DIAGNOSIS — I4891 Unspecified atrial fibrillation: Secondary | ICD-10-CM | POA: Diagnosis not present

## 2015-10-16 DIAGNOSIS — F1120 Opioid dependence, uncomplicated: Secondary | ICD-10-CM | POA: Diagnosis not present

## 2015-10-16 DIAGNOSIS — Z681 Body mass index (BMI) 19 or less, adult: Secondary | ICD-10-CM | POA: Diagnosis not present

## 2015-10-16 DIAGNOSIS — Z1389 Encounter for screening for other disorder: Secondary | ICD-10-CM | POA: Diagnosis not present

## 2015-10-16 DIAGNOSIS — G894 Chronic pain syndrome: Secondary | ICD-10-CM | POA: Diagnosis not present

## 2015-10-16 DIAGNOSIS — F419 Anxiety disorder, unspecified: Secondary | ICD-10-CM | POA: Diagnosis not present

## 2015-10-17 DIAGNOSIS — N3090 Cystitis, unspecified without hematuria: Secondary | ICD-10-CM | POA: Diagnosis not present

## 2015-11-08 ENCOUNTER — Other Ambulatory Visit (INDEPENDENT_AMBULATORY_CARE_PROVIDER_SITE_OTHER): Payer: Self-pay | Admitting: Internal Medicine

## 2015-11-26 DIAGNOSIS — N3944 Nocturnal enuresis: Secondary | ICD-10-CM | POA: Diagnosis not present

## 2015-11-26 DIAGNOSIS — N3020 Other chronic cystitis without hematuria: Secondary | ICD-10-CM | POA: Diagnosis not present

## 2015-11-26 DIAGNOSIS — N3946 Mixed incontinence: Secondary | ICD-10-CM | POA: Diagnosis not present

## 2015-12-19 DIAGNOSIS — E6609 Other obesity due to excess calories: Secondary | ICD-10-CM | POA: Diagnosis not present

## 2015-12-19 DIAGNOSIS — G894 Chronic pain syndrome: Secondary | ICD-10-CM | POA: Diagnosis not present

## 2015-12-19 DIAGNOSIS — G4709 Other insomnia: Secondary | ICD-10-CM | POA: Diagnosis not present

## 2015-12-19 DIAGNOSIS — F419 Anxiety disorder, unspecified: Secondary | ICD-10-CM | POA: Diagnosis not present

## 2015-12-19 DIAGNOSIS — Z1389 Encounter for screening for other disorder: Secondary | ICD-10-CM | POA: Diagnosis not present

## 2015-12-19 DIAGNOSIS — Z6831 Body mass index (BMI) 31.0-31.9, adult: Secondary | ICD-10-CM | POA: Diagnosis not present

## 2016-01-25 DIAGNOSIS — Z1389 Encounter for screening for other disorder: Secondary | ICD-10-CM | POA: Diagnosis not present

## 2016-01-25 DIAGNOSIS — I10 Essential (primary) hypertension: Secondary | ICD-10-CM | POA: Diagnosis not present

## 2016-01-25 DIAGNOSIS — Z681 Body mass index (BMI) 19 or less, adult: Secondary | ICD-10-CM | POA: Diagnosis not present

## 2016-01-25 DIAGNOSIS — G894 Chronic pain syndrome: Secondary | ICD-10-CM | POA: Diagnosis not present

## 2016-01-25 DIAGNOSIS — R600 Localized edema: Secondary | ICD-10-CM | POA: Diagnosis not present

## 2016-01-25 DIAGNOSIS — M5417 Radiculopathy, lumbosacral region: Secondary | ICD-10-CM | POA: Diagnosis not present

## 2016-02-19 ENCOUNTER — Emergency Department (HOSPITAL_COMMUNITY)
Admission: EM | Admit: 2016-02-19 | Discharge: 2016-02-19 | Disposition: A | Discharge status: Home / Self Care | Payer: Commercial Managed Care - HMO | Attending: Emergency Medicine | Admitting: Emergency Medicine

## 2016-02-19 ENCOUNTER — Encounter (HOSPITAL_COMMUNITY): Payer: Self-pay | Admitting: Emergency Medicine

## 2016-02-19 ENCOUNTER — Emergency Department (HOSPITAL_COMMUNITY): Payer: Commercial Managed Care - HMO

## 2016-02-19 DIAGNOSIS — R0789 Other chest pain: Secondary | ICD-10-CM | POA: Diagnosis not present

## 2016-02-19 DIAGNOSIS — Y999 Unspecified external cause status: Secondary | ICD-10-CM | POA: Diagnosis not present

## 2016-02-19 DIAGNOSIS — Y929 Unspecified place or not applicable: Secondary | ICD-10-CM | POA: Diagnosis not present

## 2016-02-19 DIAGNOSIS — R109 Unspecified abdominal pain: Secondary | ICD-10-CM

## 2016-02-19 DIAGNOSIS — I10 Essential (primary) hypertension: Secondary | ICD-10-CM | POA: Diagnosis not present

## 2016-02-19 DIAGNOSIS — R1084 Generalized abdominal pain: Secondary | ICD-10-CM | POA: Diagnosis not present

## 2016-02-19 DIAGNOSIS — W010XXA Fall on same level from slipping, tripping and stumbling without subsequent striking against object, initial encounter: Secondary | ICD-10-CM | POA: Diagnosis not present

## 2016-02-19 DIAGNOSIS — J449 Chronic obstructive pulmonary disease, unspecified: Secondary | ICD-10-CM | POA: Diagnosis not present

## 2016-02-19 DIAGNOSIS — N390 Urinary tract infection, site not specified: Secondary | ICD-10-CM

## 2016-02-19 DIAGNOSIS — Z87891 Personal history of nicotine dependence: Secondary | ICD-10-CM | POA: Insufficient documentation

## 2016-02-19 DIAGNOSIS — Y939 Activity, unspecified: Secondary | ICD-10-CM | POA: Diagnosis not present

## 2016-02-19 DIAGNOSIS — Z7982 Long term (current) use of aspirin: Secondary | ICD-10-CM | POA: Diagnosis not present

## 2016-02-19 DIAGNOSIS — S3991XA Unspecified injury of abdomen, initial encounter: Secondary | ICD-10-CM | POA: Diagnosis not present

## 2016-02-19 DIAGNOSIS — Z79899 Other long term (current) drug therapy: Secondary | ICD-10-CM | POA: Insufficient documentation

## 2016-02-19 DIAGNOSIS — W19XXXA Unspecified fall, initial encounter: Secondary | ICD-10-CM

## 2016-02-19 DIAGNOSIS — S299XXA Unspecified injury of thorax, initial encounter: Secondary | ICD-10-CM | POA: Diagnosis not present

## 2016-02-19 HISTORY — DX: Other chronic pain: G89.29

## 2016-02-19 HISTORY — DX: Other chronic cystitis without hematuria: N30.20

## 2016-02-19 HISTORY — DX: Mixed incontinence: N39.46

## 2016-02-19 LAB — CBC WITH DIFFERENTIAL/PLATELET
BASOS ABS: 0 10*3/uL (ref 0.0–0.1)
Basophils Relative: 0 %
Eosinophils Absolute: 0.6 10*3/uL (ref 0.0–0.7)
Eosinophils Relative: 8 %
HEMATOCRIT: 37.9 % (ref 36.0–46.0)
HEMOGLOBIN: 12.4 g/dL (ref 12.0–15.0)
LYMPHS PCT: 21 %
Lymphs Abs: 1.5 10*3/uL (ref 0.7–4.0)
MCH: 31.4 pg (ref 26.0–34.0)
MCHC: 32.7 g/dL (ref 30.0–36.0)
MCV: 95.9 fL (ref 78.0–100.0)
MONO ABS: 0.8 10*3/uL (ref 0.1–1.0)
Monocytes Relative: 11 %
NEUTROS ABS: 4.2 10*3/uL (ref 1.7–7.7)
Neutrophils Relative %: 59 %
Platelets: 392 10*3/uL (ref 150–400)
RBC: 3.95 MIL/uL (ref 3.87–5.11)
RDW: 12.6 % (ref 11.5–15.5)
WBC: 7.1 10*3/uL (ref 4.0–10.5)

## 2016-02-19 LAB — URINE MICROSCOPIC-ADD ON

## 2016-02-19 LAB — URINALYSIS, ROUTINE W REFLEX MICROSCOPIC
Bilirubin Urine: NEGATIVE
Glucose, UA: NEGATIVE mg/dL
Ketones, ur: NEGATIVE mg/dL
Nitrite: NEGATIVE
PROTEIN: NEGATIVE mg/dL
Specific Gravity, Urine: <1.005 — ABNORMAL LOW (ref 1.005–1.030)
pH: 6.5 (ref 5.0–8.0)

## 2016-02-19 LAB — COMPREHENSIVE METABOLIC PANEL
ALK PHOS: 77 U/L (ref 38–126)
ALT: 11 U/L — ABNORMAL LOW (ref 14–54)
ANION GAP: 9 (ref 5–15)
AST: 19 U/L (ref 15–41)
Albumin: 4 g/dL (ref 3.5–5.0)
BILIRUBIN TOTAL: 0.5 mg/dL (ref 0.3–1.2)
BUN: 13 mg/dL (ref 6–20)
CALCIUM: 8.8 mg/dL — AB (ref 8.9–10.3)
CO2: 27 mmol/L (ref 22–32)
Chloride: 96 mmol/L — ABNORMAL LOW (ref 101–111)
Creatinine, Ser: 0.74 mg/dL (ref 0.44–1.00)
GFR calc non Af Amer: >60 mL/min (ref >60)
Glucose, Bld: 99 mg/dL (ref 65–99)
Potassium: 3.6 mmol/L (ref 3.5–5.1)
SODIUM: 132 mmol/L — AB (ref 135–145)
TOTAL PROTEIN: 7.2 g/dL (ref 6.5–8.1)

## 2016-02-19 LAB — LIPASE, BLOOD: Lipase: 18 U/L (ref 11–51)

## 2016-02-19 MED ORDER — CEPHALEXIN 500 MG PO CAPS
500.0000 mg | ORAL_CAPSULE | Freq: Four times a day (QID) | ORAL | 0 refills | Status: AC
Start: 2016-02-19 — End: ?

## 2016-02-19 MED ORDER — IOPAMIDOL (ISOVUE-300) INJECTION 61%
100.0000 mL | Freq: Once | INTRAVENOUS | Status: AC | PRN
  Administered 2016-02-19: 100 mL via INTRAVENOUS

## 2016-02-19 NOTE — ED Notes (Signed)
Spoke with lab at this time, specimens were redrawn and are being run at this time.

## 2016-02-19 NOTE — ED Provider Notes (Signed)
[facility] DEPT Provider Note   CSN: [id number] Arrival date & time: 02/19/16  1126     History   Chief Complaint Chief Complaint  Patient presents with  . Fall    HPI Lisa Saunders is a 82 y.o. female.  HPI  Pt was seen at 1220. Per pt and her family, c/o sudden onset and resolution of one episode of fall 3 weeks ago. Pt states she slipped and fell onto her right side. Pt continues to c/o right sided ribs and abd "pain," as well as acute flair of her chronic LBP, since the fall. Pt states her PMD "sent her here" for evaluation. Pt denies head injury, no LOC, no AMS, no neck pain, no change in her usual chronic back pain, no N/V/D, no fevers, no rash, no palpitations, no SOB/cough.     Past Medical History:  Diagnosis Date  . Anemia   . Anxiety   . Back pain   . Chronic cystitis   . Chronic pain   . Complication of anesthesia    pt states"I was in a coma for 5 days after my 3rd back surgery and no one knows why". husband states she hasnt been right since then,doesnt walk right or anything" Dr Durene Cal was MD  . COPD (chronic obstructive pulmonary disease) ([facility])   . Depression   . Fibromyalgia   . Hypertension   . Mixed incontinence     Patient Active Problem List   Diagnosis Date Noted  . GERD (gastroesophageal reflux disease) 12/18/2014  . Weight loss 12/18/2014  . PAF (paroxysmal atrial fibrillation) ([facility]) 03/09/2013  . Heme positive stool 03/08/2013  . Leg edema 10/26/2012  . Constipation 03/24/2012  . Hypertension 07/08/2011  . Back pain 07/08/2011  . Muscle weakness (generalized) 01/13/2011  . Abnormality of gait 01/13/2011  . Difficulty in walking(719.7) 01/13/2011    Past Surgical History:  Procedure Laterality Date  . ABDOMINAL HYSTERECTOMY    . APPENDECTOMY    . back surgery x 4    . CARDIAC CATHETERIZATION  11/2002   w/o signficant  (Dr. Gerrie Nordmann)  . CATARACT EXTRACTION Right   . CATARACT EXTRACTION W/PHACO Left 07/19/2012   Procedure: CATARACT EXTRACTION PHACO AND INTRAOCULAR LENS PLACEMENT (IOC);  Surgeon: Williams Che, MD;  Location: [facility];  Service: Ophthalmology;  Laterality: Left;  CDE 12.60  . CHOLECYSTECTOMY    . COLONOSCOPY WITH PROPOFOL N/A 03/21/2013   Procedure: COLONOSCOPY WITH PROPOFOL;  Surgeon: Rogene Houston, MD;  Location: [facility];  Service: Endoscopy;  Laterality: N/A;  In cecum at 751, out at 0802 = 11 minutes total time  . KNEE SURGERY    . POLYPECTOMY N/A 03/21/2013   Procedure: POLYPECTOMY;  Surgeon: Rogene Houston, MD;  Location: [facility];  Service: Endoscopy;  Laterality: N/A;  . SPINAL CORD STIMULATOR IMPLANT    . TONSILLECTOMY    . TRANSTHORACIC ECHOCARDIOGRAM  11/16/2012   EF 0000000, grade 2 diastolic dysfunction; LA severely dilated; mod TR;        Home Medications    Prior to Admission medications   Medication Sig Start Date End Date Taking? Authorizing Provider  ADVAIR DISKUS 250-50 MCG/DOSE AEPB Inhale 1 puff into the lungs 2 (two) times daily.  06/09/11  Yes Historical Provider, MD  albuterol (PROVENTIL HFA;VENTOLIN HFA) 108 (90 BASE) MCG/ACT inhaler Inhale 2 puffs into the lungs 2 (two) times daily as needed for shortness of breath. For shortness of breath   Yes Historical Provider, MD  ALPRAZolam (XANAX) 1 MG tablet Take 1 mg by mouth 4 (four) times daily as needed for anxiety. For anxiety 06/25/11  Yes Historical Provider, MD  aspirin 81 MG tablet Take 81 mg by mouth daily.   Yes Historical Provider, MD  budesonide-formoterol (SYMBICORT) 80-4.5 MCG/ACT inhaler Inhale 2 puffs into the lungs 2 (two) times daily.   Yes Historical Provider, MD  calcitonin, salmon, (MIACALCIN/FORTICAL) 200 UNIT/ACT nasal spray Place 1 spray into the nose daily.   Yes Historical Provider, MD  Calcium Carbonate-Vitamin D (CALTRATE 600+D PO) Take 1 tablet by mouth daily.    Yes Historical Provider, MD  cholecalciferol (VITAMIN D) 1000 UNITS tablet Take 1,000 Units by mouth daily.   Yes Historical  Provider, MD  Cranberry 360 MG CAPS Take 300 mg by mouth daily.   Yes Historical Provider, MD  Docusate Calcium (STOOL SOFTENER PO) Take 100 mg by mouth 2 (two) times daily. 03/07/14  Yes Historical Provider, MD  ferrous sulfate 325 (65 FE) MG tablet Take 325 mg by mouth 2 (two) times daily after a meal.    Yes Historical Provider, MD  FLUoxetine (PROZAC) 20 MG tablet Take 20 mg by mouth daily.   Yes Historical Provider, MD  furosemide (LASIX) 40 MG tablet Take 40 mg by mouth 2 (two) times daily.  06/23/11  Yes Historical Provider, MD  Garlic 123XX123 MG CAPS Take 1 capsule by mouth daily.   Yes Historical Provider, MD  KLOR-CON M20 20 MEQ tablet Take 20 mEq by mouth 2 (two) times daily.  04/29/11  Yes Historical Provider, MD  metoprolol (LOPRESSOR) 50 MG tablet Take 50 mg by mouth 2 (two) times daily.   Yes Historical Provider, MD  Multiple Vitamins-Minerals (HAIR SKIN AND NAILS FORMULA PO) Take 1 tablet by mouth 2 (two) times daily.    Yes Historical Provider, MD  oxybutynin (DITROPAN) 5 MG tablet Take 5 mg by mouth 2 (two) times daily. Reported on 06/26/2015   Yes Historical Provider, MD  oxyCODONE (OXY IR/ROXICODONE) 5 MG immediate release tablet Take 5 mg by mouth every 4 (four) hours as needed for severe pain. Take a max of 2 tablets per day with percocet for severe pain.   Yes Historical Provider, MD  oxyCODONE-acetaminophen (PERCOCET) 10-325 MG tablet Take 1 tablet by mouth every 4 (four) hours as needed for pain.    Yes Historical Provider, MD  pantoprazole (PROTONIX) 40 MG tablet TAKE ONE TABLET BY MOUTH ONCE DAILY BEFORE SUPPER. 11/08/15  Yes Rogene Houston, MD  psyllium (REGULOID) 0.52 g capsule Take 1.04 g by mouth daily.   Yes Historical Provider, MD  risperiDONE (RISPERDAL) 1 MG tablet Take 1 mg by mouth at bedtime.   Yes Historical Provider, MD  vitamin B-12 (CYANOCOBALAMIN) 1000 MCG tablet Take 1,000 mcg by mouth daily.   Yes Historical Provider, MD  zolpidem (AMBIEN) 10 MG tablet Take 10 mg  by mouth at bedtime as needed for sleep.    Yes Historical Provider, MD  Incontinence Supply Disposable (BLADDER CONTROL PADS EX ABSORB) MISC  03/06/14   Historical Provider, MD    Family History Family History  Problem Relation Age of Onset  . Heart failure Mother   . Heart attack Father     Social History Social History  Substance Use Topics  . Smoking status: Former Smoker    Quit date: 04/21/1964  . Smokeless tobacco: Never Used  . Alcohol use No     Allergies   Morphine; Prednisone; Procardia [nifedipine]; and Latex  Review of Systems Review of Systems ROS: Statement: All systems negative except as marked or noted in the HPI; Constitutional: Negative for fever and chills. ; ; Eyes: Negative for eye pain, redness and discharge. ; ; ENMT: Negative for ear pain, hoarseness, nasal congestion, sinus pressure and sore throat. ; ; Cardiovascular: Negative for chest pain, palpitations, diaphoresis, dyspnea and peripheral edema. ; ; Respiratory: Negative for cough, wheezing and stridor. ; ; Gastrointestinal: +abd pain. Negative for nausea, vomiting, diarrhea, blood in stool, hematemesis, jaundice and rectal bleeding. . ; ; Genitourinary: Negative for dysuria, flank pain and hematuria. ; ; Musculoskeletal: +chronic back pain, right ribs pain. Negative for neck pain. Negative for swelling and deformity.; ; Skin: Negative for pruritus, rash, abrasions, blisters, bruising and skin lesion.; ; Neuro: Negative for headache, lightheadedness and neck stiffness. Negative for weakness, altered level of consciousness, altered mental status, extremity weakness, paresthesias, involuntary movement, seizure and syncope.       Physical Exam Updated Vital Signs BP 139/78 (BP Location: Right Arm)   Pulse 67   Temp 98 F (36.7 C) (Oral)   Resp 17   Ht 5\' 3"  (1.6 m)   Wt 159 lb (72.1 kg)   SpO2 100%   BMI 28.17 kg/m   Physical Exam 1225: Physical examination:  Nursing notes reviewed; Vital signs  and O2 SAT reviewed;  Constitutional: Well developed, Well nourished, Well hydrated, In no acute distress; Head:  Normocephalic, atraumatic; Eyes: EOMI, PERRL, No scleral icterus; ENMT: Mouth and pharynx normal, Mucous membranes moist; Neck: Supple, Full range of motion, No lymphadenopathy; Cardiovascular: Regular rate and rhythm, No gallop; Respiratory: Breath sounds clear & equal bilaterally, No wheezes.  Speaking full sentences with ease, Normal respiratory effort/excursion; Chest: +mild right lateral ribs tenderness to palp. No soft tissue crepitus, no deformity. Movement normal; Abdomen: Soft, +mild diffuse tenderness to palp. No rebound or guarding. Nondistended, Normal bowel sounds; Genitourinary: No CVA tenderness; Spine:  No midline CS, TS, LS tenderness.;; Extremities: Pulses normal, No tenderness, No edema, No calf edema or asymmetry.; Neuro: AA&Ox3, Major CN grossly intact.  Speech clear. No gross focal motor deficits in extremities.; Skin: Color normal, Warm, Dry.   ED Treatments / Results  Labs (all labs ordered are listed, but only abnormal results are displayed)   EKG  EKG Interpretation None       Radiology   Procedures Procedures (including critical care time)  Medications Ordered in ED Medications - No data to display   Initial Impression / Assessment and Plan / ED Course  I have reviewed the triage vital signs and the nursing notes.  Pertinent labs & imaging results that were available during my care of the patient were reviewed by me and considered in my medical decision making (see chart for details).  MDM Reviewed: previous chart, nursing note and vitals Reviewed previous: labs Interpretation: labs and CT scan   Results for orders placed or performed during the hospital encounter of 02/19/16  Comprehensive metabolic panel  Result Value Ref Range   Sodium 132 (L) 135 - 145 mmol/L   Potassium 3.6 3.5 - 5.1 mmol/L   Chloride 96 (L) 101 - 111 mmol/L   CO2  27 22 - 32 mmol/L   Glucose, Bld 99 65 - 99 mg/dL   BUN 13 6 - 20 mg/dL   Creatinine, Ser 0.74 0.44 - 1.00 mg/dL   Calcium 8.8 (L) 8.9 - 10.3 mg/dL   Total Protein 7.2 6.5 - 8.1 g/dL   Albumin 4.0 3.5 -  5.0 g/dL   AST 19 15 - 41 U/L   ALT 11 (L) 14 - 54 U/L   Alkaline Phosphatase 77 38 - 126 U/L   Total Bilirubin 0.5 0.3 - 1.2 mg/dL   GFR calc non Af Amer >60 >60 mL/min   GFR calc Af Amer >60 >60 mL/min   Anion gap 9 5 - 15  Lipase, blood  Result Value Ref Range   Lipase 18 11 - 51 U/L  CBC with Differential  Result Value Ref Range   WBC  4.0 - 10.5 K/uL    PATIENT IDENTIFICATION ERROR. PLEASE DISREGARD RESULTS. ACCOUNT WILL BE CREDITED.   RBC  3.87 - 5.11 MIL/uL    PATIENT IDENTIFICATION ERROR. PLEASE DISREGARD RESULTS. ACCOUNT WILL BE CREDITED.   Hemoglobin  12.0 - 15.0 g/dL    PATIENT IDENTIFICATION ERROR. PLEASE DISREGARD RESULTS. ACCOUNT WILL BE CREDITED.   HCT  36.0 - 46.0 %    PATIENT IDENTIFICATION ERROR. PLEASE DISREGARD RESULTS. ACCOUNT WILL BE CREDITED.   MCV  78.0 - 100.0 fL    PATIENT IDENTIFICATION ERROR. PLEASE DISREGARD RESULTS. ACCOUNT WILL BE CREDITED.   MCH  26.0 - 34.0 pg    PATIENT IDENTIFICATION ERROR. PLEASE DISREGARD RESULTS. ACCOUNT WILL BE CREDITED.   MCHC  30.0 - 36.0 g/dL    PATIENT IDENTIFICATION ERROR. PLEASE DISREGARD RESULTS. ACCOUNT WILL BE CREDITED.   RDW  11.5 - 15.5 %    PATIENT IDENTIFICATION ERROR. PLEASE DISREGARD RESULTS. ACCOUNT WILL BE CREDITED.   Platelets  150 - 400 K/uL    PATIENT IDENTIFICATION ERROR. PLEASE DISREGARD RESULTS. ACCOUNT WILL BE CREDITED.   Neutrophils Relative %  %    PATIENT IDENTIFICATION ERROR. PLEASE DISREGARD RESULTS. ACCOUNT WILL BE CREDITED.   Neutro Abs  1.7 - 7.7 K/uL    PATIENT IDENTIFICATION ERROR. PLEASE DISREGARD RESULTS. ACCOUNT WILL BE CREDITED.   Lymphocytes Relative  %    PATIENT IDENTIFICATION ERROR. PLEASE DISREGARD RESULTS. ACCOUNT WILL BE CREDITED.   Lymphs Abs  0.7 - 4.0 K/uL    PATIENT  IDENTIFICATION ERROR. PLEASE DISREGARD RESULTS. ACCOUNT WILL BE CREDITED.   Monocytes Relative  %    PATIENT IDENTIFICATION ERROR. PLEASE DISREGARD RESULTS. ACCOUNT WILL BE CREDITED.   Monocytes Absolute  0.1 - 1.0 K/uL    PATIENT IDENTIFICATION ERROR. PLEASE DISREGARD RESULTS. ACCOUNT WILL BE CREDITED.   Eosinophils Relative  %    PATIENT IDENTIFICATION ERROR. PLEASE DISREGARD RESULTS. ACCOUNT WILL BE CREDITED.   Eosinophils Absolute  0.0 - 0.7 K/uL    PATIENT IDENTIFICATION ERROR. PLEASE DISREGARD RESULTS. ACCOUNT WILL BE CREDITED.   Basophils Relative  %    PATIENT IDENTIFICATION ERROR. PLEASE DISREGARD RESULTS. ACCOUNT WILL BE CREDITED.   Basophils Absolute  0.0 - 0.1 K/uL    PATIENT IDENTIFICATION ERROR. PLEASE DISREGARD RESULTS. ACCOUNT WILL BE CREDITED.   WBC Morphology      PATIENT IDENTIFICATION ERROR. PLEASE DISREGARD RESULTS. ACCOUNT WILL BE CREDITED.  Urinalysis, Routine w reflex microscopic  Result Value Ref Range   Color, Urine YELLOW YELLOW   APPearance CLEAR CLEAR   Specific Gravity, Urine <1.005 (L) 1.005 - 1.030   pH 6.5 5.0 - 8.0   Glucose, UA NEGATIVE NEGATIVE mg/dL   Hgb urine dipstick TRACE (A) NEGATIVE   Bilirubin Urine NEGATIVE NEGATIVE   Ketones, ur NEGATIVE NEGATIVE mg/dL   Protein, ur NEGATIVE NEGATIVE mg/dL   Nitrite NEGATIVE NEGATIVE   Leukocytes, UA LARGE (A) NEGATIVE  Urine microscopic-add on  Result Value Ref Range  Squamous Epithelial / LPF 6-30 (A) NONE SEEN   WBC, UA 6-30 0 - 5 WBC/hpf   RBC / HPF 0-5 0 - 5 RBC/hpf   Bacteria, UA MANY (A) NONE SEEN  CBC with Differential/Platelet  Result Value Ref Range   WBC 7.1 4.0 - 10.5 K/uL   RBC 3.95 3.87 - 5.11 MIL/uL   Hemoglobin 12.4 12.0 - 15.0 g/dL   HCT 37.9 36.0 - 46.0 %   MCV 95.9 78.0 - 100.0 fL   MCH 31.4 26.0 - 34.0 pg   MCHC 32.7 30.0 - 36.0 g/dL   RDW 12.6 11.5 - 15.5 %   Platelets 392 150 - 400 K/uL   Neutrophils Relative % 59 %   Neutro Abs 4.2 1.7 - 7.7 K/uL   Lymphocytes  Relative 21 %   Lymphs Abs 1.5 0.7 - 4.0 K/uL   Monocytes Relative 11 %   Monocytes Absolute 0.8 0.1 - 1.0 K/uL   Eosinophils Relative 8 %   Eosinophils Absolute 0.6 0.0 - 0.7 K/uL   Basophils Relative 0 %   Basophils Absolute 0.0 0.0 - 0.1 K/uL   Ct Chest W Contrast Result Date: 02/19/2016 CLINICAL DATA:  Status post fall two weeks ago EXAM: CT CHEST, ABDOMEN, AND PELVIS WITH CONTRAST TECHNIQUE: Multidetector CT imaging of the chest, abdomen and pelvis was performed following the standard protocol during bolus administration of intravenous contrast. CONTRAST:  166mL ISOVUE-300 IOPAMIDOL (ISOVUE-300) INJECTION 61% COMPARISON:  None. FINDINGS: CT CHEST FINDINGS Cardiovascular: There is atherosclerotic calcification within the thoracic aorta and the proximal arch vessels. There is a normal 3 vessel aortic branching pattern. No aneurysm or dissection is seen. There is biatrial cardiac enlargement. No pericardial effusion. Mediastinum/Nodes: Level 4 lymph node measures 1.2 cm. Otherwise, no enlarged or abnormal density mediastinal or axillary lymph nodes. Thyroid gland is normal. There is a medium-sized sliding-type hiatal hernia. Lungs/Pleura: There is bilateral apical predominant emphysema. No pleural effusion. No focal consolidation. No pulmonary nodules or masses. Musculoskeletal: There is a thoracic spinal stimulator device with tip terminating at the T7 level. No bony spinal canal stenosis. CT ABDOMEN PELVIS FINDINGS Hepatobiliary: No pneumobilia or portal venous gas. No ascites. No focal hepatic lesions. The gallbladder is surgically absent. There is mild postcholecystectomy extrahepatic biliary dilatation. Pancreas: Mild fatty atrophy of the pancreas. No peripancreatic fluid collection. Spleen: Normal Adrenals/Urinary Tract: Normal adrenal glands. No hydronephrosis or solid renal mass. Lobulated contours of both kidneys. No nephrolithiasis. Stomach/Bowel: Medium-sized sliding hiatal hernia. No dilated  small bowel. No evidence of acute colonic or enteric inflammation. Despite review of multiplanar reformatted images in 3 orthogonal planes, the appendix could not be adequately visualized. However, there is no inflammatory stranding or free fluid within the right lower quadrant. Vascular/Lymphatic: There is atherosclerotic calcification of the non aneurysmal abdominal aorta and its branch vessels. The splenic vein and main portal vein are patent. There is hypoattenuation within the superior mesenteric vein, likely contrast mixing artifact. Reproductive: Status post hysterectomy. Ovaries not clearly identified. No free fluid the pelvis. Other: No abdominal wall hernia or abnormality. No abdominopelvic ascites. Musculoskeletal: Compression deformities of the T4, T5 and T6 vertebral bodies are unchanged compared to 01/15/2007. Compression fracture of the T10 body has worsened from 2008. There is grade 2 L4-L5 anterolisthesis. L4-L5 posterior spinal fusion hardware are with screws traversing the left pedicles shows no focal hardware abnormality. There is inferior subsidence of the disc spacer at the superior L5 endplate. IMPRESSION: 1. Multiple thoracic compression fractures. Fractures at T4-T6  are chronic and unchanged compared to 01/15/2007. The T10 fracture is new compared to 08/01/2010, but otherwise age indeterminate. 2. Medium-sized hiatal hernia. 3. Aortic atherosclerosis. Electronically Signed   By: Ulyses Jarred M.D.   On: 02/19/2016 16:28   Ct Abdomen Pelvis W Contrast Result Date: 02/19/2016 CLINICAL DATA:  Status post fall two weeks ago EXAM: CT CHEST, ABDOMEN, AND PELVIS WITH CONTRAST TECHNIQUE: Multidetector CT imaging of the chest, abdomen and pelvis was performed following the standard protocol during bolus administration of intravenous contrast. CONTRAST:  168mL ISOVUE-300 IOPAMIDOL (ISOVUE-300) INJECTION 61% COMPARISON:  None. FINDINGS: CT CHEST FINDINGS Cardiovascular: There is atherosclerotic  calcification within the thoracic aorta and the proximal arch vessels. There is a normal 3 vessel aortic branching pattern. No aneurysm or dissection is seen. There is biatrial cardiac enlargement. No pericardial effusion. Mediastinum/Nodes: Level 4 lymph node measures 1.2 cm. Otherwise, no enlarged or abnormal density mediastinal or axillary lymph nodes. Thyroid gland is normal. There is a medium-sized sliding-type hiatal hernia. Lungs/Pleura: There is bilateral apical predominant emphysema. No pleural effusion. No focal consolidation. No pulmonary nodules or masses. Musculoskeletal: There is a thoracic spinal stimulator device with tip terminating at the T7 level. No bony spinal canal stenosis. CT ABDOMEN PELVIS FINDINGS Hepatobiliary: No pneumobilia or portal venous gas. No ascites. No focal hepatic lesions. The gallbladder is surgically absent. There is mild postcholecystectomy extrahepatic biliary dilatation. Pancreas: Mild fatty atrophy of the pancreas. No peripancreatic fluid collection. Spleen: Normal Adrenals/Urinary Tract: Normal adrenal glands. No hydronephrosis or solid renal mass. Lobulated contours of both kidneys. No nephrolithiasis. Stomach/Bowel: Medium-sized sliding hiatal hernia. No dilated small bowel. No evidence of acute colonic or enteric inflammation. Despite review of multiplanar reformatted images in 3 orthogonal planes, the appendix could not be adequately visualized. However, there is no inflammatory stranding or free fluid within the right lower quadrant. Vascular/Lymphatic: There is atherosclerotic calcification of the non aneurysmal abdominal aorta and its branch vessels. The splenic vein and main portal vein are patent. There is hypoattenuation within the superior mesenteric vein, likely contrast mixing artifact. Reproductive: Status post hysterectomy. Ovaries not clearly identified. No free fluid the pelvis. Other: No abdominal wall hernia or abnormality. No abdominopelvic ascites.  Musculoskeletal: Compression deformities of the T4, T5 and T6 vertebral bodies are unchanged compared to 01/15/2007. Compression fracture of the T10 body has worsened from 2008. There is grade 2 L4-L5 anterolisthesis. L4-L5 posterior spinal fusion hardware are with screws traversing the left pedicles shows no focal hardware abnormality. There is inferior subsidence of the disc spacer at the superior L5 endplate. IMPRESSION: 1. Multiple thoracic compression fractures. Fractures at T4-T6 are chronic and unchanged compared to 01/15/2007. The T10 fracture is new compared to 08/01/2010, but otherwise age indeterminate. 2. Medium-sized hiatal hernia. 3. Aortic atherosclerosis. Electronically Signed   By: Ulyses Jarred M.D.   On: 02/19/2016 16:28     1645:  Will tx for UTI. Workup otherwise with chronic findings. Pt states she wants to go home now. Pt already has multiple narcotic pain meds. Dx and testing d/w pt and family.  Questions answered.  Verb understanding, agreeable to d/c home with outpt f/u.   Final Clinical Impressions(s) / ED Diagnoses   Final diagnoses:  None    New Prescriptions New Prescriptions   No medications on file     Francine Graven, DO 02/20/16 2026

## 2016-02-19 NOTE — ED Notes (Signed)
Family updated at this time.

## 2016-02-19 NOTE — ED Notes (Signed)
Pt reports she feel on 10/10 onto R side and has been having R rib and abd pain. Denies N/V/D/, fever, bowel/bladder dysfunction, or LOC. Reports a normal BM yesterday. Pt's husband also reports pt has a "stimulator" placed by R lower back and wants it to eval.

## 2016-02-19 NOTE — ED Triage Notes (Signed)
Patient's husband states patient fell October 10th and has had back pain and right abdominal pain and flank pain since the fall. States no xrays after fall. NAD in traige.

## 2016-02-19 NOTE — Discharge Instructions (Signed)
Take the prescription as directed.  Apply moist heat or ice to the area(s) of discomfort, for 15 minutes at a time, several times per day for the next few days.  Do not fall asleep on a heating or ice pack.  Call your regular medical doctor tomorrow to schedule a follow up appointment this week.  Return to the Emergency Department immediately if worsening. ° °

## 2016-02-21 LAB — URINE CULTURE

## 2016-03-12 DIAGNOSIS — Z23 Encounter for immunization: Secondary | ICD-10-CM | POA: Diagnosis not present

## 2016-03-12 DIAGNOSIS — Z6831 Body mass index (BMI) 31.0-31.9, adult: Secondary | ICD-10-CM | POA: Diagnosis not present

## 2016-03-12 DIAGNOSIS — G894 Chronic pain syndrome: Secondary | ICD-10-CM | POA: Diagnosis not present

## 2016-03-12 DIAGNOSIS — J069 Acute upper respiratory infection, unspecified: Secondary | ICD-10-CM | POA: Diagnosis not present

## 2016-03-12 DIAGNOSIS — N3000 Acute cystitis without hematuria: Secondary | ICD-10-CM | POA: Diagnosis not present

## 2016-04-08 DIAGNOSIS — J42 Unspecified chronic bronchitis: Secondary | ICD-10-CM | POA: Diagnosis not present

## 2016-04-08 DIAGNOSIS — Z1389 Encounter for screening for other disorder: Secondary | ICD-10-CM | POA: Diagnosis not present

## 2016-04-08 DIAGNOSIS — Z681 Body mass index (BMI) 19 or less, adult: Secondary | ICD-10-CM | POA: Diagnosis not present

## 2016-04-08 DIAGNOSIS — E669 Obesity, unspecified: Secondary | ICD-10-CM | POA: Diagnosis not present

## 2016-04-08 DIAGNOSIS — G894 Chronic pain syndrome: Secondary | ICD-10-CM | POA: Diagnosis not present

## 2016-04-08 DIAGNOSIS — M1991 Primary osteoarthritis, unspecified site: Secondary | ICD-10-CM | POA: Diagnosis not present

## 2016-04-30 ENCOUNTER — Encounter (INDEPENDENT_AMBULATORY_CARE_PROVIDER_SITE_OTHER): Payer: Self-pay | Admitting: Internal Medicine

## 2016-04-30 ENCOUNTER — Encounter (INDEPENDENT_AMBULATORY_CARE_PROVIDER_SITE_OTHER): Payer: Self-pay

## 2016-06-23 DIAGNOSIS — N390 Urinary tract infection, site not specified: Secondary | ICD-10-CM | POA: Diagnosis not present

## 2016-06-23 DIAGNOSIS — I4891 Unspecified atrial fibrillation: Secondary | ICD-10-CM | POA: Diagnosis not present

## 2016-06-23 DIAGNOSIS — I872 Venous insufficiency (chronic) (peripheral): Secondary | ICD-10-CM | POA: Diagnosis not present

## 2016-06-23 DIAGNOSIS — G894 Chronic pain syndrome: Secondary | ICD-10-CM | POA: Diagnosis not present

## 2016-06-23 DIAGNOSIS — R300 Dysuria: Secondary | ICD-10-CM | POA: Diagnosis not present

## 2016-06-23 DIAGNOSIS — Z681 Body mass index (BMI) 19 or less, adult: Secondary | ICD-10-CM | POA: Diagnosis not present

## 2016-06-23 DIAGNOSIS — R600 Localized edema: Secondary | ICD-10-CM | POA: Diagnosis not present

## 2016-06-30 ENCOUNTER — Telehealth: Payer: Self-pay | Admitting: Internal Medicine

## 2016-06-30 NOTE — Telephone Encounter (Signed)
Received page to after hours [facility] emergency line pager. Patient is not our clinic patient and does not have any recent hospitalizations under our care. Her PCP is listed as Dr. Gerarda Fraction, which she confirmed. I instructed her that she should call his office and request to speak to a doctor from his practice. Patient upset but I explained to her that I could not provide medical advice/care over the phone for a patient that was not affiliated with our practice.   Phill Myron, D.O. 06/30/2016, 6:05 PM PGY-2, [place]

## 2016-07-01 DIAGNOSIS — R300 Dysuria: Secondary | ICD-10-CM | POA: Diagnosis not present

## 2016-07-04 ENCOUNTER — Other Ambulatory Visit (HOSPITAL_COMMUNITY): Payer: Self-pay | Admitting: Internal Medicine

## 2016-07-04 DIAGNOSIS — Z1231 Encounter for screening mammogram for malignant neoplasm of breast: Secondary | ICD-10-CM

## 2016-07-10 ENCOUNTER — Ambulatory Visit (HOSPITAL_COMMUNITY): Payer: Commercial Managed Care - HMO

## 2016-07-22 ENCOUNTER — Emergency Department (HOSPITAL_COMMUNITY): Payer: Medicare HMO

## 2016-07-22 ENCOUNTER — Inpatient Hospital Stay (HOSPITAL_COMMUNITY)
Admission: EM | Admit: 2016-07-22 | Discharge: 2016-08-19 | DRG: 641 | Disposition: E | Payer: Medicare HMO | Attending: Internal Medicine | Admitting: Internal Medicine

## 2016-07-22 ENCOUNTER — Encounter (HOSPITAL_COMMUNITY): Payer: Self-pay | Admitting: Emergency Medicine

## 2016-07-22 DIAGNOSIS — R930 Abnormal findings on diagnostic imaging of skull and head, not elsewhere classified: Secondary | ICD-10-CM | POA: Diagnosis not present

## 2016-07-22 DIAGNOSIS — F329 Major depressive disorder, single episode, unspecified: Secondary | ICD-10-CM | POA: Diagnosis present

## 2016-07-22 DIAGNOSIS — Z66 Do not resuscitate: Secondary | ICD-10-CM | POA: Diagnosis present

## 2016-07-22 DIAGNOSIS — Z7951 Long term (current) use of inhaled steroids: Secondary | ICD-10-CM | POA: Diagnosis not present

## 2016-07-22 DIAGNOSIS — R269 Unspecified abnormalities of gait and mobility: Secondary | ICD-10-CM | POA: Diagnosis not present

## 2016-07-22 DIAGNOSIS — I110 Hypertensive heart disease with heart failure: Secondary | ICD-10-CM | POA: Diagnosis present

## 2016-07-22 DIAGNOSIS — K625 Hemorrhage of anus and rectum: Secondary | ICD-10-CM | POA: Diagnosis not present

## 2016-07-22 DIAGNOSIS — Z7189 Other specified counseling: Secondary | ICD-10-CM

## 2016-07-22 DIAGNOSIS — Z79899 Other long term (current) drug therapy: Secondary | ICD-10-CM

## 2016-07-22 DIAGNOSIS — K921 Melena: Secondary | ICD-10-CM | POA: Diagnosis not present

## 2016-07-22 DIAGNOSIS — G8929 Other chronic pain: Secondary | ICD-10-CM | POA: Diagnosis not present

## 2016-07-22 DIAGNOSIS — N390 Urinary tract infection, site not specified: Secondary | ICD-10-CM | POA: Diagnosis not present

## 2016-07-22 DIAGNOSIS — Z9689 Presence of other specified functional implants: Secondary | ICD-10-CM | POA: Diagnosis present

## 2016-07-22 DIAGNOSIS — Z7401 Bed confinement status: Secondary | ICD-10-CM

## 2016-07-22 DIAGNOSIS — E871 Hypo-osmolality and hyponatremia: Secondary | ICD-10-CM | POA: Diagnosis not present

## 2016-07-22 DIAGNOSIS — F419 Anxiety disorder, unspecified: Secondary | ICD-10-CM | POA: Diagnosis present

## 2016-07-22 DIAGNOSIS — J449 Chronic obstructive pulmonary disease, unspecified: Secondary | ICD-10-CM | POA: Diagnosis not present

## 2016-07-22 DIAGNOSIS — M797 Fibromyalgia: Secondary | ICD-10-CM | POA: Diagnosis present

## 2016-07-22 DIAGNOSIS — N3946 Mixed incontinence: Secondary | ICD-10-CM | POA: Diagnosis present

## 2016-07-22 DIAGNOSIS — I10 Essential (primary) hypertension: Secondary | ICD-10-CM | POA: Diagnosis present

## 2016-07-22 DIAGNOSIS — I5032 Chronic diastolic (congestive) heart failure: Secondary | ICD-10-CM | POA: Diagnosis present

## 2016-07-22 DIAGNOSIS — E872 Acidosis, unspecified: Secondary | ICD-10-CM

## 2016-07-22 DIAGNOSIS — K219 Gastro-esophageal reflux disease without esophagitis: Secondary | ICD-10-CM | POA: Diagnosis present

## 2016-07-22 DIAGNOSIS — M549 Dorsalgia, unspecified: Secondary | ICD-10-CM | POA: Diagnosis present

## 2016-07-22 DIAGNOSIS — G40901 Epilepsy, unspecified, not intractable, with status epilepticus: Secondary | ICD-10-CM | POA: Diagnosis present

## 2016-07-22 DIAGNOSIS — R404 Transient alteration of awareness: Secondary | ICD-10-CM | POA: Diagnosis not present

## 2016-07-22 DIAGNOSIS — G9340 Encephalopathy, unspecified: Secondary | ICD-10-CM | POA: Diagnosis not present

## 2016-07-22 DIAGNOSIS — G40411 Other generalized epilepsy and epileptic syndromes, intractable, with status epilepticus: Secondary | ICD-10-CM | POA: Diagnosis not present

## 2016-07-22 DIAGNOSIS — Z87891 Personal history of nicotine dependence: Secondary | ICD-10-CM | POA: Diagnosis not present

## 2016-07-22 DIAGNOSIS — A419 Sepsis, unspecified organism: Secondary | ICD-10-CM | POA: Diagnosis not present

## 2016-07-22 DIAGNOSIS — I6789 Other cerebrovascular disease: Secondary | ICD-10-CM | POA: Diagnosis not present

## 2016-07-22 DIAGNOSIS — Z7982 Long term (current) use of aspirin: Secondary | ICD-10-CM

## 2016-07-22 DIAGNOSIS — Z90710 Acquired absence of both cervix and uterus: Secondary | ICD-10-CM

## 2016-07-22 DIAGNOSIS — E876 Hypokalemia: Secondary | ICD-10-CM

## 2016-07-22 DIAGNOSIS — Z8249 Family history of ischemic heart disease and other diseases of the circulatory system: Secondary | ICD-10-CM

## 2016-07-22 DIAGNOSIS — R531 Weakness: Secondary | ICD-10-CM | POA: Diagnosis not present

## 2016-07-22 DIAGNOSIS — R4182 Altered mental status, unspecified: Secondary | ICD-10-CM

## 2016-07-22 DIAGNOSIS — Z515 Encounter for palliative care: Secondary | ICD-10-CM | POA: Diagnosis not present

## 2016-07-22 DIAGNOSIS — I340 Nonrheumatic mitral (valve) insufficiency: Secondary | ICD-10-CM | POA: Diagnosis present

## 2016-07-22 DIAGNOSIS — I480 Paroxysmal atrial fibrillation: Secondary | ICD-10-CM | POA: Diagnosis present

## 2016-07-22 LAB — URINALYSIS, ROUTINE W REFLEX MICROSCOPIC
BILIRUBIN URINE: NEGATIVE
Bilirubin Urine: NEGATIVE
GLUCOSE, UA: NEGATIVE mg/dL
Glucose, UA: NEGATIVE mg/dL
HGB URINE DIPSTICK: NEGATIVE
Hgb urine dipstick: NEGATIVE
KETONES UR: NEGATIVE mg/dL
Ketones, ur: NEGATIVE mg/dL
LEUKOCYTES UA: NEGATIVE
Leukocytes, UA: NEGATIVE
NITRITE: NEGATIVE
Nitrite: NEGATIVE
PROTEIN: NEGATIVE mg/dL
Protein, ur: NEGATIVE mg/dL
SPECIFIC GRAVITY, URINE: 1.005 (ref 1.005–1.030)
Specific Gravity, Urine: 1.008 (ref 1.005–1.030)
pH: 7 (ref 5.0–8.0)
pH: 7 (ref 5.0–8.0)

## 2016-07-22 LAB — COMPREHENSIVE METABOLIC PANEL
ALBUMIN: 3.8 g/dL (ref 3.5–5.0)
ALBUMIN: 3 g/dL — AB (ref 3.5–5.0)
ALK PHOS: 62 U/L (ref 38–126)
ALT: 26 U/L (ref 14–54)
ALT: 21 U/L (ref 14–54)
AST: 47 U/L — AB (ref 15–41)
AST: 34 U/L (ref 15–41)
Alkaline Phosphatase: 79 U/L (ref 38–126)
Anion gap: 13 (ref 5–15)
BILIRUBIN TOTAL: 0.8 mg/dL (ref 0.3–1.2)
BUN: 15 mg/dL (ref 6–20)
BUN: 14 mg/dL (ref 6–20)
CALCIUM: 7.5 mg/dL — AB (ref 8.9–10.3)
CO2: 29 mmol/L (ref 22–32)
CO2: 30 mmol/L (ref 22–32)
CREATININE: 0.84 mg/dL (ref 0.44–1.00)
CREATININE: 0.73 mg/dL (ref 0.44–1.00)
Calcium: 8.6 mg/dL — ABNORMAL LOW (ref 8.9–10.3)
Chloride: 69 mmol/L — ABNORMAL LOW (ref 101–111)
GFR calc Af Amer: >60 mL/min (ref >60)
GFR calc Af Amer: >60 mL/min (ref >60)
GFR calc non Af Amer: >60 mL/min (ref >60)
GFR calc non Af Amer: >60 mL/min (ref >60)
GLUCOSE: 126 mg/dL — AB (ref 65–99)
Glucose, Bld: 118 mg/dL — ABNORMAL HIGH (ref 65–99)
Potassium: <2 mmol/L — CL (ref 3.5–5.1)
Potassium: <2 mmol/L — CL (ref 3.5–5.1)
Sodium: 114 mmol/L — CL (ref 135–145)
Sodium: 112 mmol/L — CL (ref 135–145)
TOTAL PROTEIN: 5.9 g/dL — AB (ref 6.5–8.1)
Total Bilirubin: 1.4 mg/dL — ABNORMAL HIGH (ref 0.3–1.2)
Total Protein: 7.3 g/dL (ref 6.5–8.1)

## 2016-07-22 LAB — BASIC METABOLIC PANEL
Anion gap: 12 (ref 5–15)
Anion gap: 12 (ref 5–15)
Anion gap: 12 (ref 5–15)
Anion gap: 10 (ref 5–15)
Anion gap: 11 (ref 5–15)
BUN: 12 mg/dL (ref 6–20)
BUN: 10 mg/dL (ref 6–20)
BUN: 10 mg/dL (ref 6–20)
BUN: 9 mg/dL (ref 6–20)
BUN: 8 mg/dL (ref 6–20)
CHLORIDE: 73 mmol/L — AB (ref 101–111)
CO2: 30 mmol/L (ref 22–32)
CO2: 29 mmol/L (ref 22–32)
CO2: 29 mmol/L (ref 22–32)
CO2: 31 mmol/L (ref 22–32)
CO2: 26 mmol/L (ref 22–32)
CREATININE: 0.67 mg/dL (ref 0.44–1.00)
CREATININE: 0.57 mg/dL (ref 0.44–1.00)
Calcium: 7.7 mg/dL — ABNORMAL LOW (ref 8.9–10.3)
Calcium: 7.3 mg/dL — ABNORMAL LOW (ref 8.9–10.3)
Calcium: 7.4 mg/dL — ABNORMAL LOW (ref 8.9–10.3)
Calcium: 7.4 mg/dL — ABNORMAL LOW (ref 8.9–10.3)
Calcium: 7.5 mg/dL — ABNORMAL LOW (ref 8.9–10.3)
Chloride: 75 mmol/L — ABNORMAL LOW (ref 101–111)
Chloride: 76 mmol/L — ABNORMAL LOW (ref 101–111)
Chloride: 77 mmol/L — ABNORMAL LOW (ref 101–111)
Chloride: 78 mmol/L — ABNORMAL LOW (ref 101–111)
Creatinine, Ser: 0.68 mg/dL (ref 0.44–1.00)
Creatinine, Ser: 0.58 mg/dL (ref 0.44–1.00)
Creatinine, Ser: 0.62 mg/dL (ref 0.44–1.00)
GFR calc Af Amer: >60 mL/min (ref >60)
GFR calc Af Amer: >60 mL/min (ref >60)
GFR calc Af Amer: >60 mL/min (ref >60)
GFR calc non Af Amer: >60 mL/min (ref >60)
GLUCOSE: 114 mg/dL — AB (ref 65–99)
Glucose, Bld: 118 mg/dL — ABNORMAL HIGH (ref 65–99)
Glucose, Bld: 122 mg/dL — ABNORMAL HIGH (ref 65–99)
Glucose, Bld: 123 mg/dL — ABNORMAL HIGH (ref 65–99)
Glucose, Bld: 153 mg/dL — ABNORMAL HIGH (ref 65–99)
POTASSIUM: 2 mmol/L — AB (ref 3.5–5.1)
POTASSIUM: 2 mmol/L — AB (ref 3.5–5.1)
Potassium: 2.5 mmol/L — CL (ref 3.5–5.1)
SODIUM: 117 mmol/L — AB (ref 135–145)
SODIUM: 118 mmol/L — AB (ref 135–145)
SODIUM: 115 mmol/L — AB (ref 135–145)
Sodium: 115 mmol/L — CL (ref 135–145)
Sodium: 116 mmol/L — CL (ref 135–145)

## 2016-07-22 LAB — TYPE AND SCREEN
ABO/RH(D): O POS
Antibody Screen: NEGATIVE

## 2016-07-22 LAB — CBC WITH DIFFERENTIAL/PLATELET
Basophils Absolute: 0 10*3/uL (ref 0.0–0.1)
Basophils Relative: 0 %
EOS ABS: 0.1 10*3/uL (ref 0.0–0.7)
EOS PCT: 0 %
HCT: 40.3 % (ref 36.0–46.0)
HEMOGLOBIN: 14.6 g/dL (ref 12.0–15.0)
LYMPHS ABS: 1.6 10*3/uL (ref 0.7–4.0)
LYMPHS PCT: 9 %
MCH: 30.6 pg (ref 26.0–34.0)
MCHC: 36.2 g/dL — ABNORMAL HIGH (ref 30.0–36.0)
MCV: 84.5 fL (ref 78.0–100.0)
Monocytes Absolute: 1.3 10*3/uL — ABNORMAL HIGH (ref 0.1–1.0)
Monocytes Relative: 8 %
NEUTROS PCT: 83 %
Neutro Abs: 13.9 10*3/uL — ABNORMAL HIGH (ref 1.7–7.7)
PLATELETS: 348 10*3/uL (ref 150–400)
RBC: 4.77 MIL/uL (ref 3.87–5.11)
RDW: 12.3 % (ref 11.5–15.5)
WBC: 16.8 10*3/uL — ABNORMAL HIGH (ref 4.0–10.5)

## 2016-07-22 LAB — I-STAT CG4 LACTIC ACID, ED
LACTIC ACID, VENOUS: 8.32 mmol/L — AB (ref 0.5–1.9)
Lactic Acid, Venous: 2.32 mmol/L (ref 0.5–1.9)

## 2016-07-22 LAB — POC OCCULT BLOOD, ED: Fecal Occult Bld: POSITIVE — AB

## 2016-07-22 LAB — I-STAT TROPONIN, ED: TROPONIN I, POC: 0.01 ng/mL (ref 0.00–0.08)

## 2016-07-22 LAB — PROTIME-INR
INR: 1.15
Prothrombin Time: 14.8 seconds (ref 11.4–15.2)

## 2016-07-22 LAB — MAGNESIUM: Magnesium: 1.2 mg/dL — ABNORMAL LOW (ref 1.7–2.4)

## 2016-07-22 LAB — TSH: TSH: 1.599 u[IU]/mL (ref 0.350–4.500)

## 2016-07-22 LAB — SODIUM, URINE, RANDOM: SODIUM UR: 52 mmol/L

## 2016-07-22 LAB — PHOSPHORUS: PHOSPHORUS: 2.3 mg/dL — AB (ref 2.5–4.6)

## 2016-07-22 LAB — OSMOLALITY: Osmolality: 235 mOsm/kg — CL (ref 275–295)

## 2016-07-22 LAB — LACTIC ACID, PLASMA: Lactic Acid, Venous: 2.2 mmol/L (ref 0.5–1.9)

## 2016-07-22 LAB — TROPONIN I

## 2016-07-22 MED ORDER — POTASSIUM CHLORIDE 10 MEQ/100ML IV SOLN
10.0000 meq | INTRAVENOUS | Status: AC
  Administered 2016-07-22 (×2): 10 meq via INTRAVENOUS
  Filled 2016-07-22 (×3): qty 100

## 2016-07-22 MED ORDER — LORAZEPAM 2 MG/ML IJ SOLN
INTRAMUSCULAR | Status: AC
  Filled 2016-07-22: qty 1

## 2016-07-22 MED ORDER — ALBUTEROL SULFATE (2.5 MG/3ML) 0.083% IN NEBU: 2.5000 mg | INHALATION_SOLUTION | Freq: Two times a day (BID) | RESPIRATORY_TRACT | Status: DC | PRN

## 2016-07-22 MED ORDER — MOMETASONE FURO-FORMOTEROL FUM 100-5 MCG/ACT IN AERO
2.0000 | INHALATION_SPRAY | Freq: Two times a day (BID) | RESPIRATORY_TRACT | Status: DC
  Filled 2016-07-22: qty 8.8

## 2016-07-22 MED ORDER — SODIUM CHLORIDE 3 % IV SOLN: INTRAVENOUS | Status: DC

## 2016-07-22 MED ORDER — ENOXAPARIN SODIUM 40 MG/0.4ML ~~LOC~~ SOLN: 40.0000 mg | SUBCUTANEOUS | Status: DC

## 2016-07-22 MED ORDER — POTASSIUM CHLORIDE 20 MEQ PO PACK: 40.0000 meq | PACK | Freq: Once | ORAL | Status: DC

## 2016-07-22 MED ORDER — LEVETIRACETAM IN NACL 1000 MG/100ML IV SOLN
1000.0000 mg | Freq: Once | INTRAVENOUS | Status: AC
  Administered 2016-07-22: 1000 mg via INTRAVENOUS
  Filled 2016-07-22: qty 100

## 2016-07-22 MED ORDER — ALBUTEROL SULFATE HFA 108 (90 BASE) MCG/ACT IN AERS
2.0000 | INHALATION_SPRAY | Freq: Two times a day (BID) | RESPIRATORY_TRACT | Status: DC | PRN
Start: 2016-07-22 — End: 2016-07-22

## 2016-07-22 MED ORDER — VANCOMYCIN HCL IN DEXTROSE 1-5 GM/200ML-% IV SOLN
1000.0000 mg | Freq: Once | INTRAVENOUS | Status: AC
  Administered 2016-07-22: 1000 mg via INTRAVENOUS
  Filled 2016-07-22: qty 200

## 2016-07-22 MED ORDER — MAGNESIUM SULFATE 2 GM/50ML IV SOLN
2.0000 g | Freq: Once | INTRAVENOUS | Status: AC
  Administered 2016-07-22: 2 g via INTRAVENOUS
  Filled 2016-07-22: qty 50

## 2016-07-22 MED ORDER — PIPERACILLIN-TAZOBACTAM 3.375 G IVPB
3.3750 g | Freq: Three times a day (TID) | INTRAVENOUS | Status: DC
  Administered 2016-07-22 – 2016-07-24 (×6): 3.375 g via INTRAVENOUS
  Filled 2016-07-22 (×6): qty 50

## 2016-07-22 MED ORDER — SODIUM CHLORIDE 0.9 % IV SOLN: 30.0000 meq | INTRAVENOUS | Status: DC

## 2016-07-22 MED ORDER — SODIUM CHLORIDE 0.9 % IV BOLUS (SEPSIS)
1000.0000 mL | Freq: Once | INTRAVENOUS | Status: AC
  Administered 2016-07-22: 1000 mL via INTRAVENOUS

## 2016-07-22 MED ORDER — SODIUM CHLORIDE 0.9 % IV BOLUS (SEPSIS)
2000.0000 mL | Freq: Once | INTRAVENOUS | Status: AC
  Administered 2016-07-22: 2000 mL via INTRAVENOUS

## 2016-07-22 MED ORDER — CALCITONIN (SALMON) 200 UNIT/ACT NA SOLN
1.0000 | Freq: Every day | NASAL | Status: DC
  Filled 2016-07-22: qty 3.7

## 2016-07-22 MED ORDER — LEVETIRACETAM IN NACL 500 MG/100ML IV SOLN
INTRAVENOUS | Status: AC
Start: 2016-07-22 — End: 2016-07-22
  Filled 2016-07-22: qty 100

## 2016-07-22 MED ORDER — HYDROMORPHONE HCL 2 MG PO TABS
2.0000 mg | ORAL_TABLET | ORAL | Status: DC | PRN
Start: 2016-07-22 — End: 2016-07-22

## 2016-07-22 MED ORDER — DEXTROSE 5 % IV SOLN
1.0000 g | Freq: Once | INTRAVENOUS | Status: AC
  Administered 2016-07-22: 1 g via INTRAVENOUS
  Filled 2016-07-22: qty 10

## 2016-07-22 MED ORDER — POTASSIUM CHLORIDE IN NACL 40-0.9 MEQ/L-% IV SOLN
INTRAVENOUS | Status: DC
  Administered 2016-07-22 (×2): 125 mL/h via INTRAVENOUS
  Administered 2016-07-23: 135 mL/h via INTRAVENOUS
  Administered 2016-07-24: 75 mL/h via INTRAVENOUS
  Administered 2016-07-24: 135 mL/h via INTRAVENOUS
  Administered 2016-07-25: 75 mL/h via INTRAVENOUS
  Filled 2016-07-22 (×3): qty 1000

## 2016-07-22 MED ORDER — LORAZEPAM 2 MG/ML IJ SOLN
1.0000 mg | INTRAMUSCULAR | Status: AC | PRN
Start: 2016-07-22 — End: 2016-07-23
  Administered 2016-07-23 (×2): 1 mg via INTRAVENOUS
  Filled 2016-07-22 (×2): qty 1

## 2016-07-22 MED ORDER — POTASSIUM CHLORIDE 10 MEQ/100ML IV SOLN
10.0000 meq | INTRAVENOUS | Status: AC
  Administered 2016-07-22 – 2016-07-23 (×6): 10 meq via INTRAVENOUS
  Filled 2016-07-22 (×6): qty 100

## 2016-07-22 MED ORDER — SODIUM CHLORIDE 0.9 % IV SOLN
500.0000 mg | Freq: Two times a day (BID) | INTRAVENOUS | Status: DC
  Administered 2016-07-22: 500 mg via INTRAVENOUS
  Filled 2016-07-22 (×2): qty 5

## 2016-07-22 MED ORDER — MOMETASONE FURO-FORMOTEROL FUM 200-5 MCG/ACT IN AERO
2.0000 | INHALATION_SPRAY | Freq: Two times a day (BID) | RESPIRATORY_TRACT | Status: DC
  Administered 2016-07-22: 2 via RESPIRATORY_TRACT
  Filled 2016-07-22: qty 8.8

## 2016-07-22 MED ORDER — HEPARIN BOLUS VIA INFUSION
4000.0000 [IU] | Freq: Once | INTRAVENOUS | Status: AC
  Administered 2016-07-22: 4000 [IU] via INTRAVENOUS
  Filled 2016-07-22: qty 4000

## 2016-07-22 MED ORDER — HEPARIN (PORCINE) IN NACL 100-0.45 UNIT/ML-% IJ SOLN
1100.0000 [IU]/h | INTRAMUSCULAR | Status: DC
  Administered 2016-07-22: 1100 [IU]/h via INTRAVENOUS
  Filled 2016-07-22: qty 250

## 2016-07-22 MED ORDER — CALCITONIN (SALMON) 200 UNIT/ACT NA SOLN
1.0000 | Freq: Every day | NASAL | Status: DC
  Administered 2016-07-24 – 2016-07-25 (×2): 1 via NASAL
  Filled 2016-07-22: qty 3.7

## 2016-07-22 MED ORDER — SODIUM CHLORIDE 3 % IV SOLN
INTRAVENOUS | Status: DC
  Administered 2016-07-23: 50 mL/h via INTRAVENOUS
  Filled 2016-07-22 (×2): qty 500

## 2016-07-22 MED ORDER — SODIUM CHLORIDE 3 % IV SOLN
INTRAVENOUS | Status: DC
  Filled 2016-07-22: qty 500

## 2016-07-22 MED ORDER — POTASSIUM CHLORIDE 10 MEQ/100ML IV SOLN
10.0000 meq | INTRAVENOUS | Status: AC
  Administered 2016-07-22 (×3): 10 meq via INTRAVENOUS
  Filled 2016-07-22 (×2): qty 100

## 2016-07-22 MED ORDER — LEVETIRACETAM 500 MG/5ML IV SOLN
500.0000 mg | Freq: Two times a day (BID) | INTRAVENOUS | Status: DC
  Filled 2016-07-22 (×2): qty 5

## 2016-07-22 MED ORDER — VANCOMYCIN HCL IN DEXTROSE 1-5 GM/200ML-% IV SOLN
1000.0000 mg | Freq: Two times a day (BID) | INTRAVENOUS | Status: DC
  Administered 2016-07-22 – 2016-07-24 (×4): 1000 mg via INTRAVENOUS
  Filled 2016-07-22 (×4): qty 200

## 2016-07-22 NOTE — H&P (Signed)
History and Physical    FLOYE FESLER WLN:[id number] DOB: April 01, 1933 DOA: 08/09/2016  PCP: Glo Herring, MD   Patient coming from: Home  Chief Complaint: Blood in urine.   HPI: Lisa Saunders is a 83 y.o. female with medical history significant of chronic back pain, HTN, paroxysmal A. Fib. Patient was brought to the ED today by EMS with complaints of blood in the urine. Most of the history is gotten from chat review, as patient is confused. Met patients daughter later, who is not able to tell me most of patient's medical problems, she is now free of heart failure diagnosis, or why patient's med list includes diuretics. Over the past 1-2 weeks patient has been weak, less active. Patient has pain on antibiotics Keflex for UTI, within the past month.   ED course- On arrival in the ED patient is initially Alert but not oriented, and then patient has a seizure in the ED. Vitals are stable, telemetry monitoring shows A. Fib. Blood work reveals- Na- 114, K <2, with normal Cr, WBC- 16.8, chest x-ray unremarkable, head CT negative for acute abnormality. Blood work was repeated- to confirm electrolytes- Na- 112, K- ,2. I Stat lactic acid- 8.32, after 3 L fluid bolus, Repeat- 3 hrs later- 2.3. Pt was started on vanc and ceftriaxone.   Review of Systems:  Could not be done fully due to patients mental status.  Past Medical History:  Diagnosis Date  . Anemia   . Anxiety   . Back pain   . Chronic cystitis   . Chronic pain   . Complication of anesthesia    pt states"I was in a coma for 5 days after my 3rd back surgery and no one knows why". husband states she hasnt been right since then,doesnt walk right or anything" Dr Durene Cal was MD  . COPD (chronic obstructive pulmonary disease) ([place])   . Depression   . Fibromyalgia   . Hypertension   . Mixed incontinence     Past Surgical History:  Procedure Laterality Date  . ABDOMINAL HYSTERECTOMY    . APPENDECTOMY    . back surgery x 4      . CARDIAC CATHETERIZATION  11/2002   w/o signficant  (Dr. Gerrie Nordmann)  . CATARACT EXTRACTION Right   . CATARACT EXTRACTION W/PHACO Left 07/19/2012   Procedure: CATARACT EXTRACTION PHACO AND INTRAOCULAR LENS PLACEMENT (IOC);  Surgeon: Williams Che, MD;  Location: [facility];  Service: Ophthalmology;  Laterality: Left;  CDE 12.60  . CHOLECYSTECTOMY    . COLONOSCOPY WITH PROPOFOL N/A 03/21/2013   Procedure: COLONOSCOPY WITH PROPOFOL;  Surgeon: Rogene Houston, MD;  Location: [facility];  Service: Endoscopy;  Laterality: N/A;  In cecum at 751, out at 0802 = 11 minutes total time  . KNEE SURGERY    . POLYPECTOMY N/A 03/21/2013   Procedure: POLYPECTOMY;  Surgeon: Rogene Houston, MD;  Location: [facility];  Service: Endoscopy;  Laterality: N/A;  . SPINAL CORD STIMULATOR IMPLANT    . TONSILLECTOMY    . TRANSTHORACIC ECHOCARDIOGRAM  11/16/2012   EF 74-08%, grade 2 diastolic dysfunction; LA severely dilated; mod TR;      reports that she quit smoking about 52 years ago. She has never used smokeless tobacco. She reports that she does not drink alcohol or use drugs.  Allergies  Allergen Reactions  . Morphine     COMA  . Prednisone     Makes me crazy and mean  . Procardia [Nifedipine] Other (See  Comments)    unknown  . Latex Rash    Family History  Problem Relation Age of Onset  . Heart failure Mother   . Heart attack Father     Prior to Admission medications   Medication Sig Start Date End Date Taking? Authorizing Provider  ADVAIR DISKUS 250-50 MCG/DOSE AEPB Inhale 1 puff into the lungs 2 (two) times daily.  06/09/11  Yes Historical Provider, MD  albuterol (PROVENTIL HFA;VENTOLIN HFA) 108 (90 BASE) MCG/ACT inhaler Inhale 2 puffs into the lungs 2 (two) times daily as needed for shortness of breath. For shortness of breath   Yes Historical Provider, MD  ALPRAZolam Duanne Moron) 1 MG tablet Take 1 mg by mouth 4 (four) times daily as needed for anxiety. For anxiety 06/25/11  Yes Historical Provider, MD   aspirin 81 MG tablet Take 81 mg by mouth daily.   Yes Historical Provider, MD  budesonide-formoterol (SYMBICORT) 80-4.5 MCG/ACT inhaler Inhale 2 puffs into the lungs 2 (two) times daily.   Yes Historical Provider, MD  calcitonin, salmon, (MIACALCIN/FORTICAL) 200 UNIT/ACT nasal spray Place 1 spray into the nose daily.   Yes Historical Provider, MD  Calcium Carbonate-Vitamin D (CALTRATE 600+D PO) Take 1 tablet by mouth daily.    Yes Historical Provider, MD  cephALEXin (KEFLEX) 500 MG capsule Take 1 capsule (500 mg total) by mouth 4 (four) times daily. 02/19/16  Yes Francine Graven, DO  cholecalciferol (VITAMIN D) 1000 UNITS tablet Take 1,000 Units by mouth daily.   Yes Historical Provider, MD  Cranberry 360 MG CAPS Take 300 mg by mouth daily.   Yes Historical Provider, MD  Docusate Calcium (STOOL SOFTENER PO) Take 100 mg by mouth 2 (two) times daily. 03/07/14  Yes Historical Provider, MD  ferrous sulfate 325 (65 FE) MG tablet Take 325 mg by mouth 2 (two) times daily after a meal.    Yes Historical Provider, MD  FLUoxetine (PROZAC) 20 MG tablet Take 20 mg by mouth daily.   Yes Historical Provider, MD  furosemide (LASIX) 40 MG tablet Take 40 mg by mouth 2 (two) times daily.  06/23/11  Yes Historical Provider, MD  Garlic 6381 MG CAPS Take 1 capsule by mouth daily.   Yes Historical Provider, MD  Incontinence Supply Disposable (BLADDER CONTROL PADS EX ABSORB) [place]  03/06/14  Yes Historical Provider, MD  KLOR-CON M20 20 MEQ tablet Take 20 mEq by mouth 2 (two) times daily.  04/29/11  Yes Historical Provider, MD  metolazone (ZAROXOLYN) 2.5 MG tablet Take 1 tablet by mouth daily. 06/23/16  Yes Historical Provider, MD  metoprolol (LOPRESSOR) 50 MG tablet Take 50 mg by mouth 2 (two) times daily.   Yes Historical Provider, MD  oxybutynin (DITROPAN) 5 MG tablet Take 5 mg by mouth 2 (two) times daily. Reported on 06/26/2015   Yes Historical Provider, MD  oxyCODONE (ROXICODONE) 15 MG immediate release tablet Take 1-2  tablets by mouth every 4 (four) hours. 05/22/16  Yes Historical Provider, MD  Oxycodone HCl 10 MG TABS Take 10 mg by mouth every 4 (four) hours as needed for severe pain. Take a max of 2 tablets per day with percocet for severe pain.    Yes Historical Provider, MD  risperiDONE (RISPERDAL) 0.5 MG tablet Take 1 tablet by mouth daily. 07/09/16  Yes Historical Provider, MD  traMADol (ULTRAM) 50 MG tablet Take 50 mg by mouth 4 (four) times daily.   Yes Historical Provider, MD  vitamin B-12 (CYANOCOBALAMIN) 1000 MCG tablet Take 1,000 mcg by mouth daily.  Yes Historical Provider, MD  zolpidem (AMBIEN) 10 MG tablet Take 10 mg by mouth at bedtime as needed for sleep.    Yes Historical Provider, MD  Multiple Vitamins-Minerals (HAIR SKIN AND NAILS FORMULA PO) Take 1 tablet by mouth 2 (two) times daily.     Historical Provider, MD  oxyCODONE-acetaminophen (PERCOCET) 10-325 MG tablet Take 1 tablet by mouth every 4 (four) hours as needed for pain.     Historical Provider, MD  pantoprazole (PROTONIX) 40 MG tablet TAKE ONE TABLET BY MOUTH ONCE DAILY BEFORE SUPPER. Patient not taking: Reported on 07/21/2016 11/08/15   Rogene Houston, MD  psyllium (REGULOID) 0.52 g capsule Take 1.04 g by mouth daily.    Historical Provider, MD    Physical Exam: Vitals:   08/08/2016 1545 08/04/2016 1600 08/18/2016 1658 08/05/2016 1700  BP:  119/63  119/77  Pulse: 87   (!) 113  Resp: 19 (!) 32  (!) 24  Temp:   97.7 F (36.5 C)   TempSrc:   Oral   SpO2: 98%   97%  Weight:    75.4 kg (166 lb 3.6 oz)  Height:    _0  (1.626 m)    Constitutional: NAD, calm, comfortable Vitals:   07/28/2016 1545 08/18/2016 1600 07/28/2016 1658 07/29/2016 1700  BP:  119/63  119/77  Pulse: 87   (!) 113  Resp: 19 (!) 32  (!) 24  Temp:   97.7 F (36.5 C)   TempSrc:   Oral   SpO2: 98%   97%  Weight:    75.4 kg (166 lb 3.6 oz)  Height:    _1  (1.626 m)   Eyes: PERRL, lids and conjunctivae normal, calm, resting comfortably, not approprtaitely answering  questions. ENMT: Mucous membranes are moist. Posterior pharynx not seen due to pts habitus  Neck: normal, supple, no masses, no thyromegaly Respiratory: Crackles bilat base, Normal respiratory effort. No accessory muscle use, no wheeze Cardiovascular: Irregular rate and rhythm, no murmurs / rubs / gallops. No extremity edema.  Abdomen: no tenderness, no masses palpated. No hepatosplenomegaly. Bowel sounds positive. Foley cath. Musculoskeletal: no clubbing / cyanosis. No joint deformity upper and lower extremities. Good ROM, no contractures. Normal muscle tone.  Skin: no rashes, lesions, ulcers. No induration Neurologic: moving all extremities, Facial symmetry, PERRL, confusion limits full neuro exam.,  Psychiatric: Confused.  Labs on Admission: I have personally reviewed following labs and imaging studies  CBC:  Recent Labs Lab 08/17/2016 1024  WBC 16.8*  NEUTROABS 13.9*  HGB 14.6  HCT 40.3  MCV 84.5  PLT 412   Basic Metabolic Panel:  Recent Labs Lab 08/08/2016 1024 08/03/2016 1133 07/25/2016 1341 07/30/2016 1524  NA 114* 112* 115* 116*  K <2.0* <2.0* <2.0* 2.0*  CL <65* 69* 73* 75*  CO2 _2 GLUCOSE 118* 126* 114* 118*  BUN _3 CREATININE 0.84 0.73 0.68 0.58  CALCIUM 8.6* 7.5* 7.7* 7.3*  MG  --   --  1.2*  --    Liver Function Tests:  Recent Labs Lab 07/21/2016 1024 08/12/2016 1133  AST 47* 34  ALT 26 21  ALKPHOS 79 62  BILITOT 1.4* 0.8  PROT 7.3 5.9*  ALBUMIN 3.8 3.0*   Coagulation Profile:  Recent Labs Lab 07/20/2016 1024  INR 1.15   Thyroid Function Tests:  Recent Labs  07/25/2016 1024  TSH 1.599   Urine analysis:    Component Value Date/Time   COLORURINE YELLOW 08/09/2016 1231  APPEARANCEUR CLEAR 08/02/2016 1231   LABSPEC 1.008 08/11/2016 1231   PHURINE 7.0 08/16/2016 1231   GLUCOSEU NEGATIVE 07/24/2016 1231   HGBUR NEGATIVE 08/15/2016 1231   BILIRUBINUR NEGATIVE 08/17/2016 1231   KETONESUR NEGATIVE 07/23/2016 1231   PROTEINUR  NEGATIVE 08/07/2016 1231   UROBILINOGEN 0.2 07/19/2011 0859   NITRITE NEGATIVE 08/07/2016 1231   LEUKOCYTESUR NEGATIVE 08/09/2016 1231    Radiological Exams on Admission: Ct Head Wo Contrast  Result Date: 07/31/2016 CLINICAL DATA:  83 year old hypertensive female less active for the past week. Possible seizure. Initial encounter. EXAM: CT HEAD WITHOUT CONTRAST TECHNIQUE: Contiguous axial images were obtained from the base of the skull through the vertex without intravenous contrast. COMPARISON:  09/25/2010. FINDINGS: Brain: No intracranial hemorrhage or CT evidence of large acute infarct. Moderate chronic microvascular changes. Global atrophy without hydrocephalus. No intracranial mass lesion noted on this unenhanced exam. Vascular: Vascular calcifications. Skull: No acute abnormality. Sinuses/Orbits: Post lens replacement without acute orbital abnormality. Complete opacification left maxillary sinus. Other: Negative. IMPRESSION: No intracranial hemorrhage or CT evidence of large acute infarct. Moderate chronic microvascular changes. Global atrophy. Complete opacification left maxillary sinus. Electronically Signed   By: Genia Del M.D.   On: 08/07/2016 12:32   Dg Chest Portable 1 View  Result Date: 07/30/2016 CLINICAL DATA:  Sepsis.  Altered mental status.  COPD. EXAM: PORTABLE CHEST 1 VIEW COMPARISON:  Chest x-ray dated 07/10/2010 FINDINGS: Heart size is within normal limits. Prominent left pericardial fat pad. Pulmonary vascularity is normal. Interstitial markings are accentuated due to a shallow inspiration. No acute infiltrates or effusions. Spinal cord stimulator in place. No acute bone abnormality. Large hiatal hernia. Calcification in the arch of the aorta. IMPRESSION: No acute abnormalities.  Large hiatal hernia. Aortic atherosclerosis. Electronically Signed   By: Lorriane Shire M.D.   On: 08/17/2016 10:59   EKG: Independently reviewed. Afib, prolonged QTc 512.  Assessment/Plan Principal  Problem:   Hyponatremia Active Problems:   Hypertension   Back pain   PAF (paroxysmal atrial fibrillation) (HCC)   Lactic acidosis   Hypokalemia  Severe Hyponatremia- 114, with hypokalemia- <2, hypomag 1.2- Multiple electrolyte abnormality. Pt already got 3L bolus IVF in the ED. Differential- SIADH, diuretic induced. I was Concerned this was symptomatic - with seizures and confusion, requiring aggressive intervention with 3% saline given emergently to reduce risk of herniation. Initially Ordered 3% saline which would need central line access. I talked to ED provider who saw pt, Dr. Roderic Palau about need for a central line, he said he would see what he could do.  - Consulted nephrology- Spoke with Dr. Lowanda Foster.  Recs appreciated- diuretic especially thiazides most likely the cause of pts hyponatremia, considering pts marked low potassium also. He doubted SIADH. He recommended IVF N/S + 40KCL, 125cc/hr. He said 35 saline is unpredictable and could rapidly increase pts sodium. He suggested close monitoring of electrolytes every hour/2hrs. Increase Na to 120 in the next few hours.  - BMP Q2H, Mag Am - Goal Na- 120  - N/s + 40 KCL 125cc/hr - Urine Na, results might be skewed by diuretic use. - TSH- 1.5 - Urine osmolality pending - serum Osmolality pending - Am cortisol - IV mag 2g x1  Leukocytosis-16.8 with Lactic acidosis- 8.2 - with normal Cr, Stable vitals. Possibly sepsis, ?focus, with clear chest xray- no symptoms referrable to PNA, Clean UA - urine cultures already ordered - Blood cultures X2 - Will treat with IV vanc and IV zosyn for possible bacteremia as etiology -  Recheck Lactic acid  Afib- hx of paroxysmal atria fib. I- stat- neg trop. Pt was seen by Dr. Debara Pickett 2014, for paroxysmal atria fib, and was started on warfarin, later d/c for blood in bowel movements. - Trops x3 - Heparin gtt initially started and then d/c, as pts nurse notes 2 mucoid ?blood stains on pts bedding. - Pt rate  controlled, will hold off on anticoag at this time till Gi eval.  Hx of rectal bleed and heme + stools- Family reports blood in urine, but UA negative for blood. FOBT +. ? Blood from genital tract? - GI consult, was previously scheduled for outpt eval by Dr. Laural Golden. - CBC am  Asthma- cont home bronchodils.  Seizures- ? Related to hyponatremia. Nephrology feels lower Na levels will be more consistent with seizures. - IV keppra load in ED, Cont 567m BID - NPO due to metal status, seizures. - Ativan 179mPRN. - neurology consult - EEG.  Polypharmacy- Home meds- list lasix and metolazone, daughter does not know if pt has a  Heart failure diagnosis, not aware that patient is taking this meds - would explain mutiple electrotyle abnormality. Echo- 2014- EF- 55-60%, G2DD. - Chronic back pain, oxycodone, tramadol on hold for now while NPO - ? Risperidone, Fluoxetine, xanax- held.   DVT prophylaxis: SCDs  Code Status: Full  Family Communication: Daughter  Disposition Plan: To be determined,  Consults called: GI Admission status: ICU   EjBethena RoysD [facility] Pager [phone] 7PM-7AM, please contact night-coverage [email or web address] Password [email or web address] LLC4/06/2016, 6:15 PM

## 2016-07-22 NOTE — Progress Notes (Signed)
1635 Patient arrived to ICU from ED and started having a seizure upon arrival that lasted aprox 66min 20 secs. Patient able to verify her name but very lethargic at this time.

## 2016-07-22 NOTE — ED Notes (Signed)
Lab called for stat redraw due to results.

## 2016-07-22 NOTE — Progress Notes (Signed)
Spoke with family members at length to relieve concerns about care of patient and current treatment for signs and symptoms of patient. Alerted [facility] of current status and followed treatment as prescribed by [facility] physician. Family is now apprised of all happenings and path for treatment after speaking with both me and the current [facility] physician. Family is alleviated of concerns at this point and normal status is resumed by assigned staff.

## 2016-07-22 NOTE — ED Notes (Signed)
Called [facility].  Spoke with coordinator who states"Kelly will be here about 4pm.  Nurse informed.

## 2016-07-22 NOTE — ED Triage Notes (Signed)
Pt has been on abx x 2 this past month for UTI. Pt famiy called EMS for blood in urine. Pt alert upon ems arrival to home but no oriented. Family states has been much less active over the past week. Pt arrived to ED seizing. edp at bedside.

## 2016-07-22 NOTE — Progress Notes (Signed)
Pharmacy Antibiotic Note  Lisa Saunders is a 83 y.o. female admitted on 07/20/2016 with sepsis.  Pharmacy has been consulted for [place] dosing.  Plan:  Vancomycin 1000mg  IV q12h Check trough at steady state Zosyn 3.375gm IV q8h, EID Monitor labs, renal fxn, progress and c/s Deescalate ABX when improved / appropriate.    Height: 5\' 4"  (162.6 cm) Weight: 166 lb 3.6 oz (75.4 kg) IBW/kg (Calculated) : 54.7  Temp (24hrs), Avg:97.7 F (36.5 C), Min:97.7 F (36.5 C), Max:97.7 F (36.5 C)   Recent Labs Lab 08/05/2016 1024 07/30/2016 1036 07/21/2016 1133 07/25/2016 1314 07/21/2016 1341 07/21/2016 1524  WBC 16.8*  --   --   --   --   --   CREATININE 0.84  --  0.73  --  0.68 0.58  LATICACIDVEN  --  8.32*  --  2.32*  --   --     Estimated Creatinine Clearance: 54.9 mL/min (by C-G formula based on SCr of 0.58 mg/dL).    Allergies  Allergen Reactions  . Morphine     COMA  . Prednisone     Makes me crazy and mean  . Procardia [Nifedipine] Other (See Comments)    unknown  . Latex Rash   Antimicrobials this admission: Vancomycin 4/3 >>  Zosyn 4/3 >>   Dose adjustments this admission:  Microbiology results:  BCx: pending  UCx: pending   Sputum: pending   MRSA PCR: pending  Thank you for allowing pharmacy to be a part of this patient's care.  Hart Robinsons A 08/14/2016 7:05 PM

## 2016-07-22 NOTE — ED Notes (Signed)
Date and time results received: 08/05/2016 1628 (use smartphrase ".now" to insert current time)  Test: Sodium 116           Potassium 2.0 Critical Value:  Name of Provider Notified: Emockpae  Orders Received? Or Actions Taken?: Paged

## 2016-07-22 NOTE — Progress Notes (Signed)
CRITICAL VALUE ALERT  Critical value received:  Na+ 118, K+ 2.0, Lactic acid 2.2  Date of notification:  08/01/2016  Time of notification: 2015  Critical value read back:Yes  Nurse who received alert:Raena Pau,RN  MD notified (1st page): Yes, DR.  Time of first page:  2025  MD notified (2nd page):  Time of second page:  Responding MD:   Time MD responded:

## 2016-07-22 NOTE — ED Notes (Signed)
CRITICAL VALUE ALERT  Critical value received:  Sodium 115, K+ <2.0  Date of notification:  08/09/2016  Time of notification:  4695  Critical value read back:Yes.    Nurse who received alert:  Laurell Josephs RN  MD notified (1st page):  Emokpae  Time of first page:  65  MD notified (2nd page):  Time of second page:  Responding MD:  Denton Brick  Time MD responded:  1435

## 2016-07-22 NOTE — Progress Notes (Signed)
ANTICOAGULATION CONSULT NOTE - Initial Consult  Pharmacy Consult for HEPARIN Indication: atrial fibrillation  Allergies  Allergen Reactions  . Morphine     COMA  . Prednisone     Makes me crazy and mean  . Procardia [Nifedipine] Other (See Comments)    unknown  . Latex Rash    Patient Measurements: Height: 5\' 4"  (162.6 cm) Weight: 166 lb 3.6 oz (75.4 kg) IBW/kg (Calculated) : 54.7 HEPARIN DW (KG): 70.5  Vital Signs: Temp: 97.7 F (36.5 C) (04/03 1658) Temp Source: Oral (04/03 1658) BP: 119/77 (04/03 1700) Pulse Rate: 113 (04/03 1700)  Labs:  Recent Labs  08/09/2016 1024 08/13/2016 1133 08/01/2016 1341 07/21/2016 1524  HGB 14.6  --   --   --   HCT 40.3  --   --   --   PLT 348  --   --   --   LABPROT 14.8  --   --   --   INR 1.15  --   --   --   CREATININE 0.84 0.73 0.68 0.58    Estimated Creatinine Clearance: 54.9 mL/min (by C-G formula based on SCr of 0.58 mg/dL).   Medical History: Past Medical History:  Diagnosis Date  . Anemia   . Anxiety   . Back pain   . Chronic cystitis   . Chronic pain   . Complication of anesthesia    pt states"I was in a coma for 5 days after my 3rd back surgery and no one knows why". husband states she hasnt been right since then,doesnt walk right or anything" Dr Durene Cal was MD  . COPD (chronic obstructive pulmonary disease) ([place])   . Depression   . Fibromyalgia   . Hypertension   . Mixed incontinence     Medications:  Prescriptions Prior to Admission  Medication Sig Dispense Refill Last Dose  . ADVAIR DISKUS 250-50 MCG/DOSE AEPB Inhale 1 puff into the lungs 2 (two) times daily.    02/18/2016 at Unknown time  . albuterol (PROVENTIL HFA;VENTOLIN HFA) 108 (90 BASE) MCG/ACT inhaler Inhale 2 puffs into the lungs 2 (two) times daily as needed for shortness of breath. For shortness of breath   unknown  . ALPRAZolam (XANAX) 1 MG tablet Take 1 mg by mouth 4 (four) times daily as needed for anxiety. For anxiety   07/21/2016 at Unknown  time  . aspirin 81 MG tablet Take 81 mg by mouth daily.   02/19/2016 at 0800  . budesonide-formoterol (SYMBICORT) 80-4.5 MCG/ACT inhaler Inhale 2 puffs into the lungs 2 (two) times daily.   02/18/2016 at Unknown time  . calcitonin, salmon, (MIACALCIN/FORTICAL) 200 UNIT/ACT nasal spray Place 1 spray into the nose daily.   02/18/2016 at Unknown time  . Calcium Carbonate-Vitamin D (CALTRATE 600+D PO) Take 1 tablet by mouth daily.    02/19/2016 at Unknown time  . cephALEXin (KEFLEX) 500 MG capsule Take 1 capsule (500 mg total) by mouth 4 (four) times daily. 40 capsule 0 Past Week at Unknown time  . cholecalciferol (VITAMIN D) 1000 UNITS tablet Take 1,000 Units by mouth daily.   02/19/2016 at Unknown time  . Cranberry 360 MG CAPS Take 300 mg by mouth daily.   02/18/2016 at Unknown time  . Docusate Calcium (STOOL SOFTENER PO) Take 100 mg by mouth 2 (two) times daily.   02/19/2016 at Unknown time  . ferrous sulfate 325 (65 FE) MG tablet Take 325 mg by mouth 2 (two) times daily after a meal.    02/19/2016  at Unknown time  . FLUoxetine (PROZAC) 20 MG tablet Take 20 mg by mouth daily.   07/21/2016 at Unknown time  . furosemide (LASIX) 40 MG tablet Take 40 mg by mouth 2 (two) times daily.    07/21/2016 at Unknown time  . Garlic 0165 MG CAPS Take 1 capsule by mouth daily.   02/19/2016 at Unknown time  . Incontinence Supply Disposable (BLADDER CONTROL PADS EX ABSORB) MISC    Taking  . KLOR-CON M20 20 MEQ tablet Take 20 mEq by mouth 2 (two) times daily.    07/21/2016 at Unknown time  . metolazone (ZAROXOLYN) 2.5 MG tablet Take 1 tablet by mouth daily.   07/21/2016 at Unknown time  . metoprolol (LOPRESSOR) 50 MG tablet Take 50 mg by mouth 2 (two) times daily.   07/21/2016 at 2200  . oxybutynin (DITROPAN) 5 MG tablet Take 5 mg by mouth 2 (two) times daily. Reported on 06/26/2015   07/21/2016 at Unknown time  . oxyCODONE (ROXICODONE) 15 MG immediate release tablet Take 1-2 tablets by mouth every 4 (four) hours.   07/21/2016 at  Unknown time  . Oxycodone HCl 10 MG TABS Take 10 mg by mouth every 4 (four) hours as needed for severe pain. Take a max of 2 tablets per day with percocet for severe pain.    07/21/2016 at Unknown time  . risperiDONE (RISPERDAL) 0.5 MG tablet Take 1 tablet by mouth daily.   07/21/2016 at Unknown time  . traMADol (ULTRAM) 50 MG tablet Take 50 mg by mouth 4 (four) times daily.   07/21/2016 at Unknown time  . vitamin B-12 (CYANOCOBALAMIN) 1000 MCG tablet Take 1,000 mcg by mouth daily.   02/19/2016 at Unknown time  . zolpidem (AMBIEN) 10 MG tablet Take 10 mg by mouth at bedtime as needed for sleep.    07/21/2016 at Unknown time  . Multiple Vitamins-Minerals (HAIR SKIN AND NAILS FORMULA PO) Take 1 tablet by mouth 2 (two) times daily.    02/19/2016 at Unknown time  . oxyCODONE-acetaminophen (PERCOCET) 10-325 MG tablet Take 1 tablet by mouth every 4 (four) hours as needed for pain.    Not Taking at Unknown time  . pantoprazole (PROTONIX) 40 MG tablet TAKE ONE TABLET BY MOUTH ONCE DAILY BEFORE SUPPER. (Patient not taking: Reported on 08/11/2016) 30 tablet 5 Not Taking at Unknown time  . psyllium (REGULOID) 0.52 g capsule Take 1.04 g by mouth daily.   Not Taking at Unknown time    Assessment: 83yo female with afib .  Asked to initiate Heparin. Goal of Therapy:  Heparin level 0.3-0.7 units/ml Monitor platelets by anticoagulation protocol: Yes   Plan:   Heparin 4000 units IV now x 1  Heparin infusion at 1100 units/hr  Heparin level in 6-8 hrs then daily  CBC daily while on Heparin   Hart Robinsons A 07/21/2016,6:24 PM

## 2016-07-22 NOTE — Progress Notes (Signed)
1845 Patient observed having 23min 30sec seizure. Vss. Son at the bedside.

## 2016-07-22 NOTE — Progress Notes (Signed)
Witnessed pt having seizure duration was 1 min 20 secs. Family at bedside. MD paged and made aware.  Ericka Pontiff, RN 07/23/2016 9:03 PM

## 2016-07-22 NOTE — ED Triage Notes (Signed)
bs in route 134

## 2016-07-22 NOTE — Progress Notes (Signed)
Spoke with Dr.Sommers (eLink) and new orders placed. Dr.Sommer's instructed to give scheduled Keppra IV now.

## 2016-07-22 NOTE — ED Notes (Signed)
CRITICAL VALUE ALERT  Critical value received:  Sodium 112, Potassium < 2   Date of notification:  07/28/2016  Time of notification:  1207  Critical value read back:Yes.    Nurse who received alert:  Norm Salt, RN  MD notified (1st page):  Dr. Roderic Palau  Time of first page:  1208  MD notified (2nd page):  Time of second page:  Responding MD:  Dr. Roderic Palau  Time MD responded:  1208

## 2016-07-22 NOTE — Progress Notes (Signed)
[facility] Progress Note Patient Name: SKYRAH KRUPP DOB: 1932/06/05 MRN: [id number]   Date of Service  08/10/2016  HPI/Events of Note  K+ < 2.0 and Creatinine = 0.67.  eICU Interventions  Will order:  1. Replace K+.      Intervention Category Major Interventions: Electrolyte abnormality - evaluation and management  Karel Turpen Eugene 08/17/2016, 8:25 PM

## 2016-07-22 NOTE — ED Notes (Signed)
Date and time results received: 08/07/2016 1108 (use smartphrase ".now" to insert current time)  Test: Na Critical Value: 114 Test: K+ Critical Value: <2 Test: Chloride Critical Value: <65  Name of Provider Notified: Dr. Roderic Palau  Orders Received? Or Actions Taken?: Provider ordered redraw of labs.

## 2016-07-22 NOTE — ED Notes (Signed)
Pt seized approx 1-2 minutes. Pt now post ictal. Eyes open at times. Moans at times. asked her to look my way and she did. Pt more alert at this time

## 2016-07-22 NOTE — Progress Notes (Signed)
Seizure precautions initiated, bed rail pads applied, oxygen & suction set-up at bedside.

## 2016-07-22 NOTE — ED Notes (Signed)
As request by Gaspar Bidding, RN from Dr. Denton Brick, [facility] was called to cancel order for [facility] LINE.

## 2016-07-22 NOTE — ED Provider Notes (Signed)
[facility] DEPT Provider Note   CSN: [id number] Arrival date & time: 08/11/2016  1017   By signing my name below, I, Avnee Patel, attest that this documentation has been prepared under the direction and in the presence of Milton Ferguson, MD  Electronically Signed: Delton Prairie, ED Scribe. 08/13/2016. 12:38 PM.   History   Chief Complaint Chief Complaint  Patient presents with  . Hematuria    HPI Comments:  Lisa Saunders is a 83 y.o. female who presents to the Emergency Department, via EMS, complaining of acute onset hematuria x today. Per EMS, the family has noted the pt has been less active this past week. Pt has also been taking antibiotics for a UTI x 1 month. Per EMS, the pt began to exhibit seizure-like activity upon arrival to the ED. No alleviating or modifying factors noted. Pt denies any other associated symptoms. No other complaints noted.    According to family the patient has been a little confused for the last couple days and having rectal bleeding   The history is provided by the patient. No language interpreter was used.  Illness  This is a new problem. The current episode started more than 2 days ago. The problem occurs constantly. The problem has not changed since onset.Pertinent negatives include no chest pain. Nothing aggravates the symptoms. She has tried nothing for the symptoms.    Past Medical History:  Diagnosis Date  . Anemia   . Anxiety   . Back pain   . Chronic cystitis   . Chronic pain   . Complication of anesthesia    pt states"I was in a coma for 5 days after my 3rd back surgery and no one knows why". husband states she hasnt been right since then,doesnt walk right or anything" Dr Durene Cal was MD  . COPD (chronic obstructive pulmonary disease) ([place])   . Depression   . Fibromyalgia   . Hypertension   . Mixed incontinence     Patient Active Problem List   Diagnosis Date Noted  . GERD (gastroesophageal reflux disease) 12/18/2014  .  Weight loss 12/18/2014  . PAF (paroxysmal atrial fibrillation) ([place]) 03/09/2013  . Heme positive stool 03/08/2013  . Leg edema 10/26/2012  . Constipation 03/24/2012  . Hypertension 07/08/2011  . Back pain 07/08/2011  . Muscle weakness (generalized) 01/13/2011  . Abnormality of gait 01/13/2011  . Difficulty in walking(719.7) 01/13/2011    Past Surgical History:  Procedure Laterality Date  . ABDOMINAL HYSTERECTOMY    . APPENDECTOMY    . back surgery x 4    . CARDIAC CATHETERIZATION  11/2002   w/o signficant  (Dr. Gerrie Nordmann)  . CATARACT EXTRACTION Right   . CATARACT EXTRACTION W/PHACO Left 07/19/2012   Procedure: CATARACT EXTRACTION PHACO AND INTRAOCULAR LENS PLACEMENT (IOC);  Surgeon: Williams Che, MD;  Location: [facility]S;  Service: Ophthalmology;  Laterality: Left;  CDE 12.60  . CHOLECYSTECTOMY    . COLONOSCOPY WITH PROPOFOL N/A 03/21/2013   Procedure: COLONOSCOPY WITH PROPOFOL;  Surgeon: Rogene Houston, MD;  Location: [facility] ORS;  Service: Endoscopy;  Laterality: N/A;  In cecum at 751, out at 0802 = 11 minutes total time  . KNEE SURGERY    . POLYPECTOMY N/A 03/21/2013   Procedure: POLYPECTOMY;  Surgeon: Rogene Houston, MD;  Location: [facility];  Service: Endoscopy;  Laterality: N/A;  . SPINAL CORD STIMULATOR IMPLANT    . TONSILLECTOMY    . TRANSTHORACIC ECHOCARDIOGRAM  11/16/2012   EF 55-60%, grade 2  diastolic dysfunction; LA severely dilated; mod TR;     OB History    No data available       Home Medications    Prior to Admission medications   Medication Sig Start Date End Date Taking? Authorizing Provider  ADVAIR DISKUS 250-50 MCG/DOSE AEPB Inhale 1 puff into the lungs 2 (two) times daily.  06/09/11  Yes Historical Provider, MD  albuterol (PROVENTIL HFA;VENTOLIN HFA) 108 (90 BASE) MCG/ACT inhaler Inhale 2 puffs into the lungs 2 (two) times daily as needed for shortness of breath. For shortness of breath   Yes Historical Provider, MD  ALPRAZolam Duanne Moron) 1 MG tablet Take 1 mg by  mouth 4 (four) times daily as needed for anxiety. For anxiety 06/25/11  Yes Historical Provider, MD  aspirin 81 MG tablet Take 81 mg by mouth daily.   Yes Historical Provider, MD  budesonide-formoterol (SYMBICORT) 80-4.5 MCG/ACT inhaler Inhale 2 puffs into the lungs 2 (two) times daily.   Yes Historical Provider, MD  calcitonin, salmon, (MIACALCIN/FORTICAL) 200 UNIT/ACT nasal spray Place 1 spray into the nose daily.   Yes Historical Provider, MD  Calcium Carbonate-Vitamin D (CALTRATE 600+D PO) Take 1 tablet by mouth daily.    Yes Historical Provider, MD  cephALEXin (KEFLEX) 500 MG capsule Take 1 capsule (500 mg total) by mouth 4 (four) times daily. 02/19/16  Yes Francine Graven, DO  cholecalciferol (VITAMIN D) 1000 UNITS tablet Take 1,000 Units by mouth daily.   Yes Historical Provider, MD  Cranberry 360 MG CAPS Take 300 mg by mouth daily.   Yes Historical Provider, MD  Docusate Calcium (STOOL SOFTENER PO) Take 100 mg by mouth 2 (two) times daily. 03/07/14  Yes Historical Provider, MD  ferrous sulfate 325 (65 FE) MG tablet Take 325 mg by mouth 2 (two) times daily after a meal.    Yes Historical Provider, MD  FLUoxetine (PROZAC) 20 MG tablet Take 20 mg by mouth daily.   Yes Historical Provider, MD  furosemide (LASIX) 40 MG tablet Take 40 mg by mouth 2 (two) times daily.  06/23/11  Yes Historical Provider, MD  Garlic 8676 MG CAPS Take 1 capsule by mouth daily.   Yes Historical Provider, MD  Incontinence Supply Disposable (BLADDER CONTROL PADS EX ABSORB) [place]  03/06/14  Yes Historical Provider, MD  KLOR-CON M20 20 MEQ tablet Take 20 mEq by mouth 2 (two) times daily.  04/29/11  Yes Historical Provider, MD  metolazone (ZAROXOLYN) 2.5 MG tablet Take 1 tablet by mouth daily. 06/23/16  Yes Historical Provider, MD  metoprolol (LOPRESSOR) 50 MG tablet Take 50 mg by mouth 2 (two) times daily.   Yes Historical Provider, MD  oxybutynin (DITROPAN) 5 MG tablet Take 5 mg by mouth 2 (two) times daily. Reported on 06/26/2015    Yes Historical Provider, MD  oxyCODONE (ROXICODONE) 15 MG immediate release tablet Take 1-2 tablets by mouth every 4 (four) hours. 05/22/16  Yes Historical Provider, MD  Oxycodone HCl 10 MG TABS Take 10 mg by mouth every 4 (four) hours as needed for severe pain. Take a max of 2 tablets per day with percocet for severe pain.    Yes Historical Provider, MD  risperiDONE (RISPERDAL) 0.5 MG tablet Take 1 tablet by mouth daily. 07/09/16  Yes Historical Provider, MD  traMADol (ULTRAM) 50 MG tablet Take 50 mg by mouth 4 (four) times daily.   Yes Historical Provider, MD  vitamin B-12 (CYANOCOBALAMIN) 1000 MCG tablet Take 1,000 mcg by mouth daily.   Yes Historical Provider,  MD  zolpidem (AMBIEN) 10 MG tablet Take 10 mg by mouth at bedtime as needed for sleep.    Yes Historical Provider, MD  Multiple Vitamins-Minerals (HAIR SKIN AND NAILS FORMULA PO) Take 1 tablet by mouth 2 (two) times daily.     Historical Provider, MD  oxyCODONE-acetaminophen (PERCOCET) 10-325 MG tablet Take 1 tablet by mouth every 4 (four) hours as needed for pain.     Historical Provider, MD  pantoprazole (PROTONIX) 40 MG tablet TAKE ONE TABLET BY MOUTH ONCE DAILY BEFORE SUPPER. Patient not taking: Reported on 07/21/2016 11/08/15   Rogene Houston, MD  psyllium (REGULOID) 0.52 g capsule Take 1.04 g by mouth daily.    Historical Provider, MD    Family History Family History  Problem Relation Age of Onset  . Heart failure Mother   . Heart attack Father     Social History Social History  Substance Use Topics  . Smoking status: Former Smoker    Quit date: 04/21/1964  . Smokeless tobacco: Never Used  . Alcohol use No     Allergies   Morphine; Prednisone; Procardia [nifedipine]; and Latex   Review of Systems Review of Systems  Constitutional: Negative for chills.  Cardiovascular: Negative for chest pain.  Genitourinary: Positive for hematuria.  All other systems reviewed and are negative.    Physical Exam Updated Vital  Signs BP 120/62   Pulse 83   Resp 14   SpO2 97%   Physical Exam  Constitutional: She appears well-developed.  HENT:  Head: Normocephalic.  Eyes: Conjunctivae and EOM are normal. No scleral icterus.  Neck: Neck supple. No thyromegaly present.  Cardiovascular: Exam reveals no gallop and no friction rub.   No murmur heard. Normal rate and irregular rhythm  Pulmonary/Chest: No stridor. She has no wheezes. She has no rales. She exhibits no tenderness.  Abdominal: She exhibits no distension. There is no tenderness. There is no rebound.  Genitourinary:  Genitourinary Comments: Gross blood on rectal exam   Musculoskeletal: Normal range of motion. She exhibits no edema.  Lymphadenopathy:    She has no cervical adenopathy.  Neurological: She exhibits normal muscle tone. Coordination normal.  Patient postictal from seizure unable to answer questions  Skin: No rash noted. No erythema.  Nursing note and vitals reviewed.    ED Treatments / Results  DIAGNOSTIC STUDIES:  Oxygen Saturation is 97% on RA, normal by my interpretation.    COORDINATION OF CARE:  12:28 PM Discussed treatment plan with pt at bedside and pt agreed to plan.  Labs (all labs ordered are listed, but only abnormal results are displayed) Labs Reviewed  COMPREHENSIVE METABOLIC PANEL - Abnormal; Notable for the following:       Result Value   Sodium 114 (*)    Potassium <2.0 (*)    Chloride <65 (*)    Glucose, Bld 118 (*)    Calcium 8.6 (*)    AST 47 (*)    Total Bilirubin 1.4 (*)    All other components within normal limits  CBC WITH DIFFERENTIAL/PLATELET - Abnormal; Notable for the following:    WBC 16.8 (*)    MCHC 36.2 (*)    Neutro Abs 13.9 (*)    Monocytes Absolute 1.3 (*)    All other components within normal limits  COMPREHENSIVE METABOLIC PANEL - Abnormal; Notable for the following:    Sodium 112 (*)    Potassium <2.0 (*)    Chloride 69 (*)    Glucose, Bld 126 (*)  Calcium 7.5 (*)    Total  Protein 5.9 (*)    Albumin 3.0 (*)    All other components within normal limits  I-STAT CG4 LACTIC ACID, ED - Abnormal; Notable for the following:    Lactic Acid, Venous 8.32 (*)    All other components within normal limits  CULTURE, BLOOD (ROUTINE X 2)  CULTURE, BLOOD (ROUTINE X 2)  URINALYSIS, ROUTINE W REFLEX MICROSCOPIC  PROTIME-INR  URINALYSIS, ROUTINE W REFLEX MICROSCOPIC  I-STAT CHEM 8, ED  I-STAT TROPOININ, ED  TYPE AND SCREEN    EKG  EKG Interpretation  Date/Time:  Tuesday July 22 2016 10:35:09 EDT Ventricular Rate:  84 PR Interval:    QRS Duration: 107 QT Interval:  547 QTC Calculation: 620 R Axis:   4 Text Interpretation:  Atrial fibrillation RSR' in V1 or V2, probably normal variant Nonspecific repol abnormality, diffuse leads Prolonged QT interval Baseline wander in lead(s) V3 Confirmed by Nancylee Gaines  MD, Manraj Yeo [phone]) on 07/31/2016 12:09:55 PM       Radiology Dg Chest Portable 1 View  Result Date: 08/05/2016 CLINICAL DATA:  Sepsis.  Altered mental status.  COPD. EXAM: PORTABLE CHEST 1 VIEW COMPARISON:  Chest x-ray dated 07/10/2010 FINDINGS: Heart size is within normal limits. Prominent left pericardial fat pad. Pulmonary vascularity is normal. Interstitial markings are accentuated due to a shallow inspiration. No acute infiltrates or effusions. Spinal cord stimulator in place. No acute bone abnormality. Large hiatal hernia. Calcification in the arch of the aorta. IMPRESSION: No acute abnormalities.  Large hiatal hernia. Aortic atherosclerosis. Electronically Signed   By: Lorriane Shire M.D.   On: 07/31/2016 10:59    Procedures Procedures (including critical care time)  Medications Ordered in ED Medications  vancomycin (VANCOCIN) IVPB 1000 mg/200 mL premix (1,000 mg Intravenous New Bag/Given 08/12/2016 1133)  potassium chloride 10 mEq in 100 mL IVPB (not administered)  sodium chloride 0.9 % bolus 1,000 mL (not administered)  levETIRAcetam (KEPPRA) IVPB 1000 mg/100 mL  premix (1,000 mg Intravenous Given 07/20/2016 1108)  sodium chloride 0.9 % bolus 2,000 mL (2,000 mLs Intravenous New Bag/Given 07/24/2016 1108)  cefTRIAXone (ROCEPHIN) 1 g in dextrose 5 % 50 mL IVPB (0 g Intravenous Stopped 08/14/2016 1135)     Initial Impression / Assessment and Plan / ED Course  I have reviewed the triage vital signs and the nursing notes.  Pertinent labs & imaging results that were available during my care of the patient were reviewed by me and considered in my medical decision making (see chart for details).     CRITICAL CARE Performed by: Lynasia Meloche L Total critical care time:45 minutes Critical care time was exclusive of separately billable procedures and treating other patients. Critical care was necessary to treat or prevent imminent or life-threatening deterioration. Critical care was time spent personally by me on the following activities: development of treatment plan with patient and/or surrogate as well as nursing, discussions with consultants, evaluation of patient's response to treatment, examination of patient, obtaining history from patient or surrogate, ordering and performing treatments and interventions, ordering and review of laboratory studies, ordering and review of radiographic studies, pulse oximetry and re-evaluation of patient's condition.  Patient has seizure in the emergency department which lasted about 1 minute. She was promptly given some Keppra. Patient was treated as a septic patient initially after labs returned and appeared the patient was hyponatremic and hypokalemic.   Suspect patient had a seizure from the hyponatremia. She will be admitted to ICU Final Clinical Impressions(s) /  ED Diagnoses   Final diagnoses:  None    New Prescriptions New Prescriptions   No medications on file  The chart was scribed for me under my direct supervision.  I personally performed the history, physical, and medical decision making and all procedures in the  evaluation of this patient.Milton Ferguson, MD 07/27/2016 [phone]

## 2016-07-22 NOTE — Progress Notes (Signed)
Pt alert and able to converse at this time. Oriented x2  Ericka Pontiff, RN

## 2016-07-23 ENCOUNTER — Encounter (HOSPITAL_COMMUNITY): Payer: Self-pay | Admitting: Primary Care

## 2016-07-23 DIAGNOSIS — K625 Hemorrhage of anus and rectum: Secondary | ICD-10-CM

## 2016-07-23 DIAGNOSIS — E871 Hypo-osmolality and hyponatremia: Principal | ICD-10-CM

## 2016-07-23 DIAGNOSIS — G40411 Other generalized epilepsy and epileptic syndromes, intractable, with status epilepticus: Secondary | ICD-10-CM | POA: Diagnosis not present

## 2016-07-23 DIAGNOSIS — G9340 Encephalopathy, unspecified: Secondary | ICD-10-CM | POA: Diagnosis not present

## 2016-07-23 DIAGNOSIS — E872 Acidosis: Secondary | ICD-10-CM

## 2016-07-23 DIAGNOSIS — Z7189 Other specified counseling: Secondary | ICD-10-CM

## 2016-07-23 DIAGNOSIS — E876 Hypokalemia: Secondary | ICD-10-CM

## 2016-07-23 DIAGNOSIS — Z515 Encounter for palliative care: Secondary | ICD-10-CM

## 2016-07-23 LAB — BASIC METABOLIC PANEL
ANION GAP: 11 (ref 5–15)
ANION GAP: 12 (ref 5–15)
Anion gap: 14 (ref 5–15)
Anion gap: 11 (ref 5–15)
Anion gap: 12 (ref 5–15)
BUN: 6 mg/dL (ref 6–20)
BUN: 6 mg/dL (ref 6–20)
BUN: 6 mg/dL (ref 6–20)
BUN: 5 mg/dL — ABNORMAL LOW (ref 6–20)
BUN: 5 mg/dL — ABNORMAL LOW (ref 6–20)
CALCIUM: 7.8 mg/dL — AB (ref 8.9–10.3)
CHLORIDE: 79 mmol/L — AB (ref 101–111)
CHLORIDE: 82 mmol/L — AB (ref 101–111)
CHLORIDE: 82 mmol/L — AB (ref 101–111)
CHLORIDE: 82 mmol/L — AB (ref 101–111)
CHLORIDE: 81 mmol/L — AB (ref 101–111)
CO2: 24 mmol/L (ref 22–32)
CO2: 25 mmol/L (ref 22–32)
CO2: 26 mmol/L (ref 22–32)
CO2: 28 mmol/L (ref 22–32)
CO2: 27 mmol/L (ref 22–32)
CREATININE: 0.65 mg/dL (ref 0.44–1.00)
CREATININE: 0.58 mg/dL (ref 0.44–1.00)
CREATININE: 0.53 mg/dL (ref 0.44–1.00)
CREATININE: 0.63 mg/dL (ref 0.44–1.00)
CREATININE: 0.63 mg/dL (ref 0.44–1.00)
Calcium: 7.9 mg/dL — ABNORMAL LOW (ref 8.9–10.3)
Calcium: 7.7 mg/dL — ABNORMAL LOW (ref 8.9–10.3)
Calcium: 7.8 mg/dL — ABNORMAL LOW (ref 8.9–10.3)
Calcium: 7.8 mg/dL — ABNORMAL LOW (ref 8.9–10.3)
GFR calc non Af Amer: >60 mL/min (ref >60)
GFR calc non Af Amer: >60 mL/min (ref >60)
GFR calc non Af Amer: >60 mL/min (ref >60)
GFR calc non Af Amer: >60 mL/min (ref >60)
Glucose, Bld: 124 mg/dL — ABNORMAL HIGH (ref 65–99)
Glucose, Bld: 117 mg/dL — ABNORMAL HIGH (ref 65–99)
Glucose, Bld: 125 mg/dL — ABNORMAL HIGH (ref 65–99)
Glucose, Bld: 137 mg/dL — ABNORMAL HIGH (ref 65–99)
Glucose, Bld: 149 mg/dL — ABNORMAL HIGH (ref 65–99)
POTASSIUM: 2.8 mmol/L — AB (ref 3.5–5.1)
POTASSIUM: 2.7 mmol/L — AB (ref 3.5–5.1)
POTASSIUM: 3 mmol/L — AB (ref 3.5–5.1)
Potassium: 2.5 mmol/L — CL (ref 3.5–5.1)
Potassium: 2.8 mmol/L — ABNORMAL LOW (ref 3.5–5.1)
Sodium: 117 mmol/L — CL (ref 135–145)
Sodium: 118 mmol/L — CL (ref 135–145)
Sodium: 120 mmol/L — ABNORMAL LOW (ref 135–145)
Sodium: 121 mmol/L — ABNORMAL LOW (ref 135–145)
Sodium: 120 mmol/L — ABNORMAL LOW (ref 135–145)

## 2016-07-23 LAB — TROPONIN I

## 2016-07-23 LAB — CBC
HEMATOCRIT: 38 % (ref 36.0–46.0)
Hemoglobin: 13.9 g/dL (ref 12.0–15.0)
MCH: 30.8 pg (ref 26.0–34.0)
MCHC: 36.6 g/dL — ABNORMAL HIGH (ref 30.0–36.0)
MCV: 84.1 fL (ref 78.0–100.0)
Platelets: 327 10*3/uL (ref 150–400)
RBC: 4.52 MIL/uL (ref 3.87–5.11)
RDW: 12.7 % (ref 11.5–15.5)
WBC: 14.8 10*3/uL — AB (ref 4.0–10.5)

## 2016-07-23 LAB — CORTISOL-AM, BLOOD: Cortisol - AM: 20.7 ug/dL (ref 6.7–22.6)

## 2016-07-23 LAB — MRSA PCR SCREENING: MRSA by PCR: POSITIVE — AB

## 2016-07-23 LAB — OSMOLALITY, URINE: OSMOLALITY UR: 275 mosm/kg — AB (ref 300–900)

## 2016-07-23 MED ORDER — PHENYTOIN SODIUM 50 MG/ML IJ SOLN
INTRAMUSCULAR | Status: AC
  Filled 2016-07-23: qty 20

## 2016-07-23 MED ORDER — PHENYTOIN SODIUM 50 MG/ML IJ SOLN
100.0000 mg | Freq: Three times a day (TID) | INTRAMUSCULAR | Status: DC
  Administered 2016-07-23 – 2016-07-24 (×4): 100 mg via INTRAVENOUS
  Filled 2016-07-23 (×4): qty 2

## 2016-07-23 MED ORDER — CHLORHEXIDINE GLUCONATE 0.12 % MT SOLN
15.0000 mL | Freq: Two times a day (BID) | OROMUCOSAL | Status: DC
  Administered 2016-07-23 – 2016-07-24 (×4): 15 mL via OROMUCOSAL
  Filled 2016-07-23 (×3): qty 15

## 2016-07-23 MED ORDER — POTASSIUM CHLORIDE 10 MEQ/100ML IV SOLN
10.0000 meq | INTRAVENOUS | Status: AC
  Administered 2016-07-23 (×4): 10 meq via INTRAVENOUS
  Filled 2016-07-23 (×4): qty 100

## 2016-07-23 MED ORDER — SODIUM CHLORIDE 0.9 % IV SOLN
1000.0000 mg | Freq: Two times a day (BID) | INTRAVENOUS | Status: DC
  Filled 2016-07-23: qty 10

## 2016-07-23 MED ORDER — FUROSEMIDE 10 MG/ML IJ SOLN
40.0000 mg | Freq: Once | INTRAMUSCULAR | Status: AC
  Administered 2016-07-23: 40 mg via INTRAVENOUS
  Filled 2016-07-23: qty 4

## 2016-07-23 MED ORDER — ORAL CARE MOUTH RINSE
15.0000 mL | Freq: Two times a day (BID) | OROMUCOSAL | Status: DC
  Administered 2016-07-23 – 2016-07-24 (×4): 15 mL via OROMUCOSAL

## 2016-07-23 MED ORDER — SODIUM CHLORIDE 0.9 % IV SOLN
1000.0000 mg | Freq: Once | INTRAVENOUS | Status: AC
  Administered 2016-07-23: 1000 mg via INTRAVENOUS
  Filled 2016-07-23: qty 20

## 2016-07-23 MED ORDER — LORAZEPAM 2 MG/ML IJ SOLN
1.0000 mg | Freq: Once | INTRAMUSCULAR | Status: AC
  Administered 2016-07-23: 1 mg via INTRAVENOUS
  Filled 2016-07-23: qty 1

## 2016-07-23 MED ORDER — SODIUM CHLORIDE 0.9 % IV SOLN
1000.0000 mg | Freq: Once | INTRAVENOUS | Status: AC
  Administered 2016-07-23: 1000 mg via INTRAVENOUS
  Filled 2016-07-23: qty 10

## 2016-07-23 MED ORDER — POTASSIUM CHLORIDE 10 MEQ/100ML IV SOLN
10.0000 meq | INTRAVENOUS | Status: AC
  Administered 2016-07-23 (×6): 10 meq via INTRAVENOUS
  Filled 2016-07-23 (×2): qty 100

## 2016-07-23 MED ORDER — LEVETIRACETAM 500 MG/5ML IV SOLN
INTRAVENOUS | Status: AC
  Filled 2016-07-23: qty 10

## 2016-07-23 NOTE — Progress Notes (Signed)
Witnessed Pt having seizure at 0200. Duration of 83min and 5 seconds. MD notified. No new orders at this time.  Ericka Pontiff, RN 2:11 AM 07/23/16

## 2016-07-23 NOTE — Progress Notes (Signed)
Dr. Marin Comment came to assess the pt and ordered to start 3% fluids.  Senaida [place]ote [place] Mote, RN

## 2016-07-23 NOTE — Progress Notes (Signed)
Will d/c Keppra as there have been reports causing Hyponatremia.   Orvan Falconer MD [facility].

## 2016-07-23 NOTE — Consult Note (Signed)
Consultation Note Date: 07/23/2016   Patient Name: Lisa Saunders  DOB: [birth year]  MRN: [id number]  Age / Sex: 83 y.o., female  PCP: Lisa School, MD Referring Physician: Koleen Nimrod Saunders*  Reason for Consultation: Establishing goals of care and Psychosocial/spiritual support  HPI/Patient Profile: 83 y.o. female  with past medical history of Anxiety and depression, chronic cystitis, COPD, fibromyalgia, hypertension, mixed incontinence, functional decline over the last year, with particular declines over the last 2 months being basically bedbound admitted on 08/05/2016 with hyponatremia (115).   Clinical Assessment and Goals of Care: Lisa Saunders is lying quietly in bed. She briefly makes eye contact when I request. 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 During our conversation, son Lisa Saunders and his Saunders Lisa Saunders arrived. We talk about Lisa Saunders's chronic health history, we talk about her functional status. Lisa Saunders and Lisa Saunders share that Lisa Saunders has declined over the last year, with a marked declined over the past months until she is basically bedbound for the last 2 months.  I share my worry over Lisa Saunders's lab work, her low-sodium rain shared that Lisa Saunders wanted brainwave testing, and shares with him that even if she has brain activity, her quality of life and functional status will decline after this illness. I share a diagram of the chronic illness pathway, what is normal and expected. I encourage Lisa Saunders, and Lisa Saunders to make choices for their mother based on who the person she was 10 years ago, what would she say about where she is now. Lisa Saunders immediately states that his mother was a very active and vibrant person and would not want want to live without quality of life. Lisa Saunders agrees, and states that she will speak with Lisa Saunders, and other son Lisa Saunders later this  afternoon. Lisa Saunders states that Lisa Saunders sister, Lisa Saunders's mother Lisa Saunders will participate in family discussion.  We talk about 24 hours for information gathering, that our neurologist will likely come later in the day. We plan for a family meeting 4/5 at Lisa Saunders of attorney [employer] - no legal paperwork at this time, Saunders Lisa Saunders, 2 sons Lisa Saunders and Lisa Saunders.    SUMMARY OF RECOMMENDATIONS   24 to 48 hours for information gathering. No extraordinary measures such as CPR for intubation. No PEG tube  Code Status/Advance Care Planning:  DNR  Symptom Management:   per hospitalist, no additional needs at this time  Palliative Prophylaxis:   Oral Care and Turn Reposition  Additional Recommendations (Limitations, Scope, Preferences):  No CPR for intubation, no PEG tube  Psycho-social/Spiritual:   Desire for further Chaplaincy support:yes  Additional Recommendations: Caregiving  Support/Resources and Education on Hospice  Prognosis:   Unable to determine, based on outcomes and family choices. Days likely if family decides to focus on comfort and dignity and stop treatment. Weeks possible if full treatment options are wanted.  Discharge Planning: To Be Determined      Primary Diagnoses: Present on Admission: .  Hyponatremia . Hypertension . Back pain . PAF (paroxysmal atrial fibrillation) ([place])   I have reviewed the medical record, interviewed the patient and family, and examined the patient. The following aspects are pertinent.  Past Medical History:  Diagnosis Date  . Anemia   . Anxiety   . Back pain   . Chronic cystitis   . Chronic pain   . Complication of anesthesia    pt states"I was in a coma for 5 days after my 3rd back surgery and no one knows why". Saunders states she hasnt been right since then,doesnt walk right or anything" Dr Durene Cal was MD  . COPD (chronic obstructive pulmonary disease) ([place])   . Depression   . Fibromyalgia   .  Hypertension   . Mixed incontinence    Social History   Social History  . Marital status: Married    Spouse name: Lisa Saunders  . Number of children: 2  . Years of education: college   Occupational History  .      retired   Social History Main Topics  . Smoking status: Former Smoker    Quit date: 04/21/1964  . Smokeless tobacco: Never Used  . Alcohol use No  . Drug use: No  . Sexual activity: Yes    Birth control/ protection: Surgical   Other Topics Concern  . None   Social History Narrative   Patient consumes no caffeine   Family History  Problem Relation Age of Onset  . Heart failure Mother   . Heart attack Father    Scheduled Meds: . calcitonin (salmon)  1 spray Alternating Nares Daily  . chlorhexidine  15 mL Mouth Rinse BID  . mouth rinse  15 mL Mouth Rinse q12n4p  . mometasone-formoterol  2 puff Inhalation BID  . phenytoin (DILANTIN) IV  100 mg Intravenous Q8H  . piperacillin-tazobactam (ZOSYN)  IV  3.375 g Intravenous Q8H  . potassium chloride  40 mEq Oral Once  . vancomycin  1,000 mg Intravenous Q12H   Continuous Infusions: . 0.9 % NaCl with KCl 40 mEq / L 135 mL/hr (07/23/16 1154)   PRN Meds:.albuterol Medications Prior to Admission:  Prior to Admission medications   Medication Sig Start Date End Date Taking? Authorizing Provider  ADVAIR DISKUS 250-50 MCG/DOSE AEPB Inhale 1 puff into the lungs 2 (two) times daily.  06/09/11  Yes Historical Provider, MD  albuterol (PROVENTIL HFA;VENTOLIN HFA) 108 (90 BASE) MCG/ACT inhaler Inhale 2 puffs into the lungs 2 (two) times daily as needed for shortness of breath. For shortness of breath   Yes Historical Provider, MD  ALPRAZolam Duanne Moron) 1 MG tablet Take 1 mg by mouth 4 (four) times daily as needed for anxiety. For anxiety 06/25/11  Yes Historical Provider, MD  aspirin 81 MG tablet Take 81 mg by mouth daily.   Yes Historical Provider, MD  budesonide-formoterol (SYMBICORT) 80-4.5 MCG/ACT inhaler Inhale 2 puffs into the lungs  2 (two) times daily.   Yes Historical Provider, MD  calcitonin, salmon, (MIACALCIN/FORTICAL) 200 UNIT/ACT nasal spray Place 1 spray into the nose daily.   Yes Historical Provider, MD  Calcium Carbonate-Vitamin D (CALTRATE 600+D PO) Take 1 tablet by mouth daily.    Yes Historical Provider, MD  cephALEXin (KEFLEX) 500 MG capsule Take 1 capsule (500 mg total) by mouth 4 (four) times daily. 02/19/16  Yes Francine Graven, DO  cholecalciferol (VITAMIN D) 1000 UNITS tablet Take 1,000 Units by mouth daily.   Yes Historical Provider, MD  Cranberry 360 MG CAPS Take  300 mg by mouth daily.   Yes Historical Provider, MD  Docusate Calcium (STOOL SOFTENER PO) Take 100 mg by mouth 2 (two) times daily. 03/07/14  Yes Historical Provider, MD  ferrous sulfate 325 (65 FE) MG tablet Take 325 mg by mouth 2 (two) times daily after a meal.    Yes Historical Provider, MD  FLUoxetine (PROZAC) 20 MG tablet Take 20 mg by mouth daily.   Yes Historical Provider, MD  furosemide (LASIX) 40 MG tablet Take 40 mg by mouth 2 (two) times daily.  06/23/11  Yes Historical Provider, MD  Garlic 6314 MG CAPS Take 1 capsule by mouth daily.   Yes Historical Provider, MD  Incontinence Supply Disposable (BLADDER CONTROL PADS EX ABSORB) Cane Beds  03/06/14  Yes Historical Provider, MD  KLOR-CON M20 20 MEQ tablet Take 20 mEq by mouth 2 (two) times daily.  04/29/11  Yes Historical Provider, MD  metolazone (ZAROXOLYN) 2.5 MG tablet Take 1 tablet by mouth daily. 06/23/16  Yes Historical Provider, MD  metoprolol (LOPRESSOR) 50 MG tablet Take 50 mg by mouth 2 (two) times daily.   Yes Historical Provider, MD  oxybutynin (DITROPAN) 5 MG tablet Take 5 mg by mouth 2 (two) times daily. Reported on 06/26/2015   Yes Historical Provider, MD  oxyCODONE (ROXICODONE) 15 MG immediate release tablet Take 1-2 tablets by mouth every 4 (four) hours. 05/22/16  Yes Historical Provider, MD  Oxycodone HCl 10 MG TABS Take 10 mg by mouth every 4 (four) hours as needed for severe pain.  Take a max of 2 tablets per day with percocet for severe pain.    Yes Historical Provider, MD  risperiDONE (RISPERDAL) 0.5 MG tablet Take 1 tablet by mouth daily. 07/09/16  Yes Historical Provider, MD  traMADol (ULTRAM) 50 MG tablet Take 50 mg by mouth 4 (four) times daily.   Yes Historical Provider, MD  vitamin B-12 (CYANOCOBALAMIN) 1000 MCG tablet Take 1,000 mcg by mouth daily.   Yes Historical Provider, MD  zolpidem (AMBIEN) 10 MG tablet Take 10 mg by mouth at bedtime as needed for sleep.    Yes Historical Provider, MD  Multiple Vitamins-Minerals (HAIR SKIN AND NAILS FORMULA PO) Take 1 tablet by mouth 2 (two) times daily.     Historical Provider, MD  oxyCODONE-acetaminophen (PERCOCET) 10-325 MG tablet Take 1 tablet by mouth every 4 (four) hours as needed for pain.     Historical Provider, MD  pantoprazole (PROTONIX) 40 MG tablet TAKE ONE TABLET BY MOUTH ONCE DAILY BEFORE SUPPER. Patient not taking: Reported on 08/07/2016 11/08/15   Rogene Houston, MD  psyllium (REGULOID) 0.52 g capsule Take 1.04 g by mouth daily.    Historical Provider, MD   Allergies  Allergen Reactions  . Morphine     COMA  . Prednisone     Makes me crazy and mean  . Procardia [Nifedipine] Other (See Comments)    unknown  . Latex Rash   Review of Systems  Unable to perform ROS: Acuity of condition    Physical Exam  Constitutional: No distress.  Nonverbal, makes brief eye contact  HENT:  Head: Normocephalic and atraumatic.  Cardiovascular:  Rate 120's to 160s at times  Pulmonary/Chest: Effort normal. No respiratory distress.  Abdominal: Soft. She exhibits no distension.  Musculoskeletal: She exhibits no edema.  Neurological:  Lethargic, unable to follow commands effectively, unable to communicate effectively, briefly makes eye contact  Skin: Skin is warm and dry.  Nursing note and vitals reviewed.   Vital Signs:  BP 112/73   Pulse (!) 130   Temp 98.4 F (36.9 C) (Axillary)   Resp (!) 21   Ht 5\' 4"  (1.626  m)   Wt 75.4 kg (166 lb 3.6 oz)   SpO2 92%   BMI 28.53 kg/m  Pain Assessment: CPOT   Pain Score: Asleep   SpO2: SpO2: 92 % O2 Device:SpO2: 92 % O2 Flow Rate: .O2 Flow Rate (L/min): 6 L/min  IO: Intake/output summary:  Intake/Output Summary (Last 24 hours) at 07/23/16 1556 Last data filed at 07/23/16 1000  Gross per 24 hour  Intake             1450 ml  Output             3601 ml  Net            -2151 ml    LBM: Last BM Date: 07/23/16 Baseline Weight: Weight: 73 kg (161 lb) Most recent weight: Weight: 75.4 kg (166 lb 3.6 oz)     Palliative Assessment/Data:   Flowsheet Rows     Most Recent Value  Intake Tab  Referral Department  Hospitalist  Unit at Time of Referral  ICU  Palliative Care Primary Diagnosis  Neurology  Date Notified  07/23/16  Palliative Care Type  New Palliative care  Reason for referral  Clarify Goals of Care  Date of Admission  07/29/2016  Date first seen by Palliative Care  07/23/16  # of days Palliative referral response time  0 Day(s)  # of days IP prior to Palliative referral  1  Clinical Assessment  Palliative Performance Scale Score  10%  Pain Max last 24 hours  Not able to report  Pain Min Last 24 hours  Not able to report  Dyspnea Max Last 24 Hours  Not able to report  Dyspnea Min Last 24 hours  Not able to report  Psychosocial & Spiritual Assessment  Palliative Care Outcomes  Patient/Family meeting held?  Yes  Who was at the meeting?  Son Lisa Saunders and his Saunders Lisa Saunders, Lisa Saunders  Palliative Care Outcomes  Provided psychosocial or spiritual support, Provided end of life care assistance, Clarified goals of care  Patient/Family wishes: Interventions discontinued/not started   Mechanical Ventilation, Tube feedings/TPN      Time In: 1410 Time Out: 1445 Time Total: 35 minutes Greater than 50%  of this time was spent counseling and coordinating care related to the above assessment and plan.  Signed by: Drue Novel, NP   Please contact  Palliative Medicine Team phone at [phone] for questions and concerns.  For individual provider: See Shea Evans

## 2016-07-23 NOTE — Progress Notes (Signed)
PROGRESS NOTE    Lisa Saunders  HDQ:[id number] DOB: 1933/01/09 DOA: 08/03/2016 PCP: Glo Herring, MD     Brief Narrative:  83 year old woman admitted to the hospital on 4/3 due to blood in stool. She subsequently had a seizure in the emergency department and was found to have a sodium of 114. She was placed on normal saline and admitted to the hospital, unfortunately she had greater than 8 seizure events overnight and decision was made to place her on hypertonic saline which was discontinued on 4/4 after her most recent sodium level was found to be 120. Palliative care discussions are ongoing.   Assessment & Plan:   Principal Problem:   Hyponatremia Active Problems:   Hypertension   Back pain   PAF (paroxysmal atrial fibrillation) ([facility])   Lactic acidosis   Hypokalemia   Palliative care encounter   Goals of care, counseling/discussion   Hyponatremia -Sodium level currently around 120, continue fluids as directed by nephrology. Did receive hypertonic saline overnight given recalcitrant seizures.  Seizures -Question if related to hyponatremia, neurology consultation is pending.  History of paroxysmal atrial fibrillation -Not currently on anticoagulation given initial presentation of GI bleed. -Has had episodes of RVR especially when actively seizing.  Acute metabolic encephalopathy -Believed secondary to hyponatremia and multiple seizures overnight. -It is quite possible that she might have some permanent brain damage from recurrent seizures and hyponatremia. -Neurology consultation has been requested.    DVT prophylaxis: SCDs  Code Status: DO NOT RESUSCITATE  Family Communication: Discussed with husband, son, niece at bedside  Disposition Plan: Keep in ICU today, final disposition pending palliative care discussions with family.  Consultants:  Nephrology  Neurology  Palliative care  Procedures:  None  Antimicrobials:  Anti-infectives    Start     Dose/Rate  Route Frequency Ordered Stop   08/11/2016 2100  piperacillin-tazobactam (ZOSYN) IVPB 3.375 g     3.375 g 12.5 mL/hr over 240 Minutes Intravenous Every 8 hours 08/13/2016 1904     08/11/2016 2000  vancomycin (VANCOCIN) IVPB 1000 mg/200 mL premix     1,000 mg 200 mL/hr over 60 Minutes Intravenous Every 12 hours 07/21/2016 1903     08/01/2016 1045  vancomycin (VANCOCIN) IVPB 1000 mg/200 mL premix     1,000 mg 200 mL/hr over 60 Minutes Intravenous  Once 08/13/2016 1039 07/25/2016 1233   08/04/2016 1030  cefTRIAXone (ROCEPHIN) 1 g in dextrose 5 % 50 mL IVPB     1 g Intravenous  Once 08/06/2016 1025 07/30/2016 1135       Subjective: Sleepy, can open eyes and move head towards voices when being spoken to, however is unable to verbalize.   Objective: Vitals:   07/23/16 1400 07/23/16 1500 07/23/16 1600 07/23/16 1700  BP: (!) 118/55 110/76 124/73 131/68  Pulse: (!) 142 (!) 129 (!) 134 (!) 140  Resp: (!) 22 (!) 22 (!) 31 (!) 25  Temp:      TempSrc:      SpO2: (!) 88% 95% (!) 89% 91%  Weight:      Height:        Intake/Output Summary (Last 24 hours) at 07/23/16 1812 Last data filed at 07/23/16 1700  Gross per 24 hour  Intake          4457.25 ml  Output             3601 ml  Net           856.25 ml   [employer]  07/25/2016 1320 07/30/2016 1700 07/23/16 0500  Weight: 73 kg (161 lb) 75.4 kg (166 lb 3.6 oz) 75.4 kg (166 lb 3.6 oz)    Examination:  General exam:Drowsy  Respiratory system: Clear to auscultation. Respiratory effort normal. Cardiovascular system Tachycardic, irregular. Gastrointestinal system: Abdomen is nondistended, soft and nontender. No organomegaly or masses felt. Normal bowel sounds heard. Central nervous system: Unable to assess given current mental state  Extremities: No C/C/E, +pedal pulses Skin: No rashes, lesions or ulcers Psychiatry: Unable to assess given current mental state     Data Reviewed: I have personally reviewed following labs and imaging  studies  CBC:  Recent Labs Lab 08/11/2016 1024 07/23/16 0413  WBC 16.8* 14.8*  NEUTROABS 13.9*  --   HGB 14.6 13.9  HCT 40.3 38.0  MCV 84.5 84.1  PLT 348 557   Basic Metabolic Panel:  Recent Labs Lab 08/07/2016 1341  07/31/2016 2000  07/23/16 0207 07/23/16 0413 07/23/16 0636 07/23/16 0807 07/23/16 1014  NA 115*  < >  --   < > 117* 118* 120* 121* 120*  K <2.0*  < >  --   < > 2.5* 2.8* 2.8* 2.7* 3.0*  CL 73*  < >  --   < > 79* 82* 82* 82* 81*  CO2 30  < >  --   < > 24 25 26 28 27   GLUCOSE 114*  < >  --   < > 124* 117* 125* 137* 149*  BUN 12  < >  --   < > 6 6 6  5* 5*  CREATININE 0.68  < >  --   < > 0.65 0.58 0.53 0.63 0.63  CALCIUM 7.7*  < >  --   < > 7.9* 7.7* 7.8* 7.8* 7.8*  MG 1.2*  --   --   --   --   --   --   --   --   PHOS  --   --  2.3*  --   --   --   --   --   --   < > = values in this interval not displayed. GFR: Estimated Creatinine Clearance: 54.9 mL/min (by C-G formula based on SCr of 0.63 mg/dL). Liver Function Tests:  Recent Labs Lab 08/16/2016 1024 07/31/2016 1133  AST 47* 34  ALT 26 21  ALKPHOS 79 62  BILITOT 1.4* 0.8  PROT 7.3 5.9*  ALBUMIN 3.8 3.0*   No results for input(s): LIPASE, AMYLASE in the last 168 hours. No results for input(s): AMMONIA in the last 168 hours. Coagulation Profile:  Recent Labs Lab 08/16/2016 1024  INR 1.15   Cardiac Enzymes:  Recent Labs Lab 08/13/2016 1818 07/23/16 0044 07/23/16 0636  TROPONINI <0.03 <0.03 <0.03   BNP (last 3 results) No results for input(s): PROBNP in the last 8760 hours. HbA1C: No results for input(s): HGBA1C in the last 72 hours. CBG: No results for input(s): GLUCAP in the last 168 hours. Lipid Profile: No results for input(s): CHOL, HDL, LDLCALC, TRIG, CHOLHDL, LDLDIRECT in the last 72 hours. Thyroid Function Tests:  Recent Labs  07/21/2016 1024  TSH 1.599   Anemia Panel: No results for input(s): VITAMINB12, FOLATE, FERRITIN, TIBC, IRON, RETICCTPCT in the last 72 hours. Urine  analysis:    Component Value Date/Time   COLORURINE YELLOW 07/21/2016 1231   APPEARANCEUR CLEAR 07/23/2016 1231   LABSPEC 1.008 08/11/2016 1231   PHURINE 7.0 08/13/2016 1231   GLUCOSEU NEGATIVE 08/12/2016 1231   HGBUR NEGATIVE  07/23/2016 [place] 07/28/2016 1231   KETONESUR NEGATIVE 07/25/2016 1231   PROTEINUR NEGATIVE 08/04/2016 1231   UROBILINOGEN 0.2 07/19/2011 0859   NITRITE NEGATIVE 07/28/2016 1231   LEUKOCYTESUR NEGATIVE 07/27/2016 1231   Sepsis Labs: @LABRCNTIP (procalcitonin:4,lacticidven:4)  ) Recent Results (from the past 240 hour(s))  Blood Culture (routine x 2)     Status: None (Preliminary result)   Collection Time: 08/06/2016 10:24 AM  Result Value Ref Range Status   Specimen Description BLOOD RIGHT FOREARM  Final   Special Requests   Final    BOTTLES DRAWN AEROBIC AND ANAEROBIC Blood Culture adequate volume   Culture NO GROWTH < 24 HOURS  Final   Report Status PENDING  Incomplete  Blood Culture (routine x 2)     Status: None (Preliminary result)   Collection Time: 07/24/2016 10:36 AM  Result Value Ref Range Status   Specimen Description BLOOD RIGHT HAND  Final   Special Requests   Final    BOTTLES DRAWN AEROBIC ONLY Blood Culture results may not be optimal due to an inadequate volume of blood received in culture bottles   Culture NO GROWTH < 24 HOURS  Final   Report Status PENDING  Incomplete  MRSA PCR Screening     Status: Abnormal   Collection Time: 07/21/2016  5:36 PM  Result Value Ref Range Status   MRSA by PCR POSITIVE (A) NEGATIVE Final    Comment:        The GeneXpert MRSA Assay (FDA approved for NASAL specimens only), is one component of a comprehensive MRSA colonization surveillance program. It is not intended to diagnose MRSA infection nor to guide or monitor treatment for MRSA infections. RESULT CALLED TO, READ BACK BY AND VERIFIED WITH:  DANIELS,J @ 0017 ON 07/23/16 BY JUW          Radiology Studies: Ct Head Wo  Contrast  Result Date: 07/28/2016 CLINICAL DATA:  83 year old hypertensive female less active for the past week. Possible seizure. Initial encounter. EXAM: CT HEAD WITHOUT CONTRAST TECHNIQUE: Contiguous axial images were obtained from the base of the skull through the vertex without intravenous contrast. COMPARISON:  09/25/2010. FINDINGS: Brain: No intracranial hemorrhage or CT evidence of large acute infarct. Moderate chronic microvascular changes. Global atrophy without hydrocephalus. No intracranial mass lesion noted on this unenhanced exam. Vascular: Vascular calcifications. Skull: No acute abnormality. Sinuses/Orbits: Post lens replacement without acute orbital abnormality. Complete opacification left maxillary sinus. Other: Negative. IMPRESSION: No intracranial hemorrhage or CT evidence of large acute infarct. Moderate chronic microvascular changes. Global atrophy. Complete opacification left maxillary sinus. Electronically Signed   By: Genia Del M.D.   On: 07/30/2016 12:32   Dg Chest Portable 1 View  Result Date: 08/01/2016 CLINICAL DATA:  Sepsis.  Altered mental status.  COPD. EXAM: PORTABLE CHEST 1 VIEW COMPARISON:  Chest x-ray dated 07/10/2010 FINDINGS: Heart size is within normal limits. Prominent left pericardial fat pad. Pulmonary vascularity is normal. Interstitial markings are accentuated due to a shallow inspiration. No acute infiltrates or effusions. Spinal cord stimulator in place. No acute bone abnormality. Large hiatal hernia. Calcification in the arch of the aorta. IMPRESSION: No acute abnormalities.  Large hiatal hernia. Aortic atherosclerosis. Electronically Signed   By: Lorriane Shire M.D.   On: 08/18/2016 10:59        Scheduled Meds: . calcitonin (salmon)  1 spray Alternating Nares Daily  . chlorhexidine  15 mL Mouth Rinse BID  . mouth rinse  15 mL Mouth Rinse q12n4p  .  phenytoin (DILANTIN) IV  100 mg Intravenous Q8H  . piperacillin-tazobactam (ZOSYN)  IV  3.375 g  Intravenous Q8H  . potassium chloride  40 mEq Oral Once  . vancomycin  1,000 mg Intravenous Q12H   Continuous Infusions: . 0.9 % NaCl with KCl 40 mEq / L 135 mL/hr (07/23/16 1809)     LOS: 1 day    Time spent: 35 minutes. Greater than 50% of this time was spent in direct contact with the patient coordinating care.     Lelon Frohlich, MD [facility] Pager [phone]  If 7PM-7AM, please contact night-coverage [email or web address] Password [facility] 07/23/2016, 6:12 PM

## 2016-07-23 NOTE — Plan of Care (Signed)
Problem: Safety: Goal: Ability to remain free from injury will improve Outcome: Progressing Patient has seizure pads around bed and suction at bedside. Family in room as well to monitor patient.

## 2016-07-23 NOTE — Progress Notes (Signed)
Witnessed Pt having seizure 0215am for 50 seconds, 0220 am for 20 seconds, 0225 am 1 min 30 secs, 0228 am for 10 seconds, 0230 am for 1 min 10 sec, and 0236 for 10 sec. MD notified at 0227 am ordered to give her ativan 1 mg per previous order in [facility]. MD called back at 0236 when Pt was having a seizure and he ordered for me to give another 1 mg dose of ativan and to call him back in 10 minutes.  Ericka Pontiff, RN 2:46 AM 07/23/16

## 2016-07-23 NOTE — Progress Notes (Signed)
Critical potassium level reported to Dr. Jerilee Hoh. Patient is currently receiving runs of KCl at this time and Dr. Jerilee Hoh told me to let her know about any critical labs only if there is a significant change.

## 2016-07-23 NOTE — Progress Notes (Signed)
Called re Na of 114. She had several seizures today, and attempted at IV NS was not correcting her Na adequately. She will need IV hypertonic saline at this time ASAP. Central line preferred, but we will give in in PIV and re-evaluate for central/PICC line if require hypertonic longer. Will check BMET every 2 hours, and d/c once Na reaches 120 mE/L or above. Calcium is corrected OK, and not low.  K is low, and will continued to be supplemented.  Thanks,  Orvan Falconer MD FACP> Hospitalist.

## 2016-07-23 NOTE — Consult Note (Signed)
Reason for Consult:rectal bleeding. Referring Physician: [name] is an 83 y.o. Saunders.  HPI: admitted yesterday thru the ED.  Sent to ED for weakness, and unsteadiness on her feet. Son is in room. She also has had some blood in her stools per son. She also had hematuria, which seems to have cleared.  RN on today reports she had 2 bloody stools during the night. No BM today. Patient is not able to give a hx today. She only responds to verbal stimuli. RN states she has had Ativan and Dilantin to stop the seizures. She was last seen by Dr. Laural Golden in March of 2017, for weight loss, GERD, and constipation. Her last colonoscopy was in December of 2014. Small polyps ablated via cold biopsy from hepatic flexure. Mucosa rest of the colon and rectum was normal. Small hemorrhoids below the dentate line along with anal papillae. She had single polyp removed which is lymphoid polyp.   CBC Latest Ref Rng & Units 07/23/2016 07/21/2016 02/19/2016  WBC 4.0 - 10.5 K/uL 14.8(H) 16.8(H) 7.1  Hemoglobin 12.0 - 15.0 g/dL 13.9 14.6 12.4  Hematocrit 36.0 - 46.0 % 38.0 40.3 37.9  Platelets 150 - 400 K/uL 327 348 392     Past Medical History:  Diagnosis Date  . Anemia   . Anxiety   . Back pain   . Chronic cystitis   . Chronic pain   . Complication of anesthesia    pt states"I was in a coma for 5 days after my 3rd back surgery and no one knows why". husband states she hasnt been right since then,doesnt walk right or anything" Dr Durene Cal was MD  . COPD (chronic obstructive pulmonary disease) ([place])   . Depression   . Fibromyalgia   . Hypertension   . Mixed incontinence     Past Surgical History:  Procedure Laterality Date  . ABDOMINAL HYSTERECTOMY    . APPENDECTOMY    . back surgery x 4    . CARDIAC CATHETERIZATION  11/2002   w/o signficant  (Dr. Gerrie Nordmann)  . CATARACT EXTRACTION Right   . CATARACT EXTRACTION W/PHACO Left 07/19/2012   Procedure: CATARACT EXTRACTION PHACO AND INTRAOCULAR LENS  PLACEMENT (IOC);  Surgeon: Williams Che, MD;  Location: [facility];  Service: Ophthalmology;  Laterality: Left;  CDE 12.60  . CHOLECYSTECTOMY    . COLONOSCOPY WITH PROPOFOL N/A 03/21/2013   Procedure: COLONOSCOPY WITH PROPOFOL;  Surgeon: Rogene Houston, MD;  Location: [facility];  Service: Endoscopy;  Laterality: N/A;  In cecum at 751, out at 0802 = 11 minutes total time  . KNEE SURGERY    . POLYPECTOMY N/A 03/21/2013   Procedure: POLYPECTOMY;  Surgeon: Rogene Houston, MD;  Location: [facility];  Service: Endoscopy;  Laterality: N/A;  . SPINAL CORD STIMULATOR IMPLANT    . TONSILLECTOMY    . TRANSTHORACIC ECHOCARDIOGRAM  11/16/2012   EF 33-54%, grade 2 diastolic dysfunction; LA severely dilated; mod TR;     Family History  Problem Relation Age of Onset  . Heart failure Mother   . Heart attack Father     Social History:  reports that she quit smoking about 52 years ago. She has never used smokeless tobacco. She reports that she does not drink alcohol or use drugs.  Allergies:  Allergies  Allergen Reactions  . Morphine     COMA  . Prednisone     Makes me crazy and mean  . Procardia [Nifedipine] Other (See Comments)  unknown  . Latex Rash    Medications: I have reviewed the patient's current medications.  Results for orders placed or performed during the hospital encounter of 07/21/2016 (from the past 48 hour(s))  Comprehensive metabolic panel     Status: Abnormal   Collection Time: 08/13/2016 10:24 AM  Result Value Ref Range   Sodium 114 (LL) 135 - 145 mmol/L    Comment: CRITICAL RESULT CALLED TO, READ BACK BY AND VERIFIED WITH: PATROW,B AT 11:05AM ON 08/14/2016 BY FESTERMAN,C    Potassium <2.0 (LL) 3.5 - 5.1 mmol/L    Comment: CRITICAL RESULT CALLED TO, READ BACK BY AND VERIFIED WITH: PATROW,B AT 11:05AM ON 07/21/2016 BY FESTERMAN,C    Chloride <65 (LL) 101 - 111 mmol/L    Comment: CRITICAL RESULT CALLED TO, READ BACK BY AND VERIFIED WITH: PATROW,B AT 11:05AM ON 08/08/2016 BY  FESTERMAN,C    CO2 29 22 - 32 mmol/L   Glucose, Bld 118 (H) 65 - 99 mg/dL   BUN 15 6 - 20 mg/dL   Creatinine, Ser 0.84 0.44 - 1.00 mg/dL   Calcium 8.6 (L) 8.9 - 10.3 mg/dL   Total Protein 7.3 6.5 - 8.1 g/dL   Albumin 3.8 3.5 - 5.0 g/dL   AST 47 (H) 15 - 41 U/L   ALT 26 14 - 54 U/L   Alkaline Phosphatase 79 38 - 126 U/L   Total Bilirubin 1.4 (H) 0.3 - 1.2 mg/dL   GFR calc non Af Amer >60 >60 mL/min   GFR calc Af Amer >60 >60 mL/min    Comment: (NOTE) The eGFR has been calculated using the CKD EPI equation. This calculation has not been validated in all clinical situations. eGFR's persistently <60 mL/min signify possible Chronic Kidney Disease.   CBC WITH DIFFERENTIAL     Status: Abnormal   Collection Time: 08/01/2016 10:24 AM  Result Value Ref Range   WBC 16.8 (H) 4.0 - 10.5 K/uL   RBC 4.77 3.87 - 5.11 MIL/uL   Hemoglobin 14.6 12.0 - 15.0 g/dL   HCT 40.3 36.0 - 46.0 %   MCV 84.5 78.0 - 100.0 fL   MCH 30.6 26.0 - 34.0 pg   MCHC 36.2 (H) 30.0 - 36.0 g/dL   RDW 12.3 11.5 - 15.5 %   Platelets 348 150 - 400 K/uL   Neutrophils Relative % 83 %   Neutro Abs 13.9 (H) 1.7 - 7.7 K/uL   Lymphocytes Relative 9 %   Lymphs Abs 1.6 0.7 - 4.0 K/uL   Monocytes Relative 8 %   Monocytes Absolute 1.3 (H) 0.1 - 1.0 K/uL   Eosinophils Relative 0 %   Eosinophils Absolute 0.1 0.0 - 0.7 K/uL   Basophils Relative 0 %   Basophils Absolute 0.0 0.0 - 0.1 K/uL  Blood Culture (routine x 2)     Status: None (Preliminary result)   Collection Time: 08/07/2016 10:24 AM  Result Value Ref Range   Specimen Description BLOOD RIGHT FOREARM    Special Requests      BOTTLES DRAWN AEROBIC AND ANAEROBIC Blood Culture adequate volume   Culture NO GROWTH < 24 HOURS    Report Status PENDING   Type and screen     Status: None   Collection Time: 08/09/2016 10:24 AM  Result Value Ref Range   ABO/RH(D) O POS    Antibody Screen NEG    Sample Expiration 07/25/2016   Protime-INR     Status: None   Collection Time:  07/24/2016 10:24 AM  Result  Value Ref Range   Prothrombin Time 14.8 11.4 - 15.2 seconds   INR 1.15   TSH     Status: None   Collection Time: 08/04/2016 10:24 AM  Result Value Ref Range   TSH 1.599 0.350 - 4.500 uIU/mL    Comment: Performed by a 3rd Generation assay with a functional sensitivity of <=0.01 uIU/mL.  Urinalysis, Routine w reflex microscopic     Status: None   Collection Time: 08/17/2016 10:25 AM  Result Value Ref Range   Color, Urine YELLOW YELLOW   APPearance CLEAR CLEAR   Specific Gravity, Urine 1.005 1.005 - 1.030   pH 7.0 5.0 - 8.0   Glucose, UA NEGATIVE NEGATIVE mg/dL   Hgb urine dipstick NEGATIVE NEGATIVE   Bilirubin Urine NEGATIVE NEGATIVE   Ketones, ur NEGATIVE NEGATIVE mg/dL   Protein, ur NEGATIVE NEGATIVE mg/dL   Nitrite NEGATIVE NEGATIVE   Leukocytes, UA NEGATIVE NEGATIVE  Osmolality     Status: Abnormal   Collection Time: 08/05/2016 10:30 AM  Result Value Ref Range   Osmolality 235 (LL) 275 - 295 mOsm/kg    Comment: CRITICAL RESULT CALLED TO, READ BACK BY AND VERIFIED WITH: ROBERTS,T RN 2146 08/04/2016 MITCHELL,L PERFORMED AT [facility] Performed at [facility], [address], [place], [place] [address]   I-Stat CG4 Lactic Acid, ED  (not at  [facility])     Status: Abnormal   Collection Time: 08/16/2016 10:36 AM  Result Value Ref Range   Lactic Acid, Venous 8.32 (HH) 0.5 - 1.9 mmol/L   Comment NOTIFIED PHYSICIAN   Blood Culture (routine x 2)     Status: None (Preliminary result)   Collection Time: 08/03/2016 10:36 AM  Result Value Ref Range   Specimen Description BLOOD RIGHT HAND    Special Requests      BOTTLES DRAWN AEROBIC ONLY Blood Culture results may not be optimal due to an inadequate volume of blood received in culture bottles   Culture NO GROWTH < 24 HOURS    Report Status PENDING   I-stat troponin, ED     Status: None   Collection Time: 08/03/2016 10:51 AM  Result Value Ref Range   Troponin i, poc 0.01 0.00 - 0.08 ng/mL   Comment 3             Comment: Due to the release kinetics of cTnI, a negative result within the first hours of the onset of symptoms does not rule out myocardial infarction with certainty. If myocardial infarction is still suspected, repeat the test at appropriate intervals.   Comprehensive metabolic panel     Status: Abnormal   Collection Time: 08/05/2016 11:33 AM  Result Value Ref Range   Sodium 112 (LL) 135 - 145 mmol/L    Comment: CRITICAL RESULT CALLED TO, READ BACK BY AND VERIFIED WITH: WHITE,M AT 12:05PM ON 08/10/2016 BY FESTERMAN,C    Potassium <2.0 (LL) 3.5 - 5.1 mmol/L    Comment: CRITICAL RESULT CALLED TO, READ BACK BY AND VERIFIED WITH: WHITE,M AT 12:05PM ON 07/27/2016 BY FESTERMAN,C    Chloride 69 (L) 101 - 111 mmol/L   CO2 30 22 - 32 mmol/L   Glucose, Bld 126 (H) 65 - 99 mg/dL   BUN 14 6 - 20 mg/dL   Creatinine, Ser 0.73 0.44 - 1.00 mg/dL   Calcium 7.5 (L) 8.9 - 10.3 mg/dL   Total Protein 5.9 (L) 6.5 - 8.1 g/dL   Albumin 3.0 (L) 3.5 - 5.0 g/dL   AST 34  15 - 41 U/L   ALT 21 14 - 54 U/L   Alkaline Phosphatase 62 38 - 126 U/L   Total Bilirubin 0.8 0.3 - 1.2 mg/dL   GFR calc non Af Amer >60 >60 mL/min   GFR calc Af Amer >60 >60 mL/min    Comment: (NOTE) The eGFR has been calculated using the CKD EPI equation. This calculation has not been validated in all clinical situations. eGFR's persistently <60 mL/min signify possible Chronic Kidney Disease.    Anion gap 13 5 - 15  Urinalysis, Routine w reflex microscopic     Status: None   Collection Time: 07/23/2016 12:31 PM  Result Value Ref Range   Color, Urine YELLOW YELLOW   APPearance CLEAR CLEAR   Specific Gravity, Urine 1.008 1.005 - 1.030   pH 7.0 5.0 - 8.0   Glucose, UA NEGATIVE NEGATIVE mg/dL   Hgb urine dipstick NEGATIVE NEGATIVE   Bilirubin Urine NEGATIVE NEGATIVE   Ketones, ur NEGATIVE NEGATIVE mg/dL   Protein, ur NEGATIVE NEGATIVE mg/dL   Nitrite NEGATIVE NEGATIVE   Leukocytes, UA NEGATIVE NEGATIVE  POC occult blood, ED      Status: Abnormal   Collection Time: 08/11/2016 12:43 PM  Result Value Ref Range   Fecal Occult Bld POSITIVE (A) NEGATIVE  I-Stat CG4 Lactic Acid, ED     Status: Abnormal   Collection Time: 08/14/2016  1:14 PM  Result Value Ref Range   Lactic Acid, Venous 2.32 (HH) 0.5 - 1.9 mmol/L   Comment NOTIFIED PHYSICIAN   Basic metabolic panel     Status: Abnormal   Collection Time: 07/30/2016  1:41 PM  Result Value Ref Range   Sodium 115 (LL) 135 - 145 mmol/L    Comment: CRITICAL RESULT CALLED TO, READ BACK BY AND VERIFIED WITH: CREWES,M AT 1420 ON 4.3.18 BY ISLEY,B    Potassium <2.0 (LL) 3.5 - 5.1 mmol/L    Comment: CRITICAL RESULT CALLED TO, READ BACK BY AND VERIFIED WITH: CREWES,M AT 1420 ON 4.3.18 BY ISLEY,B    Chloride 73 (L) 101 - 111 mmol/L   CO2 30 22 - 32 mmol/L   Glucose, Bld 114 (H) 65 - 99 mg/dL   BUN 12 6 - 20 mg/dL   Creatinine, Ser 0.68 0.44 - 1.00 mg/dL   Calcium 7.7 (L) 8.9 - 10.3 mg/dL   GFR calc non Af Amer >60 >60 mL/min   GFR calc Af Amer >60 >60 mL/min    Comment: (NOTE) The eGFR has been calculated using the CKD EPI equation. This calculation has not been validated in all clinical situations. eGFR's persistently <60 mL/min signify possible Chronic Kidney Disease.    Anion gap 12 5 - 15  Magnesium     Status: Abnormal   Collection Time: 07/29/2016  1:41 PM  Result Value Ref Range   Magnesium 1.2 (L) 1.7 - 2.4 mg/dL  Basic metabolic panel     Status: Abnormal   Collection Time: 08/11/2016  3:24 PM  Result Value Ref Range   Sodium 116 (LL) 135 - 145 mmol/L    Comment: CRITICAL RESULT CALLED TO, READ BACK BY AND VERIFIED WITH: CONNOR,C ON 07/28/2016 AT 1625 BY LOY,C    Potassium 2.0 (LL) 3.5 - 5.1 mmol/L    Comment: CRITICAL RESULT CALLED TO, READ BACK BY AND VERIFIED WITH: CONNOR,C ON 08/11/2016 AT 1625 BY LOY,C    Chloride 75 (L) 101 - 111 mmol/L   CO2 29 22 - 32 mmol/L   Glucose, Bld 118 (H)  65 - 99 mg/dL   BUN 10 6 - 20 mg/dL   Creatinine, Ser 0.58 0.44 - 1.00 mg/dL    Calcium 7.3 (L) 8.9 - 10.3 mg/dL   GFR calc non Af Amer >60 >60 mL/min   GFR calc Af Amer >60 >60 mL/min    Comment: (NOTE) The eGFR has been calculated using the CKD EPI equation. This calculation has not been validated in all clinical situations. eGFR's persistently <60 mL/min signify possible Chronic Kidney Disease.    Anion gap 12 5 - 15  Basic metabolic panel     Status: Abnormal   Collection Time: 07/20/2016  5:32 PM  Result Value Ref Range   Sodium 117 (LL) 135 - 145 mmol/L    Comment: CRITICAL RESULT CALLED TO, READ BACK BY AND VERIFIED WITH: MCGIBBONY,C ON 08/17/2016 AT 1900 BY LOY,C    Potassium <2.0 (LL) 3.5 - 5.1 mmol/L    Comment: CRITICAL RESULT CALLED TO, READ BACK BY AND VERIFIED WITH: MCGIBBONY,C ON 08/01/2016 AT 1900 BY LOY,C    Chloride 76 (L) 101 - 111 mmol/L   CO2 29 22 - 32 mmol/L   Glucose, Bld 122 (H) 65 - 99 mg/dL   BUN 10 6 - 20 mg/dL   Creatinine, Ser 0.62 0.44 - 1.00 mg/dL   Calcium 7.4 (L) 8.9 - 10.3 mg/dL   GFR calc non Af Amer >60 >60 mL/min   GFR calc Af Amer >60 >60 mL/min    Comment: (NOTE) The eGFR has been calculated using the CKD EPI equation. This calculation has not been validated in all clinical situations. eGFR's persistently <60 mL/min signify possible Chronic Kidney Disease.    Anion gap 12 5 - 15  MRSA PCR Screening     Status: Abnormal   Collection Time: 08/16/2016  5:36 PM  Result Value Ref Range   MRSA by PCR POSITIVE (A) NEGATIVE    Comment:        The GeneXpert MRSA Assay (FDA approved for NASAL specimens only), is one component of a comprehensive MRSA colonization surveillance program. It is not intended to diagnose MRSA infection nor to guide or monitor treatment for MRSA infections. RESULT CALLED TO, READ BACK BY AND VERIFIED WITH:  DANIELS,J @ 0017 ON 07/23/16 BY JUW   Sodium, urine, random     Status: None   Collection Time: 08/14/2016  6:00 PM  Result Value Ref Range   Sodium, Ur 52 mmol/L  Troponin I (q 6hr x 3)      Status: None   Collection Time: 07/20/2016  6:18 PM  Result Value Ref Range   Troponin I <0.03 <0.03 ng/mL  Basic metabolic panel     Status: Abnormal   Collection Time: 08/16/2016  7:27 PM  Result Value Ref Range   Sodium 118 (LL) 135 - 145 mmol/L    Comment: CRITICAL RESULT CALLED TO, READ BACK BY AND VERIFIED WITH: ROBERTS,T AT 2020 ON 4.3.18 BY ISLEY,B    Potassium 2.0 (LL) 3.5 - 5.1 mmol/L    Comment: CRITICAL RESULT CALLED TO, READ BACK BY AND VERIFIED WITH: ROBERTS,T AT 2020 ON 4.3.18 BY ISLEY,B    Chloride 77 (L) 101 - 111 mmol/L   CO2 31 22 - 32 mmol/L   Glucose, Bld 123 (H) 65 - 99 mg/dL   BUN 9 6 - 20 mg/dL   Creatinine, Ser 0.67 0.44 - 1.00 mg/dL   Calcium 7.4 (L) 8.9 - 10.3 mg/dL   GFR calc non Af Amer >60 >60  mL/min   GFR calc Af Amer >60 >60 mL/min    Comment: (NOTE) The eGFR has been calculated using the CKD EPI equation. This calculation has not been validated in all clinical situations. eGFR's persistently <60 mL/min signify possible Chronic Kidney Disease.    Anion gap 10 5 - 15  Lactic acid, plasma     Status: Abnormal   Collection Time: 08/09/2016  7:27 PM  Result Value Ref Range   Lactic Acid, Venous 2.2 (HH) 0.5 - 1.9 mmol/L    Comment: CRITICAL RESULT CALLED TO, READ BACK BY AND VERIFIED WITH: ROBERTS,T AT 2020 ON 4.3.2018 BY ISLEY.B   Phosphorus     Status: Abnormal   Collection Time: 07/28/2016  8:00 PM  Result Value Ref Range   Phosphorus 2.3 (L) 2.5 - 4.6 mg/dL  Basic metabolic panel     Status: Abnormal   Collection Time: 07/31/2016 10:13 PM  Result Value Ref Range   Sodium 115 (LL) 135 - 145 mmol/L    Comment: CRITICAL RESULT CALLED TO, READ BACK BY AND VERIFIED WITH: DANIELS,JA T 2300 ON 4.3.18 BY ISLEY,B    Potassium 2.5 (LL) 3.5 - 5.1 mmol/L    Comment: CRITICAL RESULT CALLED TO, READ BACK BY AND VERIFIED WITH: DANIELS,JA T 2300 ON 4.3.18 BY ISLEY,B    Chloride 78 (L) 101 - 111 mmol/L   CO2 26 22 - 32 mmol/L   Glucose, Bld 153 (H) 65 - 99  mg/dL   BUN 8 6 - 20 mg/dL   Creatinine, Ser 0.57 0.44 - 1.00 mg/dL   Calcium 7.5 (L) 8.9 - 10.3 mg/dL   GFR calc non Af Amer >60 >60 mL/min   GFR calc Af Amer >60 >60 mL/min    Comment: (NOTE) The eGFR has been calculated using the CKD EPI equation. This calculation has not been validated in all clinical situations. eGFR's persistently <60 mL/min signify possible Chronic Kidney Disease.    Anion gap 11 5 - 15  Troponin I (q 6hr x 3)     Status: None   Collection Time: 07/23/16 12:44 AM  Result Value Ref Range   Troponin I <0.03 <0.03 ng/mL  Basic metabolic panel     Status: Abnormal   Collection Time: 07/23/16  2:07 AM  Result Value Ref Range   Sodium 117 (LL) 135 - 145 mmol/L    Comment: CRITICAL RESULT CALLED TO, READ BACK BY AND VERIFIED WITH:  ROBERTS,T @ 0236 ON 07/23/16 BY JUW    Potassium 2.5 (LL) 3.5 - 5.1 mmol/L    Comment: CRITICAL RESULT CALLED TO, READ BACK BY AND VERIFIED WITH:  ROBERTS,T @ 0236 ON 07/23/16 BY JUW    Chloride 79 (L) 101 - 111 mmol/L   CO2 24 22 - 32 mmol/L   Glucose, Bld 124 (H) 65 - 99 mg/dL   BUN 6 6 - 20 mg/dL   Creatinine, Ser 0.65 0.44 - 1.00 mg/dL   Calcium 7.9 (L) 8.9 - 10.3 mg/dL   GFR calc non Af Amer >60 >60 mL/min   GFR calc Af Amer >60 >60 mL/min    Comment: (NOTE) The eGFR has been calculated using the CKD EPI equation. This calculation has not been validated in all clinical situations. eGFR's persistently <60 mL/min signify possible Chronic Kidney Disease.    Anion gap 14 5 - 15  CBC     Status: Abnormal   Collection Time: 07/23/16  4:13 AM  Result Value Ref Range   WBC 14.8 (H) 4.0 -  10.5 K/uL   RBC 4.52 3.87 - 5.11 MIL/uL   Hemoglobin 13.9 12.0 - 15.0 g/dL   HCT 38.0 36.0 - 46.0 %   MCV 84.1 78.0 - 100.0 fL   MCH 30.8 26.0 - 34.0 pg   MCHC 36.6 (H) 30.0 - 36.0 g/dL   RDW 12.7 11.5 - 15.5 %   Platelets 327 150 - 400 K/uL  Basic metabolic panel     Status: Abnormal   Collection Time: 07/23/16  4:13 AM  Result Value Ref  Range   Sodium 118 (LL) 135 - 145 mmol/L    Comment: CRITICAL RESULT CALLED TO, READ BACK BY AND VERIFIED WITH: ROBERTS,T AT 5:00AM ON 07/23/16 BY FESTERMAN,C    Potassium 2.8 (L) 3.5 - 5.1 mmol/L   Chloride 82 (L) 101 - 111 mmol/L   CO2 25 22 - 32 mmol/L   Glucose, Bld 117 (H) 65 - 99 mg/dL   BUN 6 6 - 20 mg/dL   Creatinine, Ser 0.58 0.44 - 1.00 mg/dL   Calcium 7.7 (L) 8.9 - 10.3 mg/dL   GFR calc non Af Amer >60 >60 mL/min   GFR calc Af Amer >60 >60 mL/min    Comment: (NOTE) The eGFR has been calculated using the CKD EPI equation. This calculation has not been validated in all clinical situations. eGFR's persistently <60 mL/min signify possible Chronic Kidney Disease.    Anion gap 11 5 - 15  Troponin I (q 6hr x 3)     Status: None   Collection Time: 07/23/16  6:36 AM  Result Value Ref Range   Troponin I <0.03 <0.03 ng/mL  Basic metabolic panel     Status: Abnormal   Collection Time: 07/23/16  6:36 AM  Result Value Ref Range   Sodium 120 (L) 135 - 145 mmol/L   Potassium 2.8 (L) 3.5 - 5.1 mmol/L   Chloride 82 (L) 101 - 111 mmol/L   CO2 26 22 - 32 mmol/L   Glucose, Bld 125 (H) 65 - 99 mg/dL   BUN 6 6 - 20 mg/dL   Creatinine, Ser 0.53 0.44 - 1.00 mg/dL   Calcium 7.8 (L) 8.9 - 10.3 mg/dL   GFR calc non Af Amer >60 >60 mL/min   GFR calc Af Amer >60 >60 mL/min    Comment: (NOTE) The eGFR has been calculated using the CKD EPI equation. This calculation has not been validated in all clinical situations. eGFR's persistently <60 mL/min signify possible Chronic Kidney Disease.    Anion gap 12 5 - 15  Basic metabolic panel     Status: Abnormal   Collection Time: 07/23/16  8:07 AM  Result Value Ref Range   Sodium 121 (L) 135 - 145 mmol/L   Potassium 2.7 (LL) 3.5 - 5.1 mmol/L    Comment: CRITICAL RESULT CALLED TO, READ BACK BY AND VERIFIED WITH: HYLTON,L AT 8:50AM ON 07/23/16 BY FESTERMAN,C    Chloride 82 (L) 101 - 111 mmol/L   CO2 28 22 - 32 mmol/L   Glucose, Bld 137 (H) 65  - 99 mg/dL   BUN 5 (L) 6 - 20 mg/dL   Creatinine, Ser 0.63 0.44 - 1.00 mg/dL   Calcium 7.8 (L) 8.9 - 10.3 mg/dL   GFR calc non Af Amer >60 >60 mL/min   GFR calc Af Amer >60 >60 mL/min    Comment: (NOTE) The eGFR has been calculated using the CKD EPI equation. This calculation has not been validated in all clinical situations. eGFR's persistently <60  mL/min signify possible Chronic Kidney Disease.    Anion gap 11 5 - 15  Basic metabolic panel     Status: Abnormal   Collection Time: 07/23/16 10:14 AM  Result Value Ref Range   Sodium 120 (L) 135 - 145 mmol/L   Potassium 3.0 (L) 3.5 - 5.1 mmol/L   Chloride 81 (L) 101 - 111 mmol/L   CO2 27 22 - 32 mmol/L   Glucose, Bld 149 (H) 65 - 99 mg/dL   BUN 5 (L) 6 - 20 mg/dL   Creatinine, Ser 0.63 0.44 - 1.00 mg/dL   Calcium 7.8 (L) 8.9 - 10.3 mg/dL   GFR calc non Af Amer >60 >60 mL/min   GFR calc Af Amer >60 >60 mL/min    Comment: (NOTE) The eGFR has been calculated using the CKD EPI equation. This calculation has not been validated in all clinical situations. eGFR's persistently <60 mL/min signify possible Chronic Kidney Disease.    Anion gap 12 5 - 15    Ct Head Wo Contrast  Result Date: 08/06/2016 CLINICAL DATA:  83 year old hypertensive Saunders less active for the past week. Possible seizure. Initial encounter. EXAM: CT HEAD WITHOUT CONTRAST TECHNIQUE: Contiguous axial images were obtained from the base of the skull through the vertex without intravenous contrast. COMPARISON:  09/25/2010. FINDINGS: Brain: No intracranial hemorrhage or CT evidence of large acute infarct. Moderate chronic microvascular changes. Global atrophy without hydrocephalus. No intracranial mass lesion noted on this unenhanced exam. Vascular: Vascular calcifications. Skull: No acute abnormality. Sinuses/Orbits: Post lens replacement without acute orbital abnormality. Complete opacification left maxillary sinus. Other: Negative. IMPRESSION: No intracranial hemorrhage or  CT evidence of large acute infarct. Moderate chronic microvascular changes. Global atrophy. Complete opacification left maxillary sinus. Electronically Signed   By: Genia Del M.D.   On: 08/15/2016 12:32   Dg Chest Portable 1 View  Result Date: 08/02/2016 CLINICAL DATA:  Sepsis.  Altered mental status.  COPD. EXAM: PORTABLE CHEST 1 VIEW COMPARISON:  Chest x-ray dated 07/10/2010 FINDINGS: Heart size is within normal limits. Prominent left pericardial fat pad. Pulmonary vascularity is normal. Interstitial markings are accentuated due to a shallow inspiration. No acute infiltrates or effusions. Spinal cord stimulator in place. No acute bone abnormality. Large hiatal hernia. Calcification in the arch of the aorta. IMPRESSION: No acute abnormalities.  Large hiatal hernia. Aortic atherosclerosis. Electronically Signed   By: Lorriane Shire M.D.   On: 08/05/2016 10:59    ROS Blood pressure 122/71, pulse (!) 109, temperature 98.4 F (36.9 C), temperature source Axillary, resp. rate (!) 41, height _0  (1.626 m), weight 166 lb 3.6 oz (75.4 kg), SpO2 99 %. Physical Exam Responds to verbal stimuli.  Skin warm and dry. Oral mucosa is moist.   . Sclera anicteric, conjunctivae is pink. Thyroid not enlarged. No cervical lymphadenopathy. Lungs clear. Heart regular rate and rhythm.  Abdomen is soft. Bowel sounds are positive. No hepatomegaly. No abdominal masses felt. No tenderness.  Foley draining clear urine.   Assessmen/Plan Rectal bleed. Last colonoscopy in 2014. She is maintaining her hemoglobin. Will discuss with Dr Laural Golden.  Lisa Saunders W 07/23/2016, 11:07 AM

## 2016-07-23 NOTE — Consult Note (Signed)
Lisa Lakes A. [name], MD     [email or web address]          Lisa Saunders is an 83 y.o. female.   ASSESSMENT/PLAN: 1. Status epilepticus: Etiology is unknown at this time. The patient will be given and dose of Ativan now and also given a loading dose of Keppra 1000 mg. We'll also start a maintenance dose of thousand milligrams twice a day. We will continue with Dilantin and check level tomorrow. An EEG will be obtained. (The concern about Keppra possibly associated with hyponatremia does not outweigh the great benefit of the medication)  Repeat imaging of the brain with MRI with and the without contrast will be done. I want to hold on this until we get her status epilepticus controlled however.  Hyponatremia   The patient 83 year old white female who presents to the hospital blood in the stool. She was noted to have hyponatremia 114. It appears that she had one seizure which was consistent with a generalized tonoclonic seizure. She had apparently a couple afterwords. She was loaded with Keppra and then started on Dilantin. It appears that the Keppra was discontinued because of concern of hyponatremia. It appears she has had several seizures. She was given Ativan this morning. She now seems to have more focal seizures with head turning towards the right and twitching. The nurse reports that she is having numerous of these essentially the continuous state of seizures. She has these events but comes post ictal and then seems to have another event. She become less and less unresponsive.  GENERAL: Mostly unresponsive: She about 3 events noted throughout the evaluation. She turns her head towards the right with tonic movements of the head and neck and eyes deviated to the right with rhythmic nystagmoid movements of the eyes.  HEENT: Supple. Atraumatic normocephalic.   ABDOMEN: soft  EXTREMITIES: No edema   BACK: Normal.  SKIN: Normal by inspection.    MENTAL STATUS:  She is unresponsive but after an event she warms eyes to painful stimuli. She does focuses and tracks but does not follow commands. There is a little bit of groaning but no other verbal output.  CRANIAL NERVES: Pupils are equal, round and reactive to light; extra ocular movements are full, there is no significant nystagmus; visual fields are  - unreliable; upper and lower facial muscles are normal in strength and symmetric, there is no flattening of the nasolabial folds.  MOTOR: She has movements to painful stimuli but slightly above gravity in the upper extremities and 2/5 in the legs.  COORDINATION: Left finger to nose is normal, right finger to nose is normal, No rest tremor; no intention tremor; no postural tremor; no bradykinesia.  REFLEXES: Deep tendon reflexes are symmetrical and normal.   SENSATION: She responds to deep painful stimuli bilaterally.      Blood pressure 131/68, pulse (!) 140, temperature 97.7 F (36.5 C), temperature source Axillary, resp. rate (!) 25, height '5\' 4"'$  (1.626 m), weight 166 lb 3.6 oz (75.4 kg), SpO2 92 %.  Past Medical History:  Diagnosis Date  . Anemia   . Anxiety   . Back pain   . Chronic cystitis   . Chronic pain   . Complication of anesthesia    pt states"I was in a coma for 5 days after my 3rd back surgery and no one knows why". husband states she hasnt been right since then,doesnt walk right or anything" Dr Durene Cal was MD  . COPD (chronic obstructive pulmonary disease) ([place])   .  Depression   . Fibromyalgia   . Hypertension   . Mixed incontinence     Past Surgical History:  Procedure Laterality Date  . ABDOMINAL HYSTERECTOMY    . APPENDECTOMY    . back surgery x 4    . CARDIAC CATHETERIZATION  11/2002   w/o signficant  (Dr. Gerrie Nordmann)  . CATARACT EXTRACTION Right   . CATARACT EXTRACTION W/PHACO Left 07/19/2012   Procedure: CATARACT EXTRACTION PHACO AND INTRAOCULAR LENS PLACEMENT (IOC);  Surgeon: Williams Che, MD;  Location: [facility];  Service: Ophthalmology;  Laterality: Left;  CDE 12.60  . CHOLECYSTECTOMY    . COLONOSCOPY WITH PROPOFOL N/A 03/21/2013   Procedure: COLONOSCOPY WITH PROPOFOL;  Surgeon: Rogene Houston, MD;  Location: [facility];  Service: Endoscopy;  Laterality: N/A;  In cecum at 751, out at 0802 = 11 minutes total time  . KNEE SURGERY    . POLYPECTOMY N/A 03/21/2013   Procedure: POLYPECTOMY;  Surgeon: Rogene Houston, MD;  Location: [facility];  Service: Endoscopy;  Laterality: N/A;  . SPINAL CORD STIMULATOR IMPLANT    . TONSILLECTOMY    . TRANSTHORACIC ECHOCARDIOGRAM  11/16/2012   EF 13-24%, grade 2 diastolic dysfunction; LA severely dilated; mod TR;     Family History  Problem Relation Age of Onset  . Heart failure Mother   . Heart attack Father     Social History:  reports that she quit smoking about 52 years ago. She has never used smokeless tobacco. She reports that she does not drink alcohol or use drugs.  Allergies:  Allergies  Allergen Reactions  . Morphine     COMA  . Prednisone     Makes me crazy and mean  . Procardia [Nifedipine] Other (See Comments)    unknown  . Latex Rash    Medications: Prior to Admission medications   Medication Sig Start Date End Date Taking? Authorizing Provider  ADVAIR DISKUS 250-50 MCG/DOSE AEPB Inhale 1 puff into the lungs 2 (two) times daily.  06/09/11  Yes Historical Provider, MD  albuterol (PROVENTIL HFA;VENTOLIN HFA) 108 (90 BASE) MCG/ACT inhaler Inhale 2 puffs into the lungs 2 (two) times daily as needed for shortness of breath. For shortness of breath   Yes Historical Provider, MD  ALPRAZolam Duanne Moron) 1 MG tablet Take 1 mg by mouth 4 (four) times daily as needed for anxiety. For anxiety 06/25/11  Yes Historical Provider, MD  aspirin 81 MG tablet Take 81 mg by mouth daily.   Yes Historical Provider, MD  budesonide-formoterol (SYMBICORT) 80-4.5 MCG/ACT inhaler Inhale 2 puffs into the lungs 2 (two) times daily.   Yes Historical Provider, MD  calcitonin,  salmon, (MIACALCIN/FORTICAL) 200 UNIT/ACT nasal spray Place 1 spray into the nose daily.   Yes Historical Provider, MD  Calcium Carbonate-Vitamin D (CALTRATE 600+D PO) Take 1 tablet by mouth daily.    Yes Historical Provider, MD  cephALEXin (KEFLEX) 500 MG capsule Take 1 capsule (500 mg total) by mouth 4 (four) times daily. 02/19/16  Yes Francine Graven, DO  cholecalciferol (VITAMIN D) 1000 UNITS tablet Take 1,000 Units by mouth daily.   Yes Historical Provider, MD  Cranberry 360 MG CAPS Take 300 mg by mouth daily.   Yes Historical Provider, MD  Docusate Calcium (STOOL SOFTENER PO) Take 100 mg by mouth 2 (two) times daily. 03/07/14  Yes Historical Provider, MD  ferrous sulfate 325 (65 FE) MG tablet Take 325 mg by mouth 2 (two) times daily after a meal.  Yes Historical Provider, MD  FLUoxetine (PROZAC) 20 MG tablet Take 20 mg by mouth daily.   Yes Historical Provider, MD  furosemide (LASIX) 40 MG tablet Take 40 mg by mouth 2 (two) times daily.  06/23/11  Yes Historical Provider, MD  Garlic 6010 MG CAPS Take 1 capsule by mouth daily.   Yes Historical Provider, MD  Incontinence Supply Disposable (BLADDER CONTROL PADS EX ABSORB) [place]  03/06/14  Yes Historical Provider, MD  KLOR-CON M20 20 MEQ tablet Take 20 mEq by mouth 2 (two) times daily.  04/29/11  Yes Historical Provider, MD  metolazone (ZAROXOLYN) 2.5 MG tablet Take 1 tablet by mouth daily. 06/23/16  Yes Historical Provider, MD  metoprolol (LOPRESSOR) 50 MG tablet Take 50 mg by mouth 2 (two) times daily.   Yes Historical Provider, MD  oxybutynin (DITROPAN) 5 MG tablet Take 5 mg by mouth 2 (two) times daily. Reported on 06/26/2015   Yes Historical Provider, MD  oxyCODONE (ROXICODONE) 15 MG immediate release tablet Take 1-2 tablets by mouth every 4 (four) hours. 05/22/16  Yes Historical Provider, MD  Oxycodone HCl 10 MG TABS Take 10 mg by mouth every 4 (four) hours as needed for severe pain. Take a max of 2 tablets per day with percocet for severe pain.     Yes Historical Provider, MD  risperiDONE (RISPERDAL) 0.5 MG tablet Take 1 tablet by mouth daily. 07/09/16  Yes Historical Provider, MD  traMADol (ULTRAM) 50 MG tablet Take 50 mg by mouth 4 (four) times daily.   Yes Historical Provider, MD  vitamin B-12 (CYANOCOBALAMIN) 1000 MCG tablet Take 1,000 mcg by mouth daily.   Yes Historical Provider, MD  zolpidem (AMBIEN) 10 MG tablet Take 10 mg by mouth at bedtime as needed for sleep.    Yes Historical Provider, MD  Multiple Vitamins-Minerals (HAIR SKIN AND NAILS FORMULA PO) Take 1 tablet by mouth 2 (two) times daily.     Historical Provider, MD  oxyCODONE-acetaminophen (PERCOCET) 10-325 MG tablet Take 1 tablet by mouth every 4 (four) hours as needed for pain.     Historical Provider, MD  pantoprazole (PROTONIX) 40 MG tablet TAKE ONE TABLET BY MOUTH ONCE DAILY BEFORE SUPPER. Patient not taking: Reported on 08/07/2016 11/08/15   Rogene Houston, MD  psyllium (REGULOID) 0.52 g capsule Take 1.04 g by mouth daily.    Historical Provider, MD    Scheduled Meds: . calcitonin (salmon)  1 spray Alternating Nares Daily  . chlorhexidine  15 mL Mouth Rinse BID  . mouth rinse  15 mL Mouth Rinse q12n4p  . phenytoin (DILANTIN) IV  100 mg Intravenous Q8H  . piperacillin-tazobactam (ZOSYN)  IV  3.375 g Intravenous Q8H  . potassium chloride  40 mEq Oral Once  . vancomycin  1,000 mg Intravenous Q12H   Continuous Infusions: . 0.9 % NaCl with KCl 40 mEq / L 135 mL/hr (07/23/16 1809)   PRN Meds:.albuterol     Results for orders placed or performed during the hospital encounter of 07/25/2016 (from the past 48 hour(s))  Comprehensive metabolic panel     Status: Abnormal   Collection Time: 08/12/2016 10:24 AM  Result Value Ref Range   Sodium 114 (LL) 135 - 145 mmol/L    Comment: CRITICAL RESULT CALLED TO, READ BACK BY AND VERIFIED WITH: PATROW,B AT 11:05AM ON 08/18/2016 BY FESTERMAN,C    Potassium <2.0 (LL) 3.5 - 5.1 mmol/L    Comment: CRITICAL RESULT CALLED TO, READ  BACK BY AND VERIFIED WITH: PATROW,B AT 11:05AM  ON 08/08/2016 BY FESTERMAN,C    Chloride <65 (LL) 101 - 111 mmol/L    Comment: CRITICAL RESULT CALLED TO, READ BACK BY AND VERIFIED WITH: PATROW,B AT 11:05AM ON 07/21/2016 BY FESTERMAN,C    CO2 29 22 - 32 mmol/L   Glucose, Bld 118 (H) 65 - 99 mg/dL   BUN 15 6 - 20 mg/dL   Creatinine, Ser 0.84 0.44 - 1.00 mg/dL   Calcium 8.6 (L) 8.9 - 10.3 mg/dL   Total Protein 7.3 6.5 - 8.1 g/dL   Albumin 3.8 3.5 - 5.0 g/dL   AST 47 (H) 15 - 41 U/L   ALT 26 14 - 54 U/L   Alkaline Phosphatase 79 38 - 126 U/L   Total Bilirubin 1.4 (H) 0.3 - 1.2 mg/dL   GFR calc non Af Amer >60 >60 mL/min   GFR calc Af Amer >60 >60 mL/min    Comment: (NOTE) The eGFR has been calculated using the CKD EPI equation. This calculation has not been validated in all clinical situations. eGFR's persistently <60 mL/min signify possible Chronic Kidney Disease.   CBC WITH DIFFERENTIAL     Status: Abnormal   Collection Time: 08/05/2016 10:24 AM  Result Value Ref Range   WBC 16.8 (H) 4.0 - 10.5 K/uL   RBC 4.77 3.87 - 5.11 MIL/uL   Hemoglobin 14.6 12.0 - 15.0 g/dL   HCT 40.3 36.0 - 46.0 %   MCV 84.5 78.0 - 100.0 fL   MCH 30.6 26.0 - 34.0 pg   MCHC 36.2 (H) 30.0 - 36.0 g/dL   RDW 12.3 11.5 - 15.5 %   Platelets 348 150 - 400 K/uL   Neutrophils Relative % 83 %   Neutro Abs 13.9 (H) 1.7 - 7.7 K/uL   Lymphocytes Relative 9 %   Lymphs Abs 1.6 0.7 - 4.0 K/uL   Monocytes Relative 8 %   Monocytes Absolute 1.3 (H) 0.1 - 1.0 K/uL   Eosinophils Relative 0 %   Eosinophils Absolute 0.1 0.0 - 0.7 K/uL   Basophils Relative 0 %   Basophils Absolute 0.0 0.0 - 0.1 K/uL  Blood Culture (routine x 2)     Status: None (Preliminary result)   Collection Time: 08/01/2016 10:24 AM  Result Value Ref Range   Specimen Description BLOOD RIGHT FOREARM    Special Requests      BOTTLES DRAWN AEROBIC AND ANAEROBIC Blood Culture adequate volume   Culture NO GROWTH < 24 HOURS    Report Status PENDING   Type  and screen     Status: None   Collection Time: 07/25/2016 10:24 AM  Result Value Ref Range   ABO/RH(D) O POS    Antibody Screen NEG    Sample Expiration 07/25/2016   Protime-INR     Status: None   Collection Time: 08/18/2016 10:24 AM  Result Value Ref Range   Prothrombin Time 14.8 11.4 - 15.2 seconds   INR 1.15   TSH     Status: None   Collection Time: 08/10/2016 10:24 AM  Result Value Ref Range   TSH 1.599 0.350 - 4.500 uIU/mL    Comment: Performed by a 3rd Generation assay with a functional sensitivity of <=0.01 uIU/mL.  Urinalysis, Routine w reflex microscopic     Status: None   Collection Time: 08/09/2016 10:25 AM  Result Value Ref Range   Color, Urine YELLOW YELLOW   APPearance CLEAR CLEAR   Specific Gravity, Urine 1.005 1.005 - 1.030   pH 7.0 5.0 - 8.0  Glucose, UA NEGATIVE NEGATIVE mg/dL   Hgb urine dipstick NEGATIVE NEGATIVE   Bilirubin Urine NEGATIVE NEGATIVE   Ketones, ur NEGATIVE NEGATIVE mg/dL   Protein, ur NEGATIVE NEGATIVE mg/dL   Nitrite NEGATIVE NEGATIVE   Leukocytes, UA NEGATIVE NEGATIVE  Osmolality     Status: Abnormal   Collection Time: 08/11/2016 10:30 AM  Result Value Ref Range   Osmolality 235 (LL) 275 - 295 mOsm/kg    Comment: CRITICAL RESULT CALLED TO, READ BACK BY AND VERIFIED WITH: ROBERTS,T RN 2146 08/09/2016 MITCHELL,L PERFORMED AT [facility] Performed at [facility], [place] [address] [place], [place] [address]   I-Stat CG4 Lactic Acid, ED  (not at  [facility])     Status: Abnormal   Collection Time: 08/11/2016 10:36 AM  Result Value Ref Range   Lactic Acid, Venous 8.32 (HH) 0.5 - 1.9 mmol/L   Comment NOTIFIED PHYSICIAN   Blood Culture (routine x 2)     Status: None (Preliminary result)   Collection Time: 07/21/2016 10:36 AM  Result Value Ref Range   Specimen Description BLOOD RIGHT HAND    Special Requests      BOTTLES DRAWN AEROBIC ONLY Blood Culture results may not be optimal due to an inadequate volume of blood received in culture bottles    Culture NO GROWTH < 24 HOURS    Report Status PENDING   I-stat troponin, ED     Status: None   Collection Time: 08/11/2016 10:51 AM  Result Value Ref Range   Troponin i, poc 0.01 0.00 - 0.08 ng/mL   Comment 3            Comment: Due to the release kinetics of cTnI, a negative result within the first hours of the onset of symptoms does not rule out myocardial infarction with certainty. If myocardial infarction is still suspected, repeat the test at appropriate intervals.   Comprehensive metabolic panel     Status: Abnormal   Collection Time: 08/13/2016 11:33 AM  Result Value Ref Range   Sodium 112 (LL) 135 - 145 mmol/L    Comment: CRITICAL RESULT CALLED TO, READ BACK BY AND VERIFIED WITH: WHITE,M AT 12:05PM ON 08/02/2016 BY FESTERMAN,C    Potassium <2.0 (LL) 3.5 - 5.1 mmol/L    Comment: CRITICAL RESULT CALLED TO, READ BACK BY AND VERIFIED WITH: WHITE,M AT 12:05PM ON 08/03/2016 BY FESTERMAN,C    Chloride 69 (L) 101 - 111 mmol/L   CO2 30 22 - 32 mmol/L   Glucose, Bld 126 (H) 65 - 99 mg/dL   BUN 14 6 - 20 mg/dL   Creatinine, Ser 0.73 0.44 - 1.00 mg/dL   Calcium 7.5 (L) 8.9 - 10.3 mg/dL   Total Protein 5.9 (L) 6.5 - 8.1 g/dL   Albumin 3.0 (L) 3.5 - 5.0 g/dL   AST 34 15 - 41 U/L   ALT 21 14 - 54 U/L   Alkaline Phosphatase 62 38 - 126 U/L   Total Bilirubin 0.8 0.3 - 1.2 mg/dL   GFR calc non Af Amer >60 >60 mL/min   GFR calc Af Amer >60 >60 mL/min    Comment: (NOTE) The eGFR has been calculated using the CKD EPI equation. This calculation has not been validated in all clinical situations. eGFR's persistently <60 mL/min signify possible Chronic Kidney Disease.    Anion gap 13 5 - 15  Urinalysis, Routine w reflex microscopic     Status: None   Collection Time: 07/29/2016 12:31 PM  Result Value Ref  Range   Color, Urine YELLOW YELLOW   APPearance CLEAR CLEAR   Specific Gravity, Urine 1.008 1.005 - 1.030   pH 7.0 5.0 - 8.0   Glucose, UA NEGATIVE NEGATIVE mg/dL   Hgb urine dipstick  NEGATIVE NEGATIVE   Bilirubin Urine NEGATIVE NEGATIVE   Ketones, ur NEGATIVE NEGATIVE mg/dL   Protein, ur NEGATIVE NEGATIVE mg/dL   Nitrite NEGATIVE NEGATIVE   Leukocytes, UA NEGATIVE NEGATIVE  POC occult blood, ED     Status: Abnormal   Collection Time: 07/21/2016 12:43 PM  Result Value Ref Range   Fecal Occult Bld POSITIVE (A) NEGATIVE  I-Stat CG4 Lactic Acid, ED     Status: Abnormal   Collection Time: 08/18/2016  1:14 PM  Result Value Ref Range   Lactic Acid, Venous 2.32 (HH) 0.5 - 1.9 mmol/L   Comment NOTIFIED PHYSICIAN   Osmolality, urine     Status: Abnormal   Collection Time: 08/06/2016  1:40 PM  Result Value Ref Range   Osmolality, Ur 275 (L) 300 - 900 mOsm/kg    Comment: Performed at [facility] [address] [place], [place] [address]  Basic metabolic panel     Status: Abnormal   Collection Time: 08/16/2016  1:41 PM  Result Value Ref Range   Sodium 115 (LL) 135 - 145 mmol/L    Comment: CRITICAL RESULT CALLED TO, READ BACK BY AND VERIFIED WITH: CREWES,M AT 1420 ON 4.3.18 BY ISLEY,B    Potassium <2.0 (LL) 3.5 - 5.1 mmol/L    Comment: CRITICAL RESULT CALLED TO, READ BACK BY AND VERIFIED WITH: CREWES,M AT 1420 ON 4.3.18 BY ISLEY,B    Chloride 73 (L) 101 - 111 mmol/L   CO2 30 22 - 32 mmol/L   Glucose, Bld 114 (H) 65 - 99 mg/dL   BUN 12 6 - 20 mg/dL   Creatinine, Ser 0.68 0.44 - 1.00 mg/dL   Calcium 7.7 (L) 8.9 - 10.3 mg/dL   GFR calc non Af Amer >60 >60 mL/min   GFR calc Af Amer >60 >60 mL/min    Comment: (NOTE) The eGFR has been calculated using the CKD EPI equation. This calculation has not been validated in all clinical situations. eGFR's persistently <60 mL/min signify possible Chronic Kidney Disease.    Anion gap 12 5 - 15  Magnesium     Status: Abnormal   Collection Time: 07/21/2016  1:41 PM  Result Value Ref Range   Magnesium 1.2 (L) 1.7 - 2.4 mg/dL  Basic metabolic panel     Status: Abnormal   Collection Time: 07/21/2016  3:24 PM  Result Value Ref Range     Sodium 116 (LL) 135 - 145 mmol/L    Comment: CRITICAL RESULT CALLED TO, READ BACK BY AND VERIFIED WITH: CONNOR,C ON 08/16/2016 AT 1625 BY LOY,C    Potassium 2.0 (LL) 3.5 - 5.1 mmol/L    Comment: CRITICAL RESULT CALLED TO, READ BACK BY AND VERIFIED WITH: CONNOR,C ON 08/11/2016 AT 1625 BY LOY,C    Chloride 75 (L) 101 - 111 mmol/L   CO2 29 22 - 32 mmol/L   Glucose, Bld 118 (H) 65 - 99 mg/dL   BUN 10 6 - 20 mg/dL   Creatinine, Ser 0.58 0.44 - 1.00 mg/dL   Calcium 7.3 (L) 8.9 - 10.3 mg/dL   GFR calc non Af Amer >60 >60 mL/min   GFR calc Af Amer >60 >60 mL/min    Comment: (NOTE) The eGFR has been calculated using the CKD EPI equation.  This calculation has not been validated in all clinical situations. eGFR's persistently <60 mL/min signify possible Chronic Kidney Disease.    Anion gap 12 5 - 15  Basic metabolic panel     Status: Abnormal   Collection Time: 08/09/2016  5:32 PM  Result Value Ref Range   Sodium 117 (LL) 135 - 145 mmol/L    Comment: CRITICAL RESULT CALLED TO, READ BACK BY AND VERIFIED WITH: MCGIBBONY,C ON 07/25/2016 AT 1900 BY LOY,C    Potassium <2.0 (LL) 3.5 - 5.1 mmol/L    Comment: CRITICAL RESULT CALLED TO, READ BACK BY AND VERIFIED WITH: MCGIBBONY,C ON 08/03/2016 AT 1900 BY LOY,C    Chloride 76 (L) 101 - 111 mmol/L   CO2 29 22 - 32 mmol/L   Glucose, Bld 122 (H) 65 - 99 mg/dL   BUN 10 6 - 20 mg/dL   Creatinine, Ser 0.62 0.44 - 1.00 mg/dL   Calcium 7.4 (L) 8.9 - 10.3 mg/dL   GFR calc non Af Amer >60 >60 mL/min   GFR calc Af Amer >60 >60 mL/min    Comment: (NOTE) The eGFR has been calculated using the CKD EPI equation. This calculation has not been validated in all clinical situations. eGFR's persistently <60 mL/min signify possible Chronic Kidney Disease.    Anion gap 12 5 - 15  MRSA PCR Screening     Status: Abnormal   Collection Time: 07/21/2016  5:36 PM  Result Value Ref Range   MRSA by PCR POSITIVE (A) NEGATIVE    Comment:        The GeneXpert MRSA Assay  (FDA approved for NASAL specimens only), is one component of a comprehensive MRSA colonization surveillance program. It is not intended to diagnose MRSA infection nor to guide or monitor treatment for MRSA infections. RESULT CALLED TO, READ BACK BY AND VERIFIED WITH:  DANIELS,J @ 0017 ON 07/23/16 BY JUW   Sodium, urine, random     Status: None   Collection Time: 07/20/2016  6:00 PM  Result Value Ref Range   Sodium, Ur 52 mmol/L  Troponin I (q 6hr x 3)     Status: None   Collection Time: 07/27/2016  6:18 PM  Result Value Ref Range   Troponin I <0.03 <0.03 ng/mL  Basic metabolic panel     Status: Abnormal   Collection Time: 07/29/2016  7:27 PM  Result Value Ref Range   Sodium 118 (LL) 135 - 145 mmol/L    Comment: CRITICAL RESULT CALLED TO, READ BACK BY AND VERIFIED WITH: ROBERTS,T AT 2020 ON 4.3.18 BY ISLEY,B    Potassium 2.0 (LL) 3.5 - 5.1 mmol/L    Comment: CRITICAL RESULT CALLED TO, READ BACK BY AND VERIFIED WITH: ROBERTS,T AT 2020 ON 4.3.18 BY ISLEY,B    Chloride 77 (L) 101 - 111 mmol/L   CO2 31 22 - 32 mmol/L   Glucose, Bld 123 (H) 65 - 99 mg/dL   BUN 9 6 - 20 mg/dL   Creatinine, Ser 0.67 0.44 - 1.00 mg/dL   Calcium 7.4 (L) 8.9 - 10.3 mg/dL   GFR calc non Af Amer >60 >60 mL/min   GFR calc Af Amer >60 >60 mL/min    Comment: (NOTE) The eGFR has been calculated using the CKD EPI equation. This calculation has not been validated in all clinical situations. eGFR's persistently <60 mL/min signify possible Chronic Kidney Disease.    Anion gap 10 5 - 15  Lactic acid, plasma     Status: Abnormal  Collection Time: 08/03/2016  7:27 PM  Result Value Ref Range   Lactic Acid, Venous 2.2 (HH) 0.5 - 1.9 mmol/L    Comment: CRITICAL RESULT CALLED TO, READ BACK BY AND VERIFIED WITH: ROBERTS,T AT 2020 ON 4.3.2018 BY ISLEY.B   Phosphorus     Status: Abnormal   Collection Time: 07/24/2016  8:00 PM  Result Value Ref Range   Phosphorus 2.3 (L) 2.5 - 4.6 mg/dL  Basic metabolic panel      Status: Abnormal   Collection Time: 08/17/2016 10:13 PM  Result Value Ref Range   Sodium 115 (LL) 135 - 145 mmol/L    Comment: CRITICAL RESULT CALLED TO, READ BACK BY AND VERIFIED WITH: DANIELS,JA T 2300 ON 4.3.18 BY ISLEY,B    Potassium 2.5 (LL) 3.5 - 5.1 mmol/L    Comment: CRITICAL RESULT CALLED TO, READ BACK BY AND VERIFIED WITH: DANIELS,JA T 2300 ON 4.3.18 BY ISLEY,B    Chloride 78 (L) 101 - 111 mmol/L   CO2 26 22 - 32 mmol/L   Glucose, Bld 153 (H) 65 - 99 mg/dL   BUN 8 6 - 20 mg/dL   Creatinine, Ser 0.57 0.44 - 1.00 mg/dL   Calcium 7.5 (L) 8.9 - 10.3 mg/dL   GFR calc non Af Amer >60 >60 mL/min   GFR calc Af Amer >60 >60 mL/min    Comment: (NOTE) The eGFR has been calculated using the CKD EPI equation. This calculation has not been validated in all clinical situations. eGFR's persistently <60 mL/min signify possible Chronic Kidney Disease.    Anion gap 11 5 - 15  Troponin I (q 6hr x 3)     Status: None   Collection Time: 07/23/16 12:44 AM  Result Value Ref Range   Troponin I <0.03 <0.03 ng/mL  Basic metabolic panel     Status: Abnormal   Collection Time: 07/23/16  2:07 AM  Result Value Ref Range   Sodium 117 (LL) 135 - 145 mmol/L    Comment: CRITICAL RESULT CALLED TO, READ BACK BY AND VERIFIED WITH:  ROBERTS,T @ 0236 ON 07/23/16 BY JUW    Potassium 2.5 (LL) 3.5 - 5.1 mmol/L    Comment: CRITICAL RESULT CALLED TO, READ BACK BY AND VERIFIED WITH:  ROBERTS,T @ 0236 ON 07/23/16 BY JUW    Chloride 79 (L) 101 - 111 mmol/L   CO2 24 22 - 32 mmol/L   Glucose, Bld 124 (H) 65 - 99 mg/dL   BUN 6 6 - 20 mg/dL   Creatinine, Ser 0.65 0.44 - 1.00 mg/dL   Calcium 7.9 (L) 8.9 - 10.3 mg/dL   GFR calc non Af Amer >60 >60 mL/min   GFR calc Af Amer >60 >60 mL/min    Comment: (NOTE) The eGFR has been calculated using the CKD EPI equation. This calculation has not been validated in all clinical situations. eGFR's persistently <60 mL/min signify possible Chronic Kidney Disease.    Anion  gap 14 5 - 15  CBC     Status: Abnormal   Collection Time: 07/23/16  4:13 AM  Result Value Ref Range   WBC 14.8 (H) 4.0 - 10.5 K/uL   RBC 4.52 3.87 - 5.11 MIL/uL   Hemoglobin 13.9 12.0 - 15.0 g/dL   HCT 38.0 36.0 - 46.0 %   MCV 84.1 78.0 - 100.0 fL   MCH 30.8 26.0 - 34.0 pg   MCHC 36.6 (H) 30.0 - 36.0 g/dL   RDW 12.7 11.5 - 15.5 %   Platelets 327  150 - 400 K/uL  Cortisol-am, blood     Status: None   Collection Time: 07/23/16  4:13 AM  Result Value Ref Range   Cortisol - AM 20.7 6.7 - 22.6 ug/dL    Comment: Performed at [facility], [place] [address], [place], [place] [address]  Basic metabolic panel     Status: Abnormal   Collection Time: 07/23/16  4:13 AM  Result Value Ref Range   Sodium 118 (LL) 135 - 145 mmol/L    Comment: CRITICAL RESULT CALLED TO, READ BACK BY AND VERIFIED WITH: ROBERTS,T AT 5:00AM ON 07/23/16 BY FESTERMAN,C    Potassium 2.8 (L) 3.5 - 5.1 mmol/L   Chloride 82 (L) 101 - 111 mmol/L   CO2 25 22 - 32 mmol/L   Glucose, Bld 117 (H) 65 - 99 mg/dL   BUN 6 6 - 20 mg/dL   Creatinine, Ser 0.58 0.44 - 1.00 mg/dL   Calcium 7.7 (L) 8.9 - 10.3 mg/dL   GFR calc non Af Amer >60 >60 mL/min   GFR calc Af Amer >60 >60 mL/min    Comment: (NOTE) The eGFR has been calculated using the CKD EPI equation. This calculation has not been validated in all clinical situations. eGFR's persistently <60 mL/min signify possible Chronic Kidney Disease.    Anion gap 11 5 - 15  Troponin I (q 6hr x 3)     Status: None   Collection Time: 07/23/16  6:36 AM  Result Value Ref Range   Troponin I <0.03 <0.03 ng/mL  Basic metabolic panel     Status: Abnormal   Collection Time: 07/23/16  6:36 AM  Result Value Ref Range   Sodium 120 (L) 135 - 145 mmol/L   Potassium 2.8 (L) 3.5 - 5.1 mmol/L   Chloride 82 (L) 101 - 111 mmol/L   CO2 26 22 - 32 mmol/L   Glucose, Bld 125 (H) 65 - 99 mg/dL   BUN 6 6 - 20 mg/dL   Creatinine, Ser 0.53 0.44 - 1.00 mg/dL   Calcium 7.8 (L) 8.9 - 10.3 mg/dL    GFR calc non Af Amer >60 >60 mL/min   GFR calc Af Amer >60 >60 mL/min    Comment: (NOTE) The eGFR has been calculated using the CKD EPI equation. This calculation has not been validated in all clinical situations. eGFR's persistently <60 mL/min signify possible Chronic Kidney Disease.    Anion gap 12 5 - 15  Basic metabolic panel     Status: Abnormal   Collection Time: 07/23/16  8:07 AM  Result Value Ref Range   Sodium 121 (L) 135 - 145 mmol/L   Potassium 2.7 (LL) 3.5 - 5.1 mmol/L    Comment: CRITICAL RESULT CALLED TO, READ BACK BY AND VERIFIED WITH: HYLTON,L AT 8:50AM ON 07/23/16 BY FESTERMAN,C    Chloride 82 (L) 101 - 111 mmol/L   CO2 28 22 - 32 mmol/L   Glucose, Bld 137 (H) 65 - 99 mg/dL   BUN 5 (L) 6 - 20 mg/dL   Creatinine, Ser 0.63 0.44 - 1.00 mg/dL   Calcium 7.8 (L) 8.9 - 10.3 mg/dL   GFR calc non Af Amer >60 >60 mL/min   GFR calc Af Amer >60 >60 mL/min    Comment: (NOTE) The eGFR has been calculated using the CKD EPI equation. This calculation has not been validated in all clinical situations. eGFR's persistently <60 mL/min signify possible Chronic Kidney Disease.    Anion gap 11 5 - 15  Basic metabolic panel     Status: Abnormal   Collection Time: 07/23/16 10:14 AM  Result Value Ref Range   Sodium 120 (L) 135 - 145 mmol/L   Potassium 3.0 (L) 3.5 - 5.1 mmol/L   Chloride 81 (L) 101 - 111 mmol/L   CO2 27 22 - 32 mmol/L   Glucose, Bld 149 (H) 65 - 99 mg/dL   BUN 5 (L) 6 - 20 mg/dL   Creatinine, Ser 0.63 0.44 - 1.00 mg/dL   Calcium 7.8 (L) 8.9 - 10.3 mg/dL   GFR calc non Af Amer >60 >60 mL/min   GFR calc Af Amer >60 >60 mL/min    Comment: (NOTE) The eGFR has been calculated using the CKD EPI equation. This calculation has not been validated in all clinical situations. eGFR's persistently <60 mL/min signify possible Chronic Kidney Disease.    Anion gap 12 5 - 15    Studies/Results:    HEAD CT FINDINGS: Brain: No intracranial hemorrhage or CT evidence of  large acute infarct.  Moderate chronic microvascular changes.  Global atrophy without hydrocephalus.  No intracranial mass lesion noted on this unenhanced exam.  Vascular: Vascular calcifications.  Skull: No acute abnormality.  Sinuses/Orbits: Post lens replacement without acute orbital abnormality.  Complete opacification left maxillary sinus.  Other: Negative.  IMPRESSION: No intracranial hemorrhage or CT evidence of large acute infarct.  Moderate chronic microvascular changes.  Global atrophy.  Complete opacification left maxillary sinus.       Head CT scan is  Moderate global atrophy. There is also severe periventricular confluent white matter changes. There may be a focal area of reduced signal involving the anterior temporal region on the right side. No clear cortical infarcts or acute changes however.     Critical care time 45 min   Salvador Bigbee A. Merlene Saunders, M.D.  Diplomate, Tax adviser of Psychiatry and Neurology ( Neurology). 07/23/2016, 8:25 PM

## 2016-07-23 NOTE — Progress Notes (Signed)
LCSW aware of consult:  Possibly new placement: SNF.  At this time, patient is not appropriate for consult/PT consult to address for placement due to medical acuity. Palliative care/GOC has been consulted as well. Will follow up with [facility] meeting and recommendations regarding disposition after discussion.   Lane Hacker, MSW Clinical Social Work: [employer] for :  [phone]

## 2016-07-23 NOTE — Progress Notes (Signed)
Patient has had a mostly uneventful day today. Family has been visiting a lot with her today and has been very supportive of nurses and the care that patient was receiving. Patient is now a DNR and family has talked to palliative care today about their options for treatment going forward. There is a meeting scheduled tomorrow with the sons and husband to discuss this. Patient has had a few very small seizures today that have lasted 15-30 seconds in duration with just head twitching and eyes deviating to the right. She is leaning to the left and I have supported her with pillows on that side. Patient has had frequent mouth care by myself and family. They are very accepting of making her comfort care but as of this time no official decisions have been made. Patient does not seem to be in any pain per her vital signs, she is not able to communicate and has been like that since this morning. She has only been able to open her eyes and occasionally look in my direction when I talk to her but she did not track with her eyes at all.

## 2016-07-23 NOTE — Progress Notes (Signed)
Witnessed pt having seizure at 0435am for 30 seconds, 0443 am for 10 seconds, 0447 am for 55 seconds and 0458 for 16 seconds. MD notified and he told me to hold giving her ativan. Also critical value is Na+ 118  Another seizure at 0502  MD now at bedside at [facility]. Ordered dilantin  Lisa Gironda Rica Mote, RN 5:07 AM

## 2016-07-23 NOTE — Consult Note (Signed)
Reason for Consult: Hyponatremia and hypokalemia Referring Physician: Dr. Isaac Bliss  Lisa Saunders is an 83 y.o. female.  HPI: She is a patient was history of hypertension, a trial fibrillation, severe degenerative joint disease status post spinal cord stimulator placement, history of depression presently was brought by family because of altered mental status and also hematuria. When patient was in emergency room she had episode of seizure. He was oh patient does not have any seizure disorder she has an episode of seizure in 2012 when she was admitted to the hospital for back surgery. During that time the etiology was thought to be secondary to possible benzo diazepam withdrawal. Her seizure improved and during current discharge was not put on any medications. Presently most of the information I got is from her chart and also husband.  Past Medical History:  Diagnosis Date  . Anemia   . Anxiety   . Back pain   . Chronic cystitis   . Chronic pain   . Complication of anesthesia    pt states"I was in a coma for 5 days after my 3rd back surgery and no one knows why". husband states she hasnt been right since then,doesnt walk right or anything" Dr Durene Cal was MD  . COPD (chronic obstructive pulmonary disease) ([place])   . Depression   . Fibromyalgia   . Hypertension   . Mixed incontinence     Past Surgical History:  Procedure Laterality Date  . ABDOMINAL HYSTERECTOMY    . APPENDECTOMY    . back surgery x 4    . CARDIAC CATHETERIZATION  11/2002   w/o signficant  (Dr. Gerrie Nordmann)  . CATARACT EXTRACTION Right   . CATARACT EXTRACTION W/PHACO Left 07/19/2012   Procedure: CATARACT EXTRACTION PHACO AND INTRAOCULAR LENS PLACEMENT (IOC);  Surgeon: Williams Che, MD;  Location: [facility];  Service: Ophthalmology;  Laterality: Left;  CDE 12.60  . CHOLECYSTECTOMY    . COLONOSCOPY WITH PROPOFOL N/A 03/21/2013   Procedure: COLONOSCOPY WITH PROPOFOL;  Surgeon: Rogene Houston, MD;  Location: [facility];  Service: Endoscopy;  Laterality: N/A;  In cecum at 751, out at 0802 = 11 minutes total time  . KNEE SURGERY    . POLYPECTOMY N/A 03/21/2013   Procedure: POLYPECTOMY;  Surgeon: Rogene Houston, MD;  Location: [facility];  Service: Endoscopy;  Laterality: N/A;  . SPINAL CORD STIMULATOR IMPLANT    . TONSILLECTOMY    . TRANSTHORACIC ECHOCARDIOGRAM  11/16/2012   EF 16-10%, grade 2 diastolic dysfunction; LA severely dilated; mod TR;     Family History  Problem Relation Age of Onset  . Heart failure Mother   . Heart attack Father     Social History:  reports that she quit smoking about 52 years ago. She has never used smokeless tobacco. She reports that she does not drink alcohol or use drugs.  Allergies:  Allergies  Allergen Reactions  . Morphine     COMA  . Prednisone     Makes me crazy and mean  . Procardia [Nifedipine] Other (See Comments)    unknown  . Latex Rash    Medications: I have reviewed the patient's current medications.  Results for orders placed or performed during the hospital encounter of 07/25/2016 (from the past 48 hour(s))  Comprehensive metabolic panel     Status: Abnormal   Collection Time: 08/08/2016 10:24 AM  Result Value Ref Range   Sodium 114 (LL) 135 - 145 mmol/L    Comment: CRITICAL RESULT  CALLED TO, READ BACK BY AND VERIFIED WITH: PATROW,B AT 11:05AM ON 08/02/2016 BY FESTERMAN,C    Potassium <2.0 (LL) 3.5 - 5.1 mmol/L    Comment: CRITICAL RESULT CALLED TO, READ BACK BY AND VERIFIED WITH: PATROW,B AT 11:05AM ON 07/31/2016 BY FESTERMAN,C    Chloride <65 (LL) 101 - 111 mmol/L    Comment: CRITICAL RESULT CALLED TO, READ BACK BY AND VERIFIED WITH: PATROW,B AT 11:05AM ON 07/20/2016 BY FESTERMAN,C    CO2 29 22 - 32 mmol/L   Glucose, Bld 118 (H) 65 - 99 mg/dL   BUN 15 6 - 20 mg/dL   Creatinine, Ser 0.84 0.44 - 1.00 mg/dL   Calcium 8.6 (L) 8.9 - 10.3 mg/dL   Total Protein 7.3 6.5 - 8.1 g/dL   Albumin 3.8 3.5 - 5.0 g/dL   AST 47 (H) 15 - 41 U/L   ALT 26 14 -  54 U/L   Alkaline Phosphatase 79 38 - 126 U/L   Total Bilirubin 1.4 (H) 0.3 - 1.2 mg/dL   GFR calc non Af Amer >60 >60 mL/min   GFR calc Af Amer >60 >60 mL/min    Comment: (NOTE) The eGFR has been calculated using the CKD EPI equation. This calculation has not been validated in all clinical situations. eGFR's persistently <60 mL/min signify possible Chronic Kidney Disease.   CBC WITH DIFFERENTIAL     Status: Abnormal   Collection Time: 08/08/2016 10:24 AM  Result Value Ref Range   WBC 16.8 (H) 4.0 - 10.5 K/uL   RBC 4.77 3.87 - 5.11 MIL/uL   Hemoglobin 14.6 12.0 - 15.0 g/dL   HCT 40.3 36.0 - 46.0 %   MCV 84.5 78.0 - 100.0 fL   MCH 30.6 26.0 - 34.0 pg   MCHC 36.2 (H) 30.0 - 36.0 g/dL   RDW 12.3 11.5 - 15.5 %   Platelets 348 150 - 400 K/uL   Neutrophils Relative % 83 %   Neutro Abs 13.9 (H) 1.7 - 7.7 K/uL   Lymphocytes Relative 9 %   Lymphs Abs 1.6 0.7 - 4.0 K/uL   Monocytes Relative 8 %   Monocytes Absolute 1.3 (H) 0.1 - 1.0 K/uL   Eosinophils Relative 0 %   Eosinophils Absolute 0.1 0.0 - 0.7 K/uL   Basophils Relative 0 %   Basophils Absolute 0.0 0.0 - 0.1 K/uL  Blood Culture (routine x 2)     Status: None (Preliminary result)   Collection Time: 08/02/2016 10:24 AM  Result Value Ref Range   Specimen Description BLOOD RIGHT FOREARM    Special Requests      BOTTLES DRAWN AEROBIC AND ANAEROBIC Blood Culture adequate volume   Culture NO GROWTH < 24 HOURS    Report Status PENDING   Type and screen     Status: None   Collection Time: 08/03/2016 10:24 AM  Result Value Ref Range   ABO/RH(D) O POS    Antibody Screen NEG    Sample Expiration 07/25/2016   Protime-INR     Status: None   Collection Time: 07/28/2016 10:24 AM  Result Value Ref Range   Prothrombin Time 14.8 11.4 - 15.2 seconds   INR 1.15   TSH     Status: None   Collection Time: 08/18/2016 10:24 AM  Result Value Ref Range   TSH 1.599 0.350 - 4.500 uIU/mL    Comment: Performed by a 3rd Generation assay with a functional  sensitivity of <=0.01 uIU/mL.  Urinalysis, Routine w reflex microscopic  Status: None   Collection Time: 07/20/2016 10:25 AM  Result Value Ref Range   Color, Urine YELLOW YELLOW   APPearance CLEAR CLEAR   Specific Gravity, Urine 1.005 1.005 - 1.030   pH 7.0 5.0 - 8.0   Glucose, UA NEGATIVE NEGATIVE mg/dL   Hgb urine dipstick NEGATIVE NEGATIVE   Bilirubin Urine NEGATIVE NEGATIVE   Ketones, ur NEGATIVE NEGATIVE mg/dL   Protein, ur NEGATIVE NEGATIVE mg/dL   Nitrite NEGATIVE NEGATIVE   Leukocytes, UA NEGATIVE NEGATIVE  Osmolality     Status: Abnormal   Collection Time: 08/06/2016 10:30 AM  Result Value Ref Range   Osmolality 235 (LL) 275 - 295 mOsm/kg    Comment: CRITICAL RESULT CALLED TO, READ BACK BY AND VERIFIED WITH: ROBERTS,T RN 2146 07/25/2016 MITCHELL,L PERFORMED AT [facility] Performed at [facility], [address] [place], [place] [address]   I-Stat CG4 Lactic Acid, ED  (not at  [facility])     Status: Abnormal   Collection Time: 08/01/2016 10:36 AM  Result Value Ref Range   Lactic Acid, Venous 8.32 (HH) 0.5 - 1.9 mmol/L   Comment NOTIFIED PHYSICIAN   Blood Culture (routine x 2)     Status: None (Preliminary result)   Collection Time: 08/07/2016 10:36 AM  Result Value Ref Range   Specimen Description BLOOD RIGHT HAND    Special Requests      BOTTLES DRAWN AEROBIC ONLY Blood Culture results may not be optimal due to an inadequate volume of blood received in culture bottles   Culture NO GROWTH < 24 HOURS    Report Status PENDING   I-stat troponin, ED     Status: None   Collection Time: 08/18/2016 10:51 AM  Result Value Ref Range   Troponin i, poc 0.01 0.00 - 0.08 ng/mL   Comment 3            Comment: Due to the release kinetics of cTnI, a negative result within the first hours of the onset of symptoms does not rule out myocardial infarction with certainty. If myocardial infarction is still suspected, repeat the test at appropriate intervals.   Comprehensive  metabolic panel     Status: Abnormal   Collection Time: 07/21/2016 11:33 AM  Result Value Ref Range   Sodium 112 (LL) 135 - 145 mmol/L    Comment: CRITICAL RESULT CALLED TO, READ BACK BY AND VERIFIED WITH: WHITE,M AT 12:05PM ON 08/02/2016 BY FESTERMAN,C    Potassium <2.0 (LL) 3.5 - 5.1 mmol/L    Comment: CRITICAL RESULT CALLED TO, READ BACK BY AND VERIFIED WITH: WHITE,M AT 12:05PM ON 08/13/2016 BY FESTERMAN,C    Chloride 69 (L) 101 - 111 mmol/L   CO2 30 22 - 32 mmol/L   Glucose, Bld 126 (H) 65 - 99 mg/dL   BUN 14 6 - 20 mg/dL   Creatinine, Ser 0.73 0.44 - 1.00 mg/dL   Calcium 7.5 (L) 8.9 - 10.3 mg/dL   Total Protein 5.9 (L) 6.5 - 8.1 g/dL   Albumin 3.0 (L) 3.5 - 5.0 g/dL   AST 34 15 - 41 U/L   ALT 21 14 - 54 U/L   Alkaline Phosphatase 62 38 - 126 U/L   Total Bilirubin 0.8 0.3 - 1.2 mg/dL   GFR calc non Af Amer >60 >60 mL/min   GFR calc Af Amer >60 >60 mL/min    Comment: (NOTE) The eGFR has been calculated using the CKD EPI equation. This calculation has not been validated in all  clinical situations. eGFR's persistently <60 mL/min signify possible Chronic Kidney Disease.    Anion gap 13 5 - 15  Urinalysis, Routine w reflex microscopic     Status: None   Collection Time: 08/10/2016 12:31 PM  Result Value Ref Range   Color, Urine YELLOW YELLOW   APPearance CLEAR CLEAR   Specific Gravity, Urine 1.008 1.005 - 1.030   pH 7.0 5.0 - 8.0   Glucose, UA NEGATIVE NEGATIVE mg/dL   Hgb urine dipstick NEGATIVE NEGATIVE   Bilirubin Urine NEGATIVE NEGATIVE   Ketones, ur NEGATIVE NEGATIVE mg/dL   Protein, ur NEGATIVE NEGATIVE mg/dL   Nitrite NEGATIVE NEGATIVE   Leukocytes, UA NEGATIVE NEGATIVE  POC occult blood, ED     Status: Abnormal   Collection Time: 07/25/2016 12:43 PM  Result Value Ref Range   Fecal Occult Bld POSITIVE (A) NEGATIVE  I-Stat CG4 Lactic Acid, ED     Status: Abnormal   Collection Time: 07/28/2016  1:14 PM  Result Value Ref Range   Lactic Acid, Venous 2.32 (HH) 0.5 - 1.9  mmol/L   Comment NOTIFIED PHYSICIAN   Basic metabolic panel     Status: Abnormal   Collection Time: 07/21/2016  1:41 PM  Result Value Ref Range   Sodium 115 (LL) 135 - 145 mmol/L    Comment: CRITICAL RESULT CALLED TO, READ BACK BY AND VERIFIED WITH: CREWES,M AT 1420 ON 4.3.18 BY ISLEY,B    Potassium <2.0 (LL) 3.5 - 5.1 mmol/L    Comment: CRITICAL RESULT CALLED TO, READ BACK BY AND VERIFIED WITH: CREWES,M AT 1420 ON 4.3.18 BY ISLEY,B    Chloride 73 (L) 101 - 111 mmol/L   CO2 30 22 - 32 mmol/L   Glucose, Bld 114 (H) 65 - 99 mg/dL   BUN 12 6 - 20 mg/dL   Creatinine, Ser 0.68 0.44 - 1.00 mg/dL   Calcium 7.7 (L) 8.9 - 10.3 mg/dL   GFR calc non Af Amer >60 >60 mL/min   GFR calc Af Amer >60 >60 mL/min    Comment: (NOTE) The eGFR has been calculated using the CKD EPI equation. This calculation has not been validated in all clinical situations. eGFR's persistently <60 mL/min signify possible Chronic Kidney Disease.    Anion gap 12 5 - 15  Magnesium     Status: Abnormal   Collection Time: 07/25/2016  1:41 PM  Result Value Ref Range   Magnesium 1.2 (L) 1.7 - 2.4 mg/dL  Basic metabolic panel     Status: Abnormal   Collection Time: 07/23/2016  3:24 PM  Result Value Ref Range   Sodium 116 (LL) 135 - 145 mmol/L    Comment: CRITICAL RESULT CALLED TO, READ BACK BY AND VERIFIED WITH: CONNOR,C ON 07/25/2016 AT 1625 BY LOY,C    Potassium 2.0 (LL) 3.5 - 5.1 mmol/L    Comment: CRITICAL RESULT CALLED TO, READ BACK BY AND VERIFIED WITH: CONNOR,C ON 07/30/2016 AT 1625 BY LOY,C    Chloride 75 (L) 101 - 111 mmol/L   CO2 29 22 - 32 mmol/L   Glucose, Bld 118 (H) 65 - 99 mg/dL   BUN 10 6 - 20 mg/dL   Creatinine, Ser 0.58 0.44 - 1.00 mg/dL   Calcium 7.3 (L) 8.9 - 10.3 mg/dL   GFR calc non Af Amer >60 >60 mL/min   GFR calc Af Amer >60 >60 mL/min    Comment: (NOTE) The eGFR has been calculated using the CKD EPI equation. This calculation has not been validated in all  clinical situations. eGFR's persistently  <60 mL/min signify possible Chronic Kidney Disease.    Anion gap 12 5 - 15  Basic metabolic panel     Status: Abnormal   Collection Time: 08/06/2016  5:32 PM  Result Value Ref Range   Sodium 117 (LL) 135 - 145 mmol/L    Comment: CRITICAL RESULT CALLED TO, READ BACK BY AND VERIFIED WITH: MCGIBBONY,C ON 07/20/2016 AT 1900 BY LOY,C    Potassium <2.0 (LL) 3.5 - 5.1 mmol/L    Comment: CRITICAL RESULT CALLED TO, READ BACK BY AND VERIFIED WITH: MCGIBBONY,C ON 07/23/2016 AT 1900 BY LOY,C    Chloride 76 (L) 101 - 111 mmol/L   CO2 29 22 - 32 mmol/L   Glucose, Bld 122 (H) 65 - 99 mg/dL   BUN 10 6 - 20 mg/dL   Creatinine, Ser 0.62 0.44 - 1.00 mg/dL   Calcium 7.4 (L) 8.9 - 10.3 mg/dL   GFR calc non Af Amer >60 >60 mL/min   GFR calc Af Amer >60 >60 mL/min    Comment: (NOTE) The eGFR has been calculated using the CKD EPI equation. This calculation has not been validated in all clinical situations. eGFR's persistently <60 mL/min signify possible Chronic Kidney Disease.    Anion gap 12 5 - 15  MRSA PCR Screening     Status: Abnormal   Collection Time: 08/18/2016  5:36 PM  Result Value Ref Range   MRSA by PCR POSITIVE (A) NEGATIVE    Comment:        The GeneXpert MRSA Assay (FDA approved for NASAL specimens only), is one component of a comprehensive MRSA colonization surveillance program. It is not intended to diagnose MRSA infection nor to guide or monitor treatment for MRSA infections. RESULT CALLED TO, READ BACK BY AND VERIFIED WITH:  DANIELS,J @ 0017 ON 07/23/16 BY JUW   Sodium, urine, random     Status: None   Collection Time: 08/09/2016  6:00 PM  Result Value Ref Range   Sodium, Ur 52 mmol/L  Troponin I (q 6hr x 3)     Status: None   Collection Time: 08/01/2016  6:18 PM  Result Value Ref Range   Troponin I <0.03 <0.03 ng/mL  Basic metabolic panel     Status: Abnormal   Collection Time: 08/02/2016  7:27 PM  Result Value Ref Range   Sodium 118 (LL) 135 - 145 mmol/L    Comment: CRITICAL  RESULT CALLED TO, READ BACK BY AND VERIFIED WITH: ROBERTS,T AT 2020 ON 4.3.18 BY ISLEY,B    Potassium 2.0 (LL) 3.5 - 5.1 mmol/L    Comment: CRITICAL RESULT CALLED TO, READ BACK BY AND VERIFIED WITH: ROBERTS,T AT 2020 ON 4.3.18 BY ISLEY,B    Chloride 77 (L) 101 - 111 mmol/L   CO2 31 22 - 32 mmol/L   Glucose, Bld 123 (H) 65 - 99 mg/dL   BUN 9 6 - 20 mg/dL   Creatinine, Ser 0.67 0.44 - 1.00 mg/dL   Calcium 7.4 (L) 8.9 - 10.3 mg/dL   GFR calc non Af Amer >60 >60 mL/min   GFR calc Af Amer >60 >60 mL/min    Comment: (NOTE) The eGFR has been calculated using the CKD EPI equation. This calculation has not been validated in all clinical situations. eGFR's persistently <60 mL/min signify possible Chronic Kidney Disease.    Anion gap 10 5 - 15  Lactic acid, plasma     Status: Abnormal   Collection Time: 08/01/2016  7:27 PM  Result Value Ref Range   Lactic Acid, Venous 2.2 (HH) 0.5 - 1.9 mmol/L    Comment: CRITICAL RESULT CALLED TO, READ BACK BY AND VERIFIED WITH: ROBERTS,T AT 2020 ON 4.3.2018 BY ISLEY.B   Phosphorus     Status: Abnormal   Collection Time: 07/21/2016  8:00 PM  Result Value Ref Range   Phosphorus 2.3 (L) 2.5 - 4.6 mg/dL  Basic metabolic panel     Status: Abnormal   Collection Time: 08/17/2016 10:13 PM  Result Value Ref Range   Sodium 115 (LL) 135 - 145 mmol/L    Comment: CRITICAL RESULT CALLED TO, READ BACK BY AND VERIFIED WITH: DANIELS,JA T 2300 ON 4.3.18 BY ISLEY,B    Potassium 2.5 (LL) 3.5 - 5.1 mmol/L    Comment: CRITICAL RESULT CALLED TO, READ BACK BY AND VERIFIED WITH: DANIELS,JA T 2300 ON 4.3.18 BY ISLEY,B    Chloride 78 (L) 101 - 111 mmol/L   CO2 26 22 - 32 mmol/L   Glucose, Bld 153 (H) 65 - 99 mg/dL   BUN 8 6 - 20 mg/dL   Creatinine, Ser 0.57 0.44 - 1.00 mg/dL   Calcium 7.5 (L) 8.9 - 10.3 mg/dL   GFR calc non Af Amer >60 >60 mL/min   GFR calc Af Amer >60 >60 mL/min    Comment: (NOTE) The eGFR has been calculated using the CKD EPI equation. This calculation  has not been validated in all clinical situations. eGFR's persistently <60 mL/min signify possible Chronic Kidney Disease.    Anion gap 11 5 - 15  Troponin I (q 6hr x 3)     Status: None   Collection Time: 07/23/16 12:44 AM  Result Value Ref Range   Troponin I <0.03 <0.03 ng/mL  Basic metabolic panel     Status: Abnormal   Collection Time: 07/23/16  2:07 AM  Result Value Ref Range   Sodium 117 (LL) 135 - 145 mmol/L    Comment: CRITICAL RESULT CALLED TO, READ BACK BY AND VERIFIED WITH:  ROBERTS,T @ 0236 ON 07/23/16 BY JUW    Potassium 2.5 (LL) 3.5 - 5.1 mmol/L    Comment: CRITICAL RESULT CALLED TO, READ BACK BY AND VERIFIED WITH:  ROBERTS,T @ 0236 ON 07/23/16 BY JUW    Chloride 79 (L) 101 - 111 mmol/L   CO2 24 22 - 32 mmol/L   Glucose, Bld 124 (H) 65 - 99 mg/dL   BUN 6 6 - 20 mg/dL   Creatinine, Ser 0.65 0.44 - 1.00 mg/dL   Calcium 7.9 (L) 8.9 - 10.3 mg/dL   GFR calc non Af Amer >60 >60 mL/min   GFR calc Af Amer >60 >60 mL/min    Comment: (NOTE) The eGFR has been calculated using the CKD EPI equation. This calculation has not been validated in all clinical situations. eGFR's persistently <60 mL/min signify possible Chronic Kidney Disease.    Anion gap 14 5 - 15  CBC     Status: Abnormal   Collection Time: 07/23/16  4:13 AM  Result Value Ref Range   WBC 14.8 (H) 4.0 - 10.5 K/uL   RBC 4.52 3.87 - 5.11 MIL/uL   Hemoglobin 13.9 12.0 - 15.0 g/dL   HCT 38.0 36.0 - 46.0 %   MCV 84.1 78.0 - 100.0 fL   MCH 30.8 26.0 - 34.0 pg   MCHC 36.6 (H) 30.0 - 36.0 g/dL   RDW 12.7 11.5 - 15.5 %   Platelets 327 150 - 400 K/uL  Basic metabolic panel  Status: Abnormal   Collection Time: 07/23/16  4:13 AM  Result Value Ref Range   Sodium 118 (LL) 135 - 145 mmol/L    Comment: CRITICAL RESULT CALLED TO, READ BACK BY AND VERIFIED WITH: ROBERTS,T AT 5:00AM ON 07/23/16 BY FESTERMAN,C    Potassium 2.8 (L) 3.5 - 5.1 mmol/L   Chloride 82 (L) 101 - 111 mmol/L   CO2 25 22 - 32 mmol/L   Glucose,  Bld 117 (H) 65 - 99 mg/dL   BUN 6 6 - 20 mg/dL   Creatinine, Ser 0.58 0.44 - 1.00 mg/dL   Calcium 7.7 (L) 8.9 - 10.3 mg/dL   GFR calc non Af Amer >60 >60 mL/min   GFR calc Af Amer >60 >60 mL/min    Comment: (NOTE) The eGFR has been calculated using the CKD EPI equation. This calculation has not been validated in all clinical situations. eGFR's persistently <60 mL/min signify possible Chronic Kidney Disease.    Anion gap 11 5 - 15  Troponin I (q 6hr x 3)     Status: None   Collection Time: 07/23/16  6:36 AM  Result Value Ref Range   Troponin I <0.03 <0.03 ng/mL  Basic metabolic panel     Status: Abnormal   Collection Time: 07/23/16  6:36 AM  Result Value Ref Range   Sodium 120 (L) 135 - 145 mmol/L   Potassium 2.8 (L) 3.5 - 5.1 mmol/L   Chloride 82 (L) 101 - 111 mmol/L   CO2 26 22 - 32 mmol/L   Glucose, Bld 125 (H) 65 - 99 mg/dL   BUN 6 6 - 20 mg/dL   Creatinine, Ser 0.53 0.44 - 1.00 mg/dL   Calcium 7.8 (L) 8.9 - 10.3 mg/dL   GFR calc non Af Amer >60 >60 mL/min   GFR calc Af Amer >60 >60 mL/min    Comment: (NOTE) The eGFR has been calculated using the CKD EPI equation. This calculation has not been validated in all clinical situations. eGFR's persistently <60 mL/min signify possible Chronic Kidney Disease.    Anion gap 12 5 - 15  Basic metabolic panel     Status: Abnormal   Collection Time: 07/23/16  8:07 AM  Result Value Ref Range   Sodium 121 (L) 135 - 145 mmol/L   Potassium 2.7 (LL) 3.5 - 5.1 mmol/L    Comment: CRITICAL RESULT CALLED TO, READ BACK BY AND VERIFIED WITH: HYLTON,L AT 8:50AM ON 07/23/16 BY FESTERMAN,C    Chloride 82 (L) 101 - 111 mmol/L   CO2 28 22 - 32 mmol/L   Glucose, Bld 137 (H) 65 - 99 mg/dL   BUN 5 (L) 6 - 20 mg/dL   Creatinine, Ser 0.63 0.44 - 1.00 mg/dL   Calcium 7.8 (L) 8.9 - 10.3 mg/dL   GFR calc non Af Amer >60 >60 mL/min   GFR calc Af Amer >60 >60 mL/min    Comment: (NOTE) The eGFR has been calculated using the CKD EPI equation. This  calculation has not been validated in all clinical situations. eGFR's persistently <60 mL/min signify possible Chronic Kidney Disease.    Anion gap 11 5 - 15  Basic metabolic panel     Status: Abnormal   Collection Time: 07/23/16 10:14 AM  Result Value Ref Range   Sodium 120 (L) 135 - 145 mmol/L   Potassium 3.0 (L) 3.5 - 5.1 mmol/L   Chloride 81 (L) 101 - 111 mmol/L   CO2 27 22 - 32 mmol/L   Glucose,  Bld 149 (H) 65 - 99 mg/dL   BUN 5 (L) 6 - 20 mg/dL   Creatinine, Ser 0.63 0.44 - 1.00 mg/dL   Calcium 7.8 (L) 8.9 - 10.3 mg/dL   GFR calc non Af Amer >60 >60 mL/min   GFR calc Af Amer >60 >60 mL/min    Comment: (NOTE) The eGFR has been calculated using the CKD EPI equation. This calculation has not been validated in all clinical situations. eGFR's persistently <60 mL/min signify possible Chronic Kidney Disease.    Anion gap 12 5 - 15    Ct Head Wo Contrast  Result Date: 08/18/2016 CLINICAL DATA:  83 year old hypertensive female less active for the past week. Possible seizure. Initial encounter. EXAM: CT HEAD WITHOUT CONTRAST TECHNIQUE: Contiguous axial images were obtained from the base of the skull through the vertex without intravenous contrast. COMPARISON:  09/25/2010. FINDINGS: Brain: No intracranial hemorrhage or CT evidence of large acute infarct. Moderate chronic microvascular changes. Global atrophy without hydrocephalus. No intracranial mass lesion noted on this unenhanced exam. Vascular: Vascular calcifications. Skull: No acute abnormality. Sinuses/Orbits: Post lens replacement without acute orbital abnormality. Complete opacification left maxillary sinus. Other: Negative. IMPRESSION: No intracranial hemorrhage or CT evidence of large acute infarct. Moderate chronic microvascular changes. Global atrophy. Complete opacification left maxillary sinus. Electronically Signed   By: Genia Del M.D.   On: 07/25/2016 12:32   Dg Chest Portable 1 View  Result Date: 08/02/2016 CLINICAL  DATA:  Sepsis.  Altered mental status.  COPD. EXAM: PORTABLE CHEST 1 VIEW COMPARISON:  Chest x-ray dated 07/10/2010 FINDINGS: Heart size is within normal limits. Prominent left pericardial fat pad. Pulmonary vascularity is normal. Interstitial markings are accentuated due to a shallow inspiration. No acute infiltrates or effusions. Spinal cord stimulator in place. No acute bone abnormality. Large hiatal hernia. Calcification in the arch of the aorta. IMPRESSION: No acute abnormalities.  Large hiatal hernia. Aortic atherosclerosis. Electronically Signed   By: Lorriane Shire M.D.   On: 08/04/2016 10:59    Review of Systems  Unable to perform ROS: Mental status change  Constitutional: Negative for malaise/fatigue.   Blood pressure 112/73, pulse (!) 130, temperature 98.4 F (36.9 C), temperature source Axillary, resp. rate (!) 21, height '5\' 4"'$  (1.626 m), weight 75.4 kg (166 lb 3.6 oz), SpO2 97 %. Physical Exam  Constitutional: No distress.  Neck: No JVD present.  Cardiovascular: Normal rate and regular rhythm.   Respiratory: No respiratory distress. She has no wheezes.  GI: She exhibits no distension. There is no tenderness.  Musculoskeletal: She exhibits no edema.  Neurological: She is alert.  Patient is alert and in no apparent distress. Presently patient doesn't answer questions. She shakes her head to say yes or no.    Assessment/Plan: Problem #1 hyponatremia: Seems to be secondary to hypovolemic hyponatremia. Patient was on diuretics for CHF. She had episode of multiple seizures last night even though her sodium was 115. As stated above. Patient has previous history of seizure disorder thought to be secondary to medication withdrawal. The presence seizure could be aggravated because of her hyponatremia. Her sodium presently has improved. Patient does not have any seizure activity. Problem #2 hypokalemia: Her potassium is low. Most likely from diuretics. Problem #3 history of diastolic  dysfunction: Presently patient doesn't have any sign of fluid overload. Problem #4 history of COPD Problem #5 history of atrial fibrillation Problem #6 history of urethral tract infection: Patient was treated with antibiotics as an outpatient. Presently she is a febrile but  her white blood cell count is high. Patient is on antibiotics. Problem #7 chronic back pain: Status post a spinal cord stimulator implant. Presently is nonfunctioning Problem #8 history of hypertension: Her blood pressure is reasonably controlled. Plan: 1]ncrease IV fluid to 1 35 mL per hour 2] with magnesium supplement 3] Will check magnesium level and renal panel in the morning  [facility] S 07/23/2016, 11:32 AM

## 2016-07-23 NOTE — Progress Notes (Signed)
FOLLOW UP:  She had several short breakthrough seizures, requiring IV ativan.  During her Sz, her afib became transiently with RVR, but normalized quickly. She is now post ictal, and sedated with IV ativan. Since she has several short lived seizures, I will load her with Dilantin 1000mg  IV. Her Na rose to 117 at 2am, and 118 at 4am. Will continue IV hypertonic saline until Na is above 120, then use NA. Lasix x 1 given to prevent volume overload.  Her K was still low, and she was given 4 more IV Boluses.  Calcium is normal. I called her daughter at 5:30am to update her, and all questions answered.  She is in congruent with these therapeutic interventions. Neurology should see her this am.  Thanks,  Orvan Falconer MD FACP. Hospitalist.

## 2016-07-24 ENCOUNTER — Inpatient Hospital Stay (HOSPITAL_COMMUNITY): Payer: Medicare HMO

## 2016-07-24 ENCOUNTER — Inpatient Hospital Stay (HOSPITAL_COMMUNITY)
Admit: 2016-07-24 | Discharge: 2016-07-24 | Disposition: A | Discharge status: Home / Self Care | Payer: Medicare HMO | Attending: Neurology | Admitting: Neurology

## 2016-07-24 DIAGNOSIS — G9340 Encephalopathy, unspecified: Secondary | ICD-10-CM | POA: Diagnosis not present

## 2016-07-24 DIAGNOSIS — G40411 Other generalized epilepsy and epileptic syndromes, intractable, with status epilepticus: Secondary | ICD-10-CM | POA: Diagnosis not present

## 2016-07-24 LAB — RENAL FUNCTION PANEL
ALBUMIN: 2.8 g/dL — AB (ref 3.5–5.0)
ANION GAP: 9 (ref 5–15)
BUN: 5 mg/dL — ABNORMAL LOW (ref 6–20)
CALCIUM: 7.6 mg/dL — AB (ref 8.9–10.3)
CO2: 24 mmol/L (ref 22–32)
CREATININE: 0.72 mg/dL (ref 0.44–1.00)
Chloride: 92 mmol/L — ABNORMAL LOW (ref 101–111)
GFR calc non Af Amer: >60 mL/min (ref >60)
Glucose, Bld: 130 mg/dL — ABNORMAL HIGH (ref 65–99)
Phosphorus: 2.4 mg/dL — ABNORMAL LOW (ref 2.5–4.6)
Potassium: 3.9 mmol/L (ref 3.5–5.1)
SODIUM: 125 mmol/L — AB (ref 135–145)

## 2016-07-24 LAB — CBC
HCT: 35.5 % — ABNORMAL LOW (ref 36.0–46.0)
HEMOGLOBIN: 12.5 g/dL (ref 12.0–15.0)
MCH: 30.5 pg (ref 26.0–34.0)
MCHC: 35.2 g/dL (ref 30.0–36.0)
MCV: 86.6 fL (ref 78.0–100.0)
PLATELETS: 349 10*3/uL (ref 150–400)
RBC: 4.1 MIL/uL (ref 3.87–5.11)
RDW: 13.2 % (ref 11.5–15.5)
WBC: 12.4 10*3/uL — ABNORMAL HIGH (ref 4.0–10.5)

## 2016-07-24 LAB — VITAMIN B12: Vitamin B-12: 2310 pg/mL — ABNORMAL HIGH (ref 180–914)

## 2016-07-24 LAB — PHENYTOIN LEVEL, TOTAL: PHENYTOIN LVL: 7.2 ug/mL — AB (ref 10.0–20.0)

## 2016-07-24 LAB — MAGNESIUM: MAGNESIUM: 1.5 mg/dL — AB (ref 1.7–2.4)

## 2016-07-24 MED ORDER — NOREPINEPHRINE 4 MG/250ML-% IV SOLN
INTRAVENOUS | Status: AC
  Filled 2016-07-24: qty 250

## 2016-07-24 MED ORDER — PHENYTOIN SODIUM 50 MG/ML IJ SOLN
INTRAMUSCULAR | Status: AC
  Filled 2016-07-24: qty 5

## 2016-07-24 MED ORDER — VALPROATE SODIUM 500 MG/5ML IV SOLN
1.0000 g | Freq: Three times a day (TID) | INTRAVENOUS | Status: DC
  Administered 2016-07-24 – 2016-07-25 (×4): 1000 mg via INTRAVENOUS
  Filled 2016-07-24 (×12): qty 10

## 2016-07-24 MED ORDER — SCOPOLAMINE 1 MG/3DAYS TD PT72
MEDICATED_PATCH | TRANSDERMAL | Status: AC
  Filled 2016-07-24: qty 1

## 2016-07-24 MED ORDER — SODIUM CHLORIDE 0.9 % IV SOLN
1500.0000 mg | Freq: Two times a day (BID) | INTRAVENOUS | Status: DC
  Filled 2016-07-24 (×2): qty 15

## 2016-07-24 MED ORDER — DEXTROSE 5 % IV SOLN
1.0000 g | INTRAVENOUS | Status: DC
  Administered 2016-07-24: 1000 mg via INTRAVENOUS
  Filled 2016-07-24 (×2): qty 10

## 2016-07-24 MED ORDER — MUPIROCIN 2 % EX OINT
1.0000 "application " | TOPICAL_OINTMENT | Freq: Two times a day (BID) | CUTANEOUS | Status: DC
  Administered 2016-07-24 – 2016-07-25 (×4): 1 via NASAL
  Filled 2016-07-24: qty 22

## 2016-07-24 MED ORDER — ORAL CARE MOUTH RINSE
15.0000 mL | Freq: Two times a day (BID) | OROMUCOSAL | Status: DC
  Administered 2016-07-25 (×2): 15 mL via OROMUCOSAL

## 2016-07-24 MED ORDER — METOPROLOL TARTRATE 5 MG/5ML IV SOLN
INTRAVENOUS | Status: AC
  Administered 2016-07-24: 5 mg
  Filled 2016-07-24: qty 5

## 2016-07-24 MED ORDER — LORAZEPAM 2 MG/ML IJ SOLN
1.0000 mg | Freq: Three times a day (TID) | INTRAMUSCULAR | Status: DC
  Administered 2016-07-24 – 2016-07-25 (×5): 1 mg via INTRAVENOUS
  Filled 2016-07-24 (×5): qty 1

## 2016-07-24 MED ORDER — SCOPOLAMINE 1 MG/3DAYS TD PT72
1.0000 | MEDICATED_PATCH | TRANSDERMAL | Status: DC
  Administered 2016-07-24: 1.5 mg via TRANSDERMAL
  Filled 2016-07-24 (×2): qty 1

## 2016-07-24 MED ORDER — PHENYTOIN SODIUM 50 MG/ML IJ SOLN
INTRAMUSCULAR | Status: AC
  Filled 2016-07-24: qty 2

## 2016-07-24 MED ORDER — CHLORHEXIDINE GLUCONATE CLOTH 2 % EX PADS
6.0000 | MEDICATED_PAD | Freq: Every day | CUTANEOUS | Status: DC
  Administered 2016-07-24 – 2016-07-25 (×2): 6 via TOPICAL

## 2016-07-24 MED ORDER — LEVETIRACETAM IN NACL 1500 MG/100ML IV SOLN
1500.0000 mg | Freq: Two times a day (BID) | INTRAVENOUS | Status: DC
  Administered 2016-07-24 – 2016-07-25 (×4): 1500 mg via INTRAVENOUS
  Filled 2016-07-24 (×9): qty 100

## 2016-07-24 MED ORDER — SODIUM CHLORIDE 0.9 % IV SOLN
120.0000 mg | Freq: Three times a day (TID) | INTRAVENOUS | Status: DC
  Administered 2016-07-24 – 2016-07-26 (×4): 120 mg via INTRAVENOUS
  Filled 2016-07-24 (×13): qty 2.4

## 2016-07-24 MED ORDER — METOPROLOL TARTRATE 5 MG/5ML IV SOLN
2.5000 mg | Freq: Three times a day (TID) | INTRAVENOUS | Status: DC | PRN
  Administered 2016-07-24 – 2016-07-25 (×4): 2.5 mg via INTRAVENOUS
  Filled 2016-07-24 (×4): qty 5

## 2016-07-24 MED ORDER — LORAZEPAM BOLUS VIA INFUSION
1.0000 mg | Freq: Three times a day (TID) | INTRAVENOUS | Status: DC
  Filled 2016-07-24: qty 1

## 2016-07-24 MED ORDER — MORPHINE SULFATE (PF) 2 MG/ML IV SOLN
1.0000 mg | INTRAVENOUS | Status: DC | PRN
  Administered 2016-07-24 – 2016-07-25 (×3): 1 mg via INTRAVENOUS
  Filled 2016-07-24 (×3): qty 1

## 2016-07-24 MED ORDER — CHLORHEXIDINE GLUCONATE 0.12 % MT SOLN
15.0000 mL | Freq: Two times a day (BID) | OROMUCOSAL | Status: DC
  Administered 2016-07-25 (×2): 15 mL via OROMUCOSAL
  Filled 2016-07-24: qty 15

## 2016-07-24 NOTE — Progress Notes (Addendum)
Discussed case with Dr. [name]. Based on EEG report patient is in status epilepticus that has not responded to 3 antiepileptic agents. Treatment of choice would be to intubate patient and place in a state of drug induced medical coma with continuous EEG and transfer to [facility] in [place]. She is currently a DO NOT RESUSCITATE and patient's family wants to limit scope of treatment. This evening I was notified as to patient's change in respiratory pattern. I wonder if she may have aspirated. I have discussed in detail with patient's husband and son. They would like her to be treated with comfort and dignity. They would like to initiate IV morphine to treat dyspnea and potential pain. They understand she may likely not survive the night. Will continue to monitor.  Domingo Mend, MD [facility] Pager: [phone]  Time spent: 35 minutes

## 2016-07-24 NOTE — Progress Notes (Signed)
Offsite EEG completed at [facility].  Results pending. 

## 2016-07-24 NOTE — Progress Notes (Signed)
1035 Patient observed having seizure lasting 30 secs in duration. Preictal patient turned head to RIGHT side and had a blank stare. During seizure patient's head noted contracted to the RIGHT side with rapid eye jerking and RIGHT arm extended and jerking. Postictal patient noted lethargic and fell asleep.

## 2016-07-24 NOTE — Progress Notes (Signed)
Patient's seizures have improved per the nursing staff. However, she still has some spells. The Keppra will be increased and valproic acid added.

## 2016-07-24 NOTE — Procedures (Signed)
Lisa Isle A. [name], MD     [email or web address]           HISTORY: The patient 83 year old who has a history of new onset seizures. She presents with multiple seizures and is clinically in complex partial status epilepticus.  MEDICATIONS: Scheduled Meds: . calcitonin (salmon)  1 spray Alternating Nares Daily  . chlorhexidine  15 mL Mouth Rinse BID  . Chlorhexidine Gluconate Cloth  6 each Topical Q0600  . levETIRAcetam  1,500 mg Intravenous Q12H  . mouth rinse  15 mL Mouth Rinse q12n4p  . mupirocin ointment  1 application Nasal BID  . phenytoin (DILANTIN) IV  100 mg Intravenous Q8H  . potassium chloride  40 mEq Oral Once  . valproate sodium  1 g Intravenous Q24H   Continuous Infusions: . 0.9 % NaCl with KCl 40 mEq / L 75 mL/hr (07/24/16 1459)   PRN Meds:.albuterol, metoprolol  Prior to Admission medications   Medication Sig Start Date End Date Taking? Authorizing Provider  ADVAIR DISKUS 250-50 MCG/DOSE AEPB Inhale 1 puff into the lungs 2 (two) times daily.  06/09/11  Yes Historical Provider, MD  albuterol (PROVENTIL HFA;VENTOLIN HFA) 108 (90 BASE) MCG/ACT inhaler Inhale 2 puffs into the lungs 2 (two) times daily as needed for shortness of breath. For shortness of breath   Yes Historical Provider, MD  ALPRAZolam Duanne Moron) 1 MG tablet Take 1 mg by mouth 4 (four) times daily as needed for anxiety. For anxiety 06/25/11  Yes Historical Provider, MD  aspirin 81 MG tablet Take 81 mg by mouth daily.   Yes Historical Provider, MD  budesonide-formoterol (SYMBICORT) 80-4.5 MCG/ACT inhaler Inhale 2 puffs into the lungs 2 (two) times daily.   Yes Historical Provider, MD  calcitonin, salmon, (MIACALCIN/FORTICAL) 200 UNIT/ACT nasal spray Place 1 spray into the nose daily.   Yes Historical Provider, MD  Calcium Carbonate-Vitamin D (CALTRATE 600+D PO) Take 1 tablet by mouth daily.    Yes Historical Provider, MD  cephALEXin (KEFLEX) 500 MG capsule Take 1 capsule (500 mg total) by  mouth 4 (four) times daily. 02/19/16  Yes Francine Graven, DO  cholecalciferol (VITAMIN D) 1000 UNITS tablet Take 1,000 Units by mouth daily.   Yes Historical Provider, MD  Cranberry 360 MG CAPS Take 300 mg by mouth daily.   Yes Historical Provider, MD  Docusate Calcium (STOOL SOFTENER PO) Take 100 mg by mouth 2 (two) times daily. 03/07/14  Yes Historical Provider, MD  ferrous sulfate 325 (65 FE) MG tablet Take 325 mg by mouth 2 (two) times daily after a meal.    Yes Historical Provider, MD  FLUoxetine (PROZAC) 20 MG tablet Take 20 mg by mouth daily.   Yes Historical Provider, MD  furosemide (LASIX) 40 MG tablet Take 40 mg by mouth 2 (two) times daily.  06/23/11  Yes Historical Provider, MD  Garlic 8938 MG CAPS Take 1 capsule by mouth daily.   Yes Historical Provider, MD  Incontinence Supply Disposable (BLADDER CONTROL PADS EX ABSORB) Ashley  03/06/14  Yes Historical Provider, MD  KLOR-CON M20 20 MEQ tablet Take 20 mEq by mouth 2 (two) times daily.  04/29/11  Yes Historical Provider, MD  metolazone (ZAROXOLYN) 2.5 MG tablet Take 1 tablet by mouth daily. 06/23/16  Yes Historical Provider, MD  metoprolol (LOPRESSOR) 50 MG tablet Take 50 mg by mouth 2 (two) times daily.   Yes Historical Provider, MD  oxybutynin (DITROPAN) 5 MG tablet Take 5 mg by mouth 2 (two) times daily. Reported on 06/26/2015  Yes Historical Provider, MD  oxyCODONE (ROXICODONE) 15 MG immediate release tablet Take 1-2 tablets by mouth every 4 (four) hours. 05/22/16  Yes Historical Provider, MD  Oxycodone HCl 10 MG TABS Take 10 mg by mouth every 4 (four) hours as needed for severe pain. Take a max of 2 tablets per day with percocet for severe pain.    Yes Historical Provider, MD  risperiDONE (RISPERDAL) 0.5 MG tablet Take 1 tablet by mouth daily. 07/09/16  Yes Historical Provider, MD  traMADol (ULTRAM) 50 MG tablet Take 50 mg by mouth 4 (four) times daily.   Yes Historical Provider, MD  vitamin B-12 (CYANOCOBALAMIN) 1000 MCG tablet Take 1,000  mcg by mouth daily.   Yes Historical Provider, MD  zolpidem (AMBIEN) 10 MG tablet Take 10 mg by mouth at bedtime as needed for sleep.    Yes Historical Provider, MD  Multiple Vitamins-Minerals (HAIR SKIN AND NAILS FORMULA PO) Take 1 tablet by mouth 2 (two) times daily.     Historical Provider, MD  oxyCODONE-acetaminophen (PERCOCET) 10-325 MG tablet Take 1 tablet by mouth every 4 (four) hours as needed for pain.     Historical Provider, MD  pantoprazole (PROTONIX) 40 MG tablet TAKE ONE TABLET BY MOUTH ONCE DAILY BEFORE SUPPER. Patient not taking: Reported on 08/02/2016 11/08/15   Rogene Houston, MD  psyllium (REGULOID) 0.52 g capsule Take 1.04 g by mouth daily.    Historical Provider, MD      ANALYSIS: A 16 channel recording using standard 10 20 measurements is conducted for 20 minutes.  The background activity gets as high as 5 Hz. There is beta activity observed in the frontal areas a time. Immediately after the recording was started the patient is noted to have rhythmic delta activity mostly involving the left side associated with eye blinking. This changed in frequency and involves to left hemispheric spike slow wave activity and sharp wave activity with maximum at T3. This changes in frequency and associated with her head turning to the right. This is followed by slowing of both the left hemisphere and right hemispheres. She had four these events throughout the recording. Occasional spindles and K complexes are observed in between these events.   IMPRESSION: This recording is abnormal showing evidence of recurrent electrographic seizures emanating from the left hemisphere and associated with global slowing afterwards. It is consistent with status epilepticus.       [name], M.D.  Diplomate, Tax adviser of Psychiatry and Neurology ( Neurology).

## 2016-07-24 NOTE — Care Management Note (Signed)
Case Management Note  Patient Details  Name: Lisa Saunders MRN: [id number] Date of Birth: [birth year]  Subjective/Objective:    Adm with seizures and hyponatremia.               Action/Plan: CM continues to follow for ongoing needs. Palliative consulted and having daily conversations with family.    Expected Discharge Date:       07/27/2016           Expected Discharge Plan:     In-House Referral:     Discharge planning Services  CM Consult  Post Acute Care Choice:    Choice offered to:     DME Arranged:    DME Agency:     HH Arranged:    HH Agency:     Status of Service:  In process, will continue to follow  If discussed at Long Length of Stay Meetings, dates discussed:    Additional Comments:  Lisa Saunders, Lisa Reading, RN 07/24/2016, 3:56 PM

## 2016-07-24 NOTE — Progress Notes (Signed)
1404 patient's heart rate noted to sustain in 140s-150s MD notified. New order given for Lopressor 2.5mg  IV push Q8H PRN for HR > 140.

## 2016-07-24 NOTE — Progress Notes (Signed)
Grant call from [place] with EEG who gave ETA of 12pm to come to [facility] ICU for patient's EEG.

## 2016-07-24 NOTE — Progress Notes (Signed)
PROGRESS NOTE    Lisa Saunders  PRF:[id number] DOB: Sep 06, 1932 DOA: 08/12/2016 PCP: Glo Herring, MD     Brief Narrative:  83 year old woman admitted to the hospital on 4/3 due to blood in stool. She subsequently had a seizure in the emergency department and was found to have a sodium of 114. She was placed on normal saline and admitted to the hospital, unfortunately she had greater than 8 seizure events overnight and decision was made to place her on hypertonic saline which was discontinued on 4/4 after her most recent sodium level was found to be 120. Palliative care discussions are ongoing.   Assessment & Plan:   Principal Problem:   Hyponatremia Active Problems:   Hypertension   Back pain   PAF (paroxysmal atrial fibrillation) (HCC)   Lactic acidosis   Hypokalemia   Palliative care encounter   Goals of care, counseling/discussion   Hyponatremia -Sodium level currently 125, continue fluids as directed by nephrology. Did receive hypertonic saline on 4/3 given recalcitrant seizures.  Seizures -Question if related to hyponatremia. -Has continued to have some partial seizures today altho improved from prior. -EEG with concerns for status epilepticus. -on Keppra and phenytoin. -Neurology is on board.  History of paroxysmal atrial fibrillation -Not currently on anticoagulation given initial presentation of GI bleed. -Has had episodes of RVR especially when actively seizing. -PRN metoprolol ordered.  Acute metabolic encephalopathy -Believed secondary to hyponatremia and multiple seizures overnight. -Is improved today. -It is quite possible that she might have some permanent brain damage from recurrent seizures and hyponatremia, only time will tell. -Neurology consultation has been requested. -Palliative care discussion are ongoing.  Unclear of need for abx or indication. Will Dc at this time.  DVT prophylaxis: SCDs  Code Status: DO NOT RESUSCITATE  Family  Communication: Discussed with husband, son, niece at bedside 4/4 Disposition Plan: Keep in ICU today, final disposition pending palliative care discussions with family and clinical course.  Consultants:  Nephrology  Neurology  Palliative care   Procedures:  None   Antimicrobials:  Anti-infectives    Start     Dose/Rate Route Frequency Ordered Stop   08/18/2016 2100  piperacillin-tazobactam (ZOSYN) IVPB 3.375 g  Status:  Discontinued     3.375 g 12.5 mL/hr over 240 Minutes Intravenous Every 8 hours 08/11/2016 1904 07/24/16 1321   08/17/2016 2000  vancomycin (VANCOCIN) IVPB 1000 mg/200 mL premix  Status:  Discontinued     1,000 mg 200 mL/hr over 60 Minutes Intravenous Every 12 hours 08/15/2016 1903 07/24/16 1321   08/08/2016 1045  vancomycin (VANCOCIN) IVPB 1000 mg/200 mL premix     1,000 mg 200 mL/hr over 60 Minutes Intravenous  Once 08/08/2016 1039 08/17/2016 1233   08/17/2016 1030  cefTRIAXone (ROCEPHIN) 1 g in dextrose 5 % 50 mL IVPB     1 g Intravenous  Once 08/16/2016 1025 08/09/2016 1135       Subjective: Sleepy, can open eyes and move head towards voices when being spoken to, however is unable to verbalize.   Objective: Vitals:   07/24/16 1436 07/24/16 1500 07/24/16 1600 07/24/16 1624  BP:  118/87 136/84   Pulse:  (!) 109 (!) 132 (!) 142  Resp:      Temp:    100 F (37.8 C)  TempSrc:    Axillary  SpO2: 97% 97% 96% 96%  Weight:      Height:        Intake/Output Summary (Last 24 hours) at 07/24/16 1649 Last data  filed at 07/24/16 1500  Gross per 24 hour  Intake          5843.25 ml  Output             1550 ml  Net          4293.25 ml   Filed Weights   07/31/2016 1320 08/13/2016 1700 07/23/16 0500  Weight: 73 kg (161 lb) 75.4 kg (166 lb 3.6 oz) 75.4 kg (166 lb 3.6 oz)    Examination:  General exam:Drowsy  Respiratory system: Clear to auscultation. Respiratory effort normal. Cardiovascular system Tachycardic, irregular. Gastrointestinal system: Abdomen is nondistended, soft  and nontender. No organomegaly or masses felt. Normal bowel sounds heard. Central nervous system: Unable to assess given current mental state  Extremities: No C/C/E, +pedal pulses Skin: No rashes, lesions or ulcers Psychiatry: Unable to assess given current mental state     Data Reviewed: I have personally reviewed following labs and imaging studies  CBC:  Recent Labs Lab 07/31/2016 1024 07/23/16 0413 07/24/16 0449  WBC 16.8* 14.8* 12.4*  NEUTROABS 13.9*  --   --   HGB 14.6 13.9 12.5  HCT 40.3 38.0 35.5*  MCV 84.5 84.1 86.6  PLT 348 327 626   Basic Metabolic Panel:  Recent Labs Lab 07/23/2016 1341  08/17/2016 2000  07/23/16 0413 07/23/16 0636 07/23/16 0807 07/23/16 1014 07/24/16 0449 07/24/16 1135  NA 115*  < >  --   < > 118* 120* 121* 120* 125*  --   K <2.0*  < >  --   < > 2.8* 2.8* 2.7* 3.0* 3.9  --   CL 73*  < >  --   < > 82* 82* 82* 81* 92*  --   CO2 30  < >  --   < > 25 26 28 27 24   --   GLUCOSE 114*  < >  --   < > 117* 125* 137* 149* 130*  --   BUN 12  < >  --   < > 6 6 5* 5* 5*  --   CREATININE 0.68  < >  --   < > 0.58 0.53 0.63 0.63 0.72  --   CALCIUM 7.7*  < >  --   < > 7.7* 7.8* 7.8* 7.8* 7.6*  --   MG 1.2*  --   --   --   --   --   --   --   --  1.5*  PHOS  --   --  2.3*  --   --   --   --   --  2.4*  --   < > = values in this interval not displayed. GFR: Estimated Creatinine Clearance: 54.9 mL/min (by C-G formula based on SCr of 0.72 mg/dL). Liver Function Tests:  Recent Labs Lab 08/06/2016 1024 08/09/2016 1133 07/24/16 0449  AST 47* 34  --   ALT 26 21  --   ALKPHOS 79 62  --   BILITOT 1.4* 0.8  --   PROT 7.3 5.9*  --   ALBUMIN 3.8 3.0* 2.8*   No results for input(s): LIPASE, AMYLASE in the last 168 hours. No results for input(s): AMMONIA in the last 168 hours. Coagulation Profile:  Recent Labs Lab 08/15/2016 1024  INR 1.15   Cardiac Enzymes:  Recent Labs Lab 08/15/2016 1818 07/23/16 0044 07/23/16 0636  TROPONINI <0.03 <0.03 <0.03   BNP  (last 3 results) No results for input(s): PROBNP in the last 8760  hours. HbA1C: No results for input(s): HGBA1C in the last 72 hours. CBG: No results for input(s): GLUCAP in the last 168 hours. Lipid Profile: No results for input(s): CHOL, HDL, LDLCALC, TRIG, CHOLHDL, LDLDIRECT in the last 72 hours. Thyroid Function Tests:  Recent Labs  08/09/2016 1024  TSH 1.599   Anemia Panel:  Recent Labs  07/24/16 0449  VITAMINB12 2,310*   Urine analysis:    Component Value Date/Time   COLORURINE YELLOW 08/13/2016 [place] 08/02/2016 1231   LABSPEC 1.008 07/21/2016 1231   PHURINE 7.0 08/18/2016 1231   GLUCOSEU NEGATIVE 07/31/2016 1231   HGBUR NEGATIVE 08/18/2016 1231   BILIRUBINUR NEGATIVE 08/18/2016 1231   KETONESUR NEGATIVE 08/08/2016 1231   PROTEINUR NEGATIVE 08/01/2016 1231   UROBILINOGEN 0.2 07/19/2011 0859   NITRITE NEGATIVE 07/21/2016 1231   LEUKOCYTESUR NEGATIVE 08/01/2016 1231   Sepsis Labs: @LABRCNTIP (procalcitonin:4,lacticidven:4)  ) Recent Results (from the past 240 hour(s))  Blood Culture (routine x 2)     Status: None (Preliminary result)   Collection Time: 08/15/2016 10:24 AM  Result Value Ref Range Status   Specimen Description BLOOD RIGHT FOREARM  Final   Special Requests   Final    BOTTLES DRAWN AEROBIC AND ANAEROBIC Blood Culture adequate volume   Culture NO GROWTH 2 DAYS  Final   Report Status PENDING  Incomplete  Blood Culture (routine x 2)     Status: None (Preliminary result)   Collection Time: 07/25/2016 10:36 AM  Result Value Ref Range Status   Specimen Description BLOOD RIGHT HAND  Final   Special Requests   Final    BOTTLES DRAWN AEROBIC ONLY Blood Culture results may not be optimal due to an inadequate volume of blood received in culture bottles   Culture NO GROWTH 2 DAYS  Final   Report Status PENDING  Incomplete  MRSA PCR Screening     Status: Abnormal   Collection Time: 07/30/2016  5:36 PM  Result Value Ref Range Status   MRSA  by PCR POSITIVE (A) NEGATIVE Final    Comment:        The GeneXpert MRSA Assay (FDA approved for NASAL specimens only), is one component of a comprehensive MRSA colonization surveillance program. It is not intended to diagnose MRSA infection nor to guide or monitor treatment for MRSA infections. RESULT CALLED TO, READ BACK BY AND VERIFIED WITH:  DANIELS,J @ 0017 ON 07/23/16 BY JUW          Radiology Studies: No results found.      Scheduled Meds: . calcitonin (salmon)  1 spray Alternating Nares Daily  . chlorhexidine  15 mL Mouth Rinse BID  . Chlorhexidine Gluconate Cloth  6 each Topical Q0600  . levETIRAcetam  1,500 mg Intravenous Q12H  . mouth rinse  15 mL Mouth Rinse q12n4p  . mupirocin ointment  1 application Nasal BID  . phenytoin (DILANTIN) IV  100 mg Intravenous Q8H  . potassium chloride  40 mEq Oral Once  . valproate sodium  1 g Intravenous Q24H   Continuous Infusions: . 0.9 % NaCl with KCl 40 mEq / L 75 mL/hr (07/24/16 1459)     LOS: 2 days    Time spent: 35 minutes. Greater than 50% of this time was spent in direct contact with the patient coordinating care.     Lelon Frohlich, MD [facility] Pager [phone]  If 7PM-7AM, please contact night-coverage [email or web address] Password [facility] 07/24/2016, 4:49 PM

## 2016-07-24 NOTE — Progress Notes (Signed)
DR HERNANDEZ CALLED TO VERIFY THAT SHE DOES WANT PT TO RECEIVE IV MORPHINE AS ORDERED DESPITE LISTED ALLERGY.

## 2016-07-24 NOTE — Progress Notes (Signed)
Daily Progress Note   Patient Name: Lisa Saunders       Date: 07/24/2016 DOB: 19-Aug-1932  Age: 83 y.o. MRN#: [id number] Attending Physician: Koleen Nimrod Acost* Primary Care Physician: Glo Herring, MD Admit Date: 08/04/2016  Reason for Consultation/Follow-up: Establishing goals of care and Psychosocial/spiritual support  Subjective: Mrs. Rutt is being bathed by staff. She briefly makes eye contact with me, but does not try to communicate at this time. Immediate and extended family are in the waiting room outside of the ICU. Mr. Oldenburg and sons, Ronalee Belts and Lanny Hurst, come to my office for a family meeting. We talk about [name]'s functional status over the last year. Family states that there have been some declines. Mrs. Fikes had been able to get her own bathing and dressing, but did not do housework. This started after her 3rd back surgery and TENs placement in 2012. Family states there's been a sharp decline over the last 10 days.  We talk about Mrs. Rhome's chronic health history including multiple back surgeries, anxiety and depression, COPD (she smoked unfiltered cigarettes 1.5 PPD for approximately 12 years, stopping in 1966), fibromyalgia, urinary incontinence with chronic cystitis, abdominal hysterectomy and appendectomy, cardiac cath in 2004 showed mitral regurgitation, knee surgery, and functional decline over the past few years to the point where she mostly used a rolling walker, mostly chair/bed bound.  I share a diagram of the chronic illness pathway, and we talk about normal and expected changes and declines.  We review Mrs. Patton's labs including sodium, potassium, white blood cells, urinalysis.  we also review chest x-ray and MRI of brain. We discuss  consultation with neurologist and nephrologist. I encouraged [place] family for another 24 to 33 hours for outcomes. I encourage them to think about Mrs. Lazar's future, I share my concern over Mr. Huffaker ability to care for her at home if she were to be able to recover. Mr. Aveni and his sons agree that he likely will not be able to continue to care for her at home. We talk about SNF for rehab/residential only briefly. I share that improvements may be very slow, but Mrs. Milroy may also have sudden sharp declines.   I encourage them, when making choices for Mrs. Mahr, to consider 1) "will it change what is happening?", Such as her  chronic pain, decreased functional status, urinary incontinence; 2) "what would Mrs. Forner of 5 to 10 years ago say about her quality of life, what is important to her?".  Son Lanny Hurst states that his mother had talked about death and dying about one month ago. He states she asked him if he were afraid to die. Family also states that Mrs. Wixon has stated many times that she was in so much pain she "wished she could die".   I encourage Mr. Kilcrease to take care of himself today, his wife seems to be stabilized/mild improvement. We plan to meet 4/6 at 10 AM.  Length of Stay: 2  Current Medications: Scheduled Meds:  . calcitonin (salmon)  1 spray Alternating Nares Daily  . chlorhexidine  15 mL Mouth Rinse BID  . Chlorhexidine Gluconate Cloth  6 each Topical Q0600  . levETIRAcetam  1,500 mg Intravenous Q12H  . mouth rinse  15 mL Mouth Rinse q12n4p  . mupirocin ointment  1 application Nasal BID  . phenytoin (DILANTIN) IV  100 mg Intravenous Q8H  . piperacillin-tazobactam (ZOSYN)  IV  3.375 g Intravenous Q8H  . potassium chloride  40 mEq Oral Once  . valproate sodium  1 g Intravenous Q24H  . vancomycin  1,000 mg Intravenous Q12H    Continuous Infusions: . 0.9 % NaCl with KCl 40 mEq / L 75 mL/hr (07/24/16 0826)    PRN Meds: albuterol  Physical  Exam  Constitutional: No distress.  Chronically ill appearing, frail  HENT:  Head: Normocephalic and atraumatic.  Cardiovascular:  Rate 140's at times  Pulmonary/Chest: Effort normal. No respiratory distress.  Abdominal: Soft. She exhibits no distension.  Musculoskeletal: She exhibits no edema.  Muscle wasting in legs  Neurological:  More alert today, single words  Skin: Skin is warm and dry.  Nursing note and vitals reviewed.           Vital Signs: BP 131/68   Pulse (!) 142   Temp 99.5 F (37.5 C) (Axillary)   Resp (!) 32   Ht 5\' 4"  (1.626 m)   Wt 75.4 kg (166 lb 3.6 oz)   SpO2 98%   BMI 28.53 kg/m  SpO2: SpO2: 98 % O2 Device: O2 Device: High Flow Nasal Cannula O2 Flow Rate: O2 Flow Rate (L/min): 10 L/min  Intake/output summary:  Intake/Output Summary (Last 24 hours) at 07/24/16 1254 Last data filed at 07/24/16 6578  Gross per 24 hour  Intake          3107.25 ml  Output             1550 ml  Net          1557.25 ml   LBM: Last BM Date: 07/23/16 Baseline Weight: Weight: 73 kg (161 lb) Most recent weight: Weight: 75.4 kg (166 lb 3.6 oz)       Palliative Assessment/Data:    Flowsheet Rows     Most Recent Value  Intake Tab  Referral Department  Hospitalist  Unit at Time of Referral  ICU  Palliative Care Primary Diagnosis  Neurology  Date Notified  07/23/16  Palliative Care Type  New Palliative care  Reason for referral  Clarify Goals of Care  Date of Admission  08/05/2016  Date first seen by Palliative Care  07/23/16  # of days Palliative referral response time  0 Day(s)  # of days IP prior to Palliative referral  1  Clinical Assessment  Palliative Performance Scale Score  10%  Pain Max  last 24 hours  Not able to report  Pain Min Last 24 hours  Not able to report  Dyspnea Max Last 24 Hours  Not able to report  Dyspnea Min Last 24 hours  Not able to report  Psychosocial & Spiritual Assessment  Palliative Care Outcomes  Patient/Family meeting held?  Yes    Who was at the meeting?  Son Ronalee Belts and his husband Ulice Dash, Niece Margaretha Sheffield  Palliative Care Outcomes  Provided psychosocial or spiritual support, Provided end of life care assistance, Clarified goals of care  Patient/Family wishes: Interventions discontinued/not started   Mechanical Ventilation, Tube feedings/TPN      Patient Active Problem List   Diagnosis Date Noted  . Palliative care encounter   . Goals of care, counseling/discussion   . Hyponatremia 07/24/2016  . Lactic acidosis 08/05/2016  . Hypokalemia 07/24/2016  . GERD (gastroesophageal reflux disease) 12/18/2014  . Weight loss 12/18/2014  . PAF (paroxysmal atrial fibrillation) ([place]) 03/09/2013  . Heme positive stool 03/08/2013  . Leg edema 10/26/2012  . Constipation 03/24/2012  . Asthma 10/08/2011  . Hypertension 07/08/2011  . Back pain 07/08/2011  . Muscle weakness (generalized) 01/13/2011  . Abnormality of gait 01/13/2011  . Difficulty in walking(719.7) 01/13/2011    Palliative Care Assessment & Plan   Patient Profile: 83 y.o. female  with past medical history of Anxiety and depression, chronic cystitis, COPD, fibromyalgia, hypertension, mixed incontinence, functional decline over the last year, with particular declines over the last 2 months being basically bedbound admitted on 08/13/2016 with hyponatremia (115).   Assessment: Hyponatremia: came in at 112-115, increased today to 125. Mg replaced and K+ replaced.  Continue to monitor.  Seizure disorder: per neurology, added dilantin, increased keppra. 1-2? Seizures overnight.   Recommendations/Plan:  Continue to treat the treatable, 24-48 hours for stabilizing  Goals of Care and Additional Recommendations:  Limitations on Scope of Treatment: treat the treatable, no CPR or Intubation.   Code Status:    Code Status Orders        Start     Ordered   07/23/16 1153  Do not attempt resuscitation (DNR)  Continuous    Question Answer Comment  In the event of cardiac  or respiratory ARREST Do not call a "code blue"   In the event of cardiac or respiratory ARREST Do not perform Intubation, CPR, defibrillation or ACLS   In the event of cardiac or respiratory ARREST Use medication by any route, position, wound care, and other measures to relive pain and suffering. May use oxygen, suction and manual treatment of airway obstruction as needed for comfort.      07/23/16 1153    Code Status History    Date Active Date Inactive Code Status Order ID Comments User Context   08/07/2016  4:58 PM 07/23/2016 11:53 AM Full Code [id number]  Bethena Roys, MD Inpatient       Prognosis:   Unable to determine, based on outcomes, 6 months or less would not be surprising based on functional decline over the last few months.   Discharge Planning:  To Be Determined, based on outcomes.   Care plan was discussed with nursing staff, CM, SW, and Dr. Jerilee Hoh.   Thank you for allowing the Palliative Medicine Team to assist in the care of this patient.   Time In: 1000 Time Out: 1115 Total Time 75 minutes Prolonged Time Billed  yes       Greater than 50%  of this time was spent counseling and  coordinating care related to the above assessment and plan.  Drue Novel, NP  Please contact Palliative Medicine Team phone at [phone] for questions and concerns.

## 2016-07-24 NOTE — Progress Notes (Signed)
Subjective: Interval History: Patient tries to communicate but her voice is an audible. At this moment is still very difficult to get any information from patient. According to her husband patient slept very well last night. However she had a small episode of seizure activity.  Objective: Vital signs in last 24 hours: Temp:  [97.7 F (36.5 C)-100.2 F (37.9 C)] 100.1 F (37.8 C) (04/05 0739) Pulse Rate:  [107-155] 155 (04/05 0739) Resp:  [18-41] 32 (04/05 0739) BP: (108-131)/(51-76) 131/68 (04/04 1700) SpO2:  [88 %-99 %] 96 % (04/05 0739) Weight change:   Intake/Output from previous day: 04/04 0701 - 04/05 0700 In: 4107.3 [I.V.:3007.3; IV Piggyback:1100] Out: 8115 [Urine:4150] Intake/Output this shift: No intake/output data recorded.  Generally patient is more alert today. Patient shows some attempt to answer questions however her voice is inaudible. Patient is also trying to comunicate with her husband. She is somewhat restless. Chest she has bilateral coarse expiratory rhonchi and wheezing Heart exam revealed regular rate and rhythm Extremities no edema  Lab Results:  Recent Labs  07/23/16 0413 07/24/16 0449  WBC 14.8* 12.4*  HGB 13.9 12.5  HCT 38.0 35.5*  PLT 327 349   BMET:  Recent Labs  07/23/16 1014 07/24/16 0449  NA 120* 125*  K 3.0* 3.9  CL 81* 92*  CO2 27 24  GLUCOSE 149* 130*  BUN 5* 5*  CREATININE 0.63 0.72  CALCIUM 7.8* 7.6*   No results for input(s): PTH in the last 72 hours. Iron Studies: No results for input(s): IRON, TIBC, TRANSFERRIN, FERRITIN in the last 72 hours.  Studies/Results: Ct Head Wo Contrast  Result Date: 08/15/2016 CLINICAL DATA:  83 year old hypertensive female less active for the past week. Possible seizure. Initial encounter. EXAM: CT HEAD WITHOUT CONTRAST TECHNIQUE: Contiguous axial images were obtained from the base of the skull through the vertex without intravenous contrast. COMPARISON:  09/25/2010. FINDINGS: Brain: No  intracranial hemorrhage or CT evidence of large acute infarct. Moderate chronic microvascular changes. Global atrophy without hydrocephalus. No intracranial mass lesion noted on this unenhanced exam. Vascular: Vascular calcifications. Skull: No acute abnormality. Sinuses/Orbits: Post lens replacement without acute orbital abnormality. Complete opacification left maxillary sinus. Other: Negative. IMPRESSION: No intracranial hemorrhage or CT evidence of large acute infarct. Moderate chronic microvascular changes. Global atrophy. Complete opacification left maxillary sinus. Electronically Signed   By: Genia Del M.D.   On: 07/23/2016 12:32   Dg Chest Portable 1 View  Result Date: 08/09/2016 CLINICAL DATA:  Sepsis.  Altered mental status.  COPD. EXAM: PORTABLE CHEST 1 VIEW COMPARISON:  Chest x-ray dated 07/10/2010 FINDINGS: Heart size is within normal limits. Prominent left pericardial fat pad. Pulmonary vascularity is normal. Interstitial markings are accentuated due to a shallow inspiration. No acute infiltrates or effusions. Spinal cord stimulator in place. No acute bone abnormality. Large hiatal hernia. Calcification in the arch of the aorta. IMPRESSION: No acute abnormalities.  Large hiatal hernia. Aortic atherosclerosis. Electronically Signed   By: Lorriane Shire M.D.   On: 08/07/2016 10:59    I have reviewed the patient's current medications.  Assessment/Plan: Problem #1 hyponatremia: Hypovolemic hyponatremia. Her sodium is progressively improving today is 125. Problem #2 hypokalemia her potassium is normal today.  Problem #3 hypertension: Her blood pressure is reasonably controlled Problem #4 history of COPD Problem #5 seizure disorder: Presently being followed by neurology. According to her husband she had some activity but is not as bad as the day before yesterday. Problem #6 history of anemia Problem #7 history of  seizure disorder Problem #8 history of diastolic dysfunction/CHF: Presently  patient is nonoliguric and has 4-100 mL of urine output. She doesn't have any significant sign of fluid overload. Plan: We'll decrease IV fluid to 75 mL per hour We'll check her renal panel in the morning.   LOS: 2 days   Kysean Sweet S 07/24/2016,8:09 AM

## 2016-07-24 NOTE — Consult Note (Addendum)
Dawson A. [name], MD     [email or web address]          Lisa Saunders is an 83 y.o. female.   ASSESSMENT/PLAN: 1. Status epilepticus: The findings are discussed at length with the son and daughter-in-law. The normal course of treatment is to place patient state of drug induced coma with continuous EEG seeking for burst suppression. Unfortunately, the patient had to be transferred to [facility] for this to be done. She currently is a DO NOT RESUSCITATE. They're going to have additional discussion with the rest of the family and make further decisions. We will make additional adjustments to her medications. The Depakote will be increased. The Dilantin will also be increased. We'll go ahead and give the patient Ativan on a scheduled basis 1 mg 3 times a day.  Repeat imaging of the brain with MRI with and the without contrast will be done. We will also do a spelled tap. This is discussed with the patient did agree to proceed.   Hyponatremia    She continues to have frequent clinical seizures although they seem to be less compared to yesterday. The nursing staff reports that she seems to be having them less frequent. The family was at the bedside states however that she is having spells of turning her head to the right about every 4 minutes which is commensurate with EEG findings below. He seemed to be associated with less clonic activity involving the head and neck and the right upper extremity.     GENERAL: Mostly unresponsive: She about 2 events noted throughout the evaluation. She turns her head towards the right with tonic movements of the head and neck and eyes deviated to the right with rhythmic nystagmoid movements of the eyes.  HEENT: Supple. Atraumatic normocephalic.   ABDOMEN: soft  EXTREMITIES: No edema   BACK: Normal.  SKIN: Normal by inspection.    MENTAL STATUS: She is unresponsive but after an event she warms eyes to painful stimuli. She does  focuses and tracks but does not follow commands. There is a little bit of groaning but no other verbal output.  CRANIAL NERVES: Pupils are equal, round and reactive to light; extra ocular movements are full, there is no significant nystagmus; visual fields are  - unreliable; upper and lower facial muscles are normal in strength and symmetric, there is no flattening of the nasolabial folds.  MOTOR: She has movements to painful stimuli but slightly above gravity in the upper extremities and 2/5 in the legs.  COORDINATION: Left finger to nose is normal, right finger to nose is normal, No rest tremor; no intention tremor; no postural tremor; no bradykinesia.  REFLEXES: Deep tendon reflexes are symmetrical and normal.   SENSATION: She responds to deep painful stimuli bilaterally.   EEG This recording is abnormal showing evidence of recurrent electrographic seizures emanating from the left hemisphere and associated with global slowing afterwards. It is consistent with status epilepticus      Blood pressure 136/84, pulse (!) 142, temperature 100 F (37.8 C), temperature source Axillary, resp. rate (!) 22, height '5\' 4"'$  (1.626 m), weight 166 lb 3.6 oz (75.4 kg), SpO2 96 %.  Past Medical History:  Diagnosis Date  . Anemia   . Anxiety   . Back pain   . Chronic cystitis   . Chronic pain   . Complication of anesthesia    pt states"I was in a coma for 5 days after my 3rd back surgery and no one knows why".  husband states she hasnt been right since then,doesnt walk right or anything" Dr Durene Cal was MD  . COPD (chronic obstructive pulmonary disease) ([place])   . Depression   . Fibromyalgia   . Hypertension   . Mixed incontinence     Past Surgical History:  Procedure Laterality Date  . ABDOMINAL HYSTERECTOMY    . APPENDECTOMY    . back surgery x 4    . CARDIAC CATHETERIZATION  11/2002   w/o signficant  (Dr. Gerrie Nordmann)  . CATARACT EXTRACTION Right   . CATARACT EXTRACTION W/PHACO Left  07/19/2012   Procedure: CATARACT EXTRACTION PHACO AND INTRAOCULAR LENS PLACEMENT (IOC);  Surgeon: Williams Che, MD;  Location: [facility];  Service: Ophthalmology;  Laterality: Left;  CDE 12.60  . CHOLECYSTECTOMY    . COLONOSCOPY WITH PROPOFOL N/A 03/21/2013   Procedure: COLONOSCOPY WITH PROPOFOL;  Surgeon: Rogene Houston, MD;  Location: [facility];  Service: Endoscopy;  Laterality: N/A;  In cecum at 751, out at 0802 = 11 minutes total time  . KNEE SURGERY    . POLYPECTOMY N/A 03/21/2013   Procedure: POLYPECTOMY;  Surgeon: Rogene Houston, MD;  Location: [facility];  Service: Endoscopy;  Laterality: N/A;  . SPINAL CORD STIMULATOR IMPLANT    . TONSILLECTOMY    . TRANSTHORACIC ECHOCARDIOGRAM  11/16/2012   EF 71-06%, grade 2 diastolic dysfunction; LA severely dilated; mod TR;     Family History  Problem Relation Age of Onset  . Heart failure Mother   . Heart attack Father     Social History:  reports that she quit smoking about 52 years ago. She has never used smokeless tobacco. She reports that she does not drink alcohol or use drugs.  Allergies:  Allergies  Allergen Reactions  . Morphine     COMA  . Prednisone     Makes me crazy and mean  . Procardia [Nifedipine] Other (See Comments)    unknown  . Latex Rash    Medications: Prior to Admission medications   Medication Sig Start Date End Date Taking? Authorizing Provider  ADVAIR DISKUS 250-50 MCG/DOSE AEPB Inhale 1 puff into the lungs 2 (two) times daily.  06/09/11  Yes Historical Provider, MD  albuterol (PROVENTIL HFA;VENTOLIN HFA) 108 (90 BASE) MCG/ACT inhaler Inhale 2 puffs into the lungs 2 (two) times daily as needed for shortness of breath. For shortness of breath   Yes Historical Provider, MD  ALPRAZolam Duanne Moron) 1 MG tablet Take 1 mg by mouth 4 (four) times daily as needed for anxiety. For anxiety 06/25/11  Yes Historical Provider, MD  aspirin 81 MG tablet Take 81 mg by mouth daily.   Yes Historical Provider, MD  budesonide-formoterol  (SYMBICORT) 80-4.5 MCG/ACT inhaler Inhale 2 puffs into the lungs 2 (two) times daily.   Yes Historical Provider, MD  calcitonin, salmon, (MIACALCIN/FORTICAL) 200 UNIT/ACT nasal spray Place 1 spray into the nose daily.   Yes Historical Provider, MD  Calcium Carbonate-Vitamin D (CALTRATE 600+D PO) Take 1 tablet by mouth daily.    Yes Historical Provider, MD  cephALEXin (KEFLEX) 500 MG capsule Take 1 capsule (500 mg total) by mouth 4 (four) times daily. 02/19/16  Yes Francine Graven, DO  cholecalciferol (VITAMIN D) 1000 UNITS tablet Take 1,000 Units by mouth daily.   Yes Historical Provider, MD  Cranberry 360 MG CAPS Take 300 mg by mouth daily.   Yes Historical Provider, MD  Docusate Calcium (STOOL SOFTENER PO) Take 100 mg by mouth 2 (two) times daily. 03/07/14  Yes Historical Provider, MD  ferrous sulfate 325 (65 FE) MG tablet Take 325 mg by mouth 2 (two) times daily after a meal.    Yes Historical Provider, MD  FLUoxetine (PROZAC) 20 MG tablet Take 20 mg by mouth daily.   Yes Historical Provider, MD  furosemide (LASIX) 40 MG tablet Take 40 mg by mouth 2 (two) times daily.  06/23/11  Yes Historical Provider, MD  Garlic 4098 MG CAPS Take 1 capsule by mouth daily.   Yes Historical Provider, MD  Incontinence Supply Disposable (BLADDER CONTROL PADS EX ABSORB) [place]  03/06/14  Yes Historical Provider, MD  KLOR-CON M20 20 MEQ tablet Take 20 mEq by mouth 2 (two) times daily.  04/29/11  Yes Historical Provider, MD  metolazone (ZAROXOLYN) 2.5 MG tablet Take 1 tablet by mouth daily. 06/23/16  Yes Historical Provider, MD  metoprolol (LOPRESSOR) 50 MG tablet Take 50 mg by mouth 2 (two) times daily.   Yes Historical Provider, MD  oxybutynin (DITROPAN) 5 MG tablet Take 5 mg by mouth 2 (two) times daily. Reported on 06/26/2015   Yes Historical Provider, MD  oxyCODONE (ROXICODONE) 15 MG immediate release tablet Take 1-2 tablets by mouth every 4 (four) hours. 05/22/16  Yes Historical Provider, MD  Oxycodone HCl 10 MG TABS Take  10 mg by mouth every 4 (four) hours as needed for severe pain. Take a max of 2 tablets per day with percocet for severe pain.    Yes Historical Provider, MD  risperiDONE (RISPERDAL) 0.5 MG tablet Take 1 tablet by mouth daily. 07/09/16  Yes Historical Provider, MD  traMADol (ULTRAM) 50 MG tablet Take 50 mg by mouth 4 (four) times daily.   Yes Historical Provider, MD  vitamin B-12 (CYANOCOBALAMIN) 1000 MCG tablet Take 1,000 mcg by mouth daily.   Yes Historical Provider, MD  zolpidem (AMBIEN) 10 MG tablet Take 10 mg by mouth at bedtime as needed for sleep.    Yes Historical Provider, MD  Multiple Vitamins-Minerals (HAIR SKIN AND NAILS FORMULA PO) Take 1 tablet by mouth 2 (two) times daily.     Historical Provider, MD  oxyCODONE-acetaminophen (PERCOCET) 10-325 MG tablet Take 1 tablet by mouth every 4 (four) hours as needed for pain.     Historical Provider, MD  pantoprazole (PROTONIX) 40 MG tablet TAKE ONE TABLET BY MOUTH ONCE DAILY BEFORE SUPPER. Patient not taking: Reported on 07/31/2016 11/08/15   Rogene Houston, MD  psyllium (REGULOID) 0.52 g capsule Take 1.04 g by mouth daily.    Historical Provider, MD    Scheduled Meds: . calcitonin (salmon)  1 spray Alternating Nares Daily  . chlorhexidine  15 mL Mouth Rinse BID  . Chlorhexidine Gluconate Cloth  6 each Topical Q0600  . levETIRAcetam  1,500 mg Intravenous Q12H  . mouth rinse  15 mL Mouth Rinse q12n4p  . mupirocin ointment  1 application Nasal BID  . phenytoin (DILANTIN) IV  100 mg Intravenous Q8H  . potassium chloride  40 mEq Oral Once  . valproate sodium  1 g Intravenous Q24H   Continuous Infusions: . 0.9 % NaCl with KCl 40 mEq / L 75 mL/hr (07/24/16 1459)   PRN Meds:.albuterol, metoprolol     Results for orders placed or performed during the hospital encounter of 07/28/2016 (from the past 48 hour(s))  Basic metabolic panel     Status: Abnormal   Collection Time: 07/21/2016  5:32 PM  Result Value Ref Range   Sodium 117 (LL) 135 - 145  mmol/L  Comment: CRITICAL RESULT CALLED TO, READ BACK BY AND VERIFIED WITH: MCGIBBONY,C ON 08/12/2016 AT 1900 BY LOY,C    Potassium <2.0 (LL) 3.5 - 5.1 mmol/L    Comment: CRITICAL RESULT CALLED TO, READ BACK BY AND VERIFIED WITH: MCGIBBONY,C ON 08/02/2016 AT 1900 BY LOY,C    Chloride 76 (L) 101 - 111 mmol/L   CO2 29 22 - 32 mmol/L   Glucose, Bld 122 (H) 65 - 99 mg/dL   BUN 10 6 - 20 mg/dL   Creatinine, Ser 0.62 0.44 - 1.00 mg/dL   Calcium 7.4 (L) 8.9 - 10.3 mg/dL   GFR calc non Af Amer >60 >60 mL/min   GFR calc Af Amer >60 >60 mL/min    Comment: (NOTE) The eGFR has been calculated using the CKD EPI equation. This calculation has not been validated in all clinical situations. eGFR's persistently <60 mL/min signify possible Chronic Kidney Disease.    Anion gap 12 5 - 15  MRSA PCR Screening     Status: Abnormal   Collection Time: 08/15/2016  5:36 PM  Result Value Ref Range   MRSA by PCR POSITIVE (A) NEGATIVE    Comment:        The GeneXpert MRSA Assay (FDA approved for NASAL specimens only), is one component of a comprehensive MRSA colonization surveillance program. It is not intended to diagnose MRSA infection nor to guide or monitor treatment for MRSA infections. RESULT CALLED TO, READ BACK BY AND VERIFIED WITH:  DANIELS,J @ 0017 ON 07/23/16 BY JUW   Sodium, urine, random     Status: None   Collection Time: 07/31/2016  6:00 PM  Result Value Ref Range   Sodium, Ur 52 mmol/L  Troponin I (q 6hr x 3)     Status: None   Collection Time: 07/30/2016  6:18 PM  Result Value Ref Range   Troponin I <0.03 <0.03 ng/mL  Basic metabolic panel     Status: Abnormal   Collection Time: 07/31/2016  7:27 PM  Result Value Ref Range   Sodium 118 (LL) 135 - 145 mmol/L    Comment: CRITICAL RESULT CALLED TO, READ BACK BY AND VERIFIED WITH: ROBERTS,T AT 2020 ON 4.3.18 BY ISLEY,B    Potassium 2.0 (LL) 3.5 - 5.1 mmol/L    Comment: CRITICAL RESULT CALLED TO, READ BACK BY AND VERIFIED WITH: ROBERTS,T AT  2020 ON 4.3.18 BY ISLEY,B    Chloride 77 (L) 101 - 111 mmol/L   CO2 31 22 - 32 mmol/L   Glucose, Bld 123 (H) 65 - 99 mg/dL   BUN 9 6 - 20 mg/dL   Creatinine, Ser 0.67 0.44 - 1.00 mg/dL   Calcium 7.4 (L) 8.9 - 10.3 mg/dL   GFR calc non Af Amer >60 >60 mL/min   GFR calc Af Amer >60 >60 mL/min    Comment: (NOTE) The eGFR has been calculated using the CKD EPI equation. This calculation has not been validated in all clinical situations. eGFR's persistently <60 mL/min signify possible Chronic Kidney Disease.    Anion gap 10 5 - 15  Lactic acid, plasma     Status: Abnormal   Collection Time: 08/04/2016  7:27 PM  Result Value Ref Range   Lactic Acid, Venous 2.2 (HH) 0.5 - 1.9 mmol/L    Comment: CRITICAL RESULT CALLED TO, READ BACK BY AND VERIFIED WITH: ROBERTS,T AT 2020 ON 4.3.2018 BY ISLEY.B   Phosphorus     Status: Abnormal   Collection Time: 07/21/2016  8:00 PM  Result Value  Ref Range   Phosphorus 2.3 (L) 2.5 - 4.6 mg/dL  Basic metabolic panel     Status: Abnormal   Collection Time: 07/21/2016 10:13 PM  Result Value Ref Range   Sodium 115 (LL) 135 - 145 mmol/L    Comment: CRITICAL RESULT CALLED TO, READ BACK BY AND VERIFIED WITH: DANIELS,JA T 2300 ON 4.3.18 BY ISLEY,B    Potassium 2.5 (LL) 3.5 - 5.1 mmol/L    Comment: CRITICAL RESULT CALLED TO, READ BACK BY AND VERIFIED WITH: DANIELS,JA T 2300 ON 4.3.18 BY ISLEY,B    Chloride 78 (L) 101 - 111 mmol/L   CO2 26 22 - 32 mmol/L   Glucose, Bld 153 (H) 65 - 99 mg/dL   BUN 8 6 - 20 mg/dL   Creatinine, Ser 0.57 0.44 - 1.00 mg/dL   Calcium 7.5 (L) 8.9 - 10.3 mg/dL   GFR calc non Af Amer >60 >60 mL/min   GFR calc Af Amer >60 >60 mL/min    Comment: (NOTE) The eGFR has been calculated using the CKD EPI equation. This calculation has not been validated in all clinical situations. eGFR's persistently <60 mL/min signify possible Chronic Kidney Disease.    Anion gap 11 5 - 15  Troponin I (q 6hr x 3)     Status: None   Collection Time:  07/23/16 12:44 AM  Result Value Ref Range   Troponin I <0.03 <0.03 ng/mL  Basic metabolic panel     Status: Abnormal   Collection Time: 07/23/16  2:07 AM  Result Value Ref Range   Sodium 117 (LL) 135 - 145 mmol/L    Comment: CRITICAL RESULT CALLED TO, READ BACK BY AND VERIFIED WITH:  ROBERTS,T @ 0236 ON 07/23/16 BY JUW    Potassium 2.5 (LL) 3.5 - 5.1 mmol/L    Comment: CRITICAL RESULT CALLED TO, READ BACK BY AND VERIFIED WITH:  ROBERTS,T @ 0236 ON 07/23/16 BY JUW    Chloride 79 (L) 101 - 111 mmol/L   CO2 24 22 - 32 mmol/L   Glucose, Bld 124 (H) 65 - 99 mg/dL   BUN 6 6 - 20 mg/dL   Creatinine, Ser 0.65 0.44 - 1.00 mg/dL   Calcium 7.9 (L) 8.9 - 10.3 mg/dL   GFR calc non Af Amer >60 >60 mL/min   GFR calc Af Amer >60 >60 mL/min    Comment: (NOTE) The eGFR has been calculated using the CKD EPI equation. This calculation has not been validated in all clinical situations. eGFR's persistently <60 mL/min signify possible Chronic Kidney Disease.    Anion gap 14 5 - 15  CBC     Status: Abnormal   Collection Time: 07/23/16  4:13 AM  Result Value Ref Range   WBC 14.8 (H) 4.0 - 10.5 K/uL   RBC 4.52 3.87 - 5.11 MIL/uL   Hemoglobin 13.9 12.0 - 15.0 g/dL   HCT 38.0 36.0 - 46.0 %   MCV 84.1 78.0 - 100.0 fL   MCH 30.8 26.0 - 34.0 pg   MCHC 36.6 (H) 30.0 - 36.0 g/dL   RDW 12.7 11.5 - 15.5 %   Platelets 327 150 - 400 K/uL  Cortisol-am, blood     Status: None   Collection Time: 07/23/16  4:13 AM  Result Value Ref Range   Cortisol - AM 20.7 6.7 - 22.6 ug/dL    Comment: Performed at [facility], [place] [address] [place], [place] [address]  Basic metabolic panel     Status: Abnormal  Collection Time: 07/23/16  4:13 AM  Result Value Ref Range   Sodium 118 (LL) 135 - 145 mmol/L    Comment: CRITICAL RESULT CALLED TO, READ BACK BY AND VERIFIED WITH: ROBERTS,T AT 5:00AM ON 07/23/16 BY FESTERMAN,C    Potassium 2.8 (L) 3.5 - 5.1 mmol/L   Chloride 82 (L) 101 - 111 mmol/L   CO2 25 22 - 32  mmol/L   Glucose, Bld 117 (H) 65 - 99 mg/dL   BUN 6 6 - 20 mg/dL   Creatinine, Ser 0.58 0.44 - 1.00 mg/dL   Calcium 7.7 (L) 8.9 - 10.3 mg/dL   GFR calc non Af Amer >60 >60 mL/min   GFR calc Af Amer >60 >60 mL/min    Comment: (NOTE) The eGFR has been calculated using the CKD EPI equation. This calculation has not been validated in all clinical situations. eGFR's persistently <60 mL/min signify possible Chronic Kidney Disease.    Anion gap 11 5 - 15  Troponin I (q 6hr x 3)     Status: None   Collection Time: 07/23/16  6:36 AM  Result Value Ref Range   Troponin I <0.03 <0.03 ng/mL  Basic metabolic panel     Status: Abnormal   Collection Time: 07/23/16  6:36 AM  Result Value Ref Range   Sodium 120 (L) 135 - 145 mmol/L   Potassium 2.8 (L) 3.5 - 5.1 mmol/L   Chloride 82 (L) 101 - 111 mmol/L   CO2 26 22 - 32 mmol/L   Glucose, Bld 125 (H) 65 - 99 mg/dL   BUN 6 6 - 20 mg/dL   Creatinine, Ser 0.53 0.44 - 1.00 mg/dL   Calcium 7.8 (L) 8.9 - 10.3 mg/dL   GFR calc non Af Amer >60 >60 mL/min   GFR calc Af Amer >60 >60 mL/min    Comment: (NOTE) The eGFR has been calculated using the CKD EPI equation. This calculation has not been validated in all clinical situations. eGFR's persistently <60 mL/min signify possible Chronic Kidney Disease.    Anion gap 12 5 - 15  Basic metabolic panel     Status: Abnormal   Collection Time: 07/23/16  8:07 AM  Result Value Ref Range   Sodium 121 (L) 135 - 145 mmol/L   Potassium 2.7 (LL) 3.5 - 5.1 mmol/L    Comment: CRITICAL RESULT CALLED TO, READ BACK BY AND VERIFIED WITH: HYLTON,L AT 8:50AM ON 07/23/16 BY FESTERMAN,C    Chloride 82 (L) 101 - 111 mmol/L   CO2 28 22 - 32 mmol/L   Glucose, Bld 137 (H) 65 - 99 mg/dL   BUN 5 (L) 6 - 20 mg/dL   Creatinine, Ser 0.63 0.44 - 1.00 mg/dL   Calcium 7.8 (L) 8.9 - 10.3 mg/dL   GFR calc non Af Amer >60 >60 mL/min   GFR calc Af Amer >60 >60 mL/min    Comment: (NOTE) The eGFR has been calculated using the CKD EPI  equation. This calculation has not been validated in all clinical situations. eGFR's persistently <60 mL/min signify possible Chronic Kidney Disease.    Anion gap 11 5 - 15  Basic metabolic panel     Status: Abnormal   Collection Time: 07/23/16 10:14 AM  Result Value Ref Range   Sodium 120 (L) 135 - 145 mmol/L   Potassium 3.0 (L) 3.5 - 5.1 mmol/L   Chloride 81 (L) 101 - 111 mmol/L   CO2 27 22 - 32 mmol/L   Glucose, Bld 149 (H) 65 -  99 mg/dL   BUN 5 (L) 6 - 20 mg/dL   Creatinine, Ser 0.63 0.44 - 1.00 mg/dL   Calcium 7.8 (L) 8.9 - 10.3 mg/dL   GFR calc non Af Amer >60 >60 mL/min   GFR calc Af Amer >60 >60 mL/min    Comment: (NOTE) The eGFR has been calculated using the CKD EPI equation. This calculation has not been validated in all clinical situations. eGFR's persistently <60 mL/min signify possible Chronic Kidney Disease.    Anion gap 12 5 - 15  CBC     Status: Abnormal   Collection Time: 07/24/16  4:49 AM  Result Value Ref Range   WBC 12.4 (H) 4.0 - 10.5 K/uL   RBC 4.10 3.87 - 5.11 MIL/uL   Hemoglobin 12.5 12.0 - 15.0 g/dL   HCT 35.5 (L) 36.0 - 46.0 %   MCV 86.6 78.0 - 100.0 fL   MCH 30.5 26.0 - 34.0 pg   MCHC 35.2 30.0 - 36.0 g/dL   RDW 13.2 11.5 - 15.5 %   Platelets 349 150 - 400 K/uL  Renal function panel     Status: Abnormal   Collection Time: 07/24/16  4:49 AM  Result Value Ref Range   Sodium 125 (L) 135 - 145 mmol/L   Potassium 3.9 3.5 - 5.1 mmol/L    Comment: DELTA CHECK NOTED   Chloride 92 (L) 101 - 111 mmol/L   CO2 24 22 - 32 mmol/L   Glucose, Bld 130 (H) 65 - 99 mg/dL   BUN 5 (L) 6 - 20 mg/dL   Creatinine, Ser 0.72 0.44 - 1.00 mg/dL   Calcium 7.6 (L) 8.9 - 10.3 mg/dL   Phosphorus 2.4 (L) 2.5 - 4.6 mg/dL   Albumin 2.8 (L) 3.5 - 5.0 g/dL   GFR calc non Af Amer >60 >60 mL/min   GFR calc Af Amer >60 >60 mL/min    Comment: (NOTE) The eGFR has been calculated using the CKD EPI equation. This calculation has not been validated in all clinical  situations. eGFR's persistently <60 mL/min signify possible Chronic Kidney Disease.    Anion gap 9 5 - 15  Vitamin B12     Status: Abnormal   Collection Time: 07/24/16  4:49 AM  Result Value Ref Range   Vitamin B-12 2,310 (H) 180 - 914 pg/mL    Comment: (NOTE) This assay is not validated for testing neonatal or myeloproliferative syndrome specimens for Vitamin B12 levels. Performed at [facility], [place] [address] [place], [place] [address]   Magnesium     Status: Abnormal   Collection Time: 07/24/16 11:35 AM  Result Value Ref Range   Magnesium 1.5 (L) 1.7 - 2.4 mg/dL    Studies/Results:    HEAD CT FINDINGS: Brain: No intracranial hemorrhage or CT evidence of large acute infarct.  Moderate chronic microvascular changes.  Global atrophy without hydrocephalus.  No intracranial mass lesion noted on this unenhanced exam.  Vascular: Vascular calcifications.  Skull: No acute abnormality.  Sinuses/Orbits: Post lens replacement without acute orbital abnormality.  Complete opacification left maxillary sinus.  Other: Negative.  IMPRESSION: No intracranial hemorrhage or CT evidence of large acute infarct.  Moderate chronic microvascular changes.  Global atrophy.  Complete opacification left maxillary sinus.       Head CT scan is  Moderate global atrophy. There is also severe periventricular confluent white matter changes. There may be a focal area of reduced signal involving the anterior temporal region on the right side. No  clear cortical infarcts or acute changes however.     Critical care time 45 min   Lillien Petronio A. [name], M.D.  Diplomate, Tax adviser of Psychiatry and Neurology ( Neurology). 07/24/2016, 5:25 PM

## 2016-07-25 DIAGNOSIS — Z7189 Other specified counseling: Secondary | ICD-10-CM

## 2016-07-25 LAB — RENAL FUNCTION PANEL
Albumin: 2.7 g/dL — ABNORMAL LOW (ref 3.5–5.0)
Anion gap: 10 (ref 5–15)
BUN: 7 mg/dL (ref 6–20)
CALCIUM: 8 mg/dL — AB (ref 8.9–10.3)
CO2: 22 mmol/L (ref 22–32)
Chloride: 99 mmol/L — ABNORMAL LOW (ref 101–111)
Creatinine, Ser: 0.71 mg/dL (ref 0.44–1.00)
GFR calc Af Amer: >60 mL/min (ref >60)
GFR calc non Af Amer: >60 mL/min (ref >60)
GLUCOSE: 106 mg/dL — AB (ref 65–99)
PHOSPHORUS: 3 mg/dL (ref 2.5–4.6)
POTASSIUM: 3.8 mmol/L (ref 3.5–5.1)
SODIUM: 131 mmol/L — AB (ref 135–145)

## 2016-07-25 LAB — CBC
HCT: 36.4 % (ref 36.0–46.0)
Hemoglobin: 12.5 g/dL (ref 12.0–15.0)
MCH: 30.5 pg (ref 26.0–34.0)
MCHC: 34.3 g/dL (ref 30.0–36.0)
MCV: 88.8 fL (ref 78.0–100.0)
Platelets: 338 10*3/uL (ref 150–400)
RBC: 4.1 MIL/uL (ref 3.87–5.11)
RDW: 13.6 % (ref 11.5–15.5)
WBC: 11.6 10*3/uL — ABNORMAL HIGH (ref 4.0–10.5)

## 2016-07-25 MED ORDER — SODIUM CHLORIDE 0.9 % IV SOLN
2.0000 mg/h | INTRAVENOUS | Status: DC
  Administered 2016-07-25: 2 mg/h via INTRAVENOUS
  Filled 2016-07-25: qty 10

## 2016-07-25 MED ORDER — VALPROATE SODIUM 500 MG/5ML IV SOLN
INTRAVENOUS | Status: AC
  Filled 2016-07-25: qty 10

## 2016-07-25 NOTE — Progress Notes (Signed)
Subjective: Interval History: Change in the patient medical condition noticed and presently comofort only  Objective: Vital signs in last 24 hours: Temp:  [98.4 F (36.9 C)-100.1 F (37.8 C)] 98.6 F (37 C) (04/06 0749) Pulse Rate:  [109-158] 138 (04/06 0600) Resp:  [22-30] 25 (04/06 0600) BP: (104-159)/(67-97) 136/75 (04/06 0600) SpO2:  [92 %-100 %] 100 % (04/06 0739) Weight change:   Intake/Output from previous day: 04/05 0701 - 04/06 0700 In: 4117.2 [I.V.:3634.8; IV Piggyback:482.4] Out: 1400 [Urine:1400] Intake/Output this shift: No intake/output data recorded.  Generally patient sedated and sleepy Chest she has bilateral coarse expiratory rhonchi and wheezing Heart exam revealed regular rate and rhythm Extremities no edema  Lab Results:  Recent Labs  07/24/16 0449 07/25/16 0437  WBC 12.4* 11.6*  HGB 12.5 12.5  HCT 35.5* 36.4  PLT 349 338   BMET:   Recent Labs  07/24/16 0449 07/25/16 0438  NA 125* 131*  K 3.9 3.8  CL 92* 99*  CO2 24 22  GLUCOSE 130* 106*  BUN 5* 7  CREATININE 0.72 0.71  CALCIUM 7.6* 8.0*   No results for input(Saunders): PTH in the last 72 hours. Iron Studies: No results for input(Saunders): IRON, TIBC, TRANSFERRIN, FERRITIN in the last 72 hours.  Studies/Results: No results found.  I have reviewed the patient'Saunders current medications.  Assessment/Plan: Problem #1 hyponatremia: Hypovolemic hyponatremia. Her sodium is progressively improving today is 131 Problem #2 hypokalemia her potassium is normal today.  Problem #3 hypertension: Her blood pressure is reasonably controlled Problem #4 history of COPD Problem #5 seizure disorder: Patient had EEG showing Status Epilectus and prsently sedated and comfort  Measure only Problem #6 history of anemia Problem #7 history of seizure disorder Problem #8 history of diastolic dysfunction/CHF: Presently patient is nonoliguric and has 1400 mL of urine output. She doesn't have any significant sign of fluid  overload. Plan: We'll D/C ivf with potassium I Will sign of thank you   LOS: 3 days   Lisa Saunders 07/25/2016,8:01 AM

## 2016-07-25 NOTE — Progress Notes (Signed)
PROGRESS NOTE    Lisa Saunders  OVF:[id number] DOB: Oct 21, 1932 DOA: 07/21/2016 PCP: Glo Herring, MD     Brief Narrative:  83 year old woman admitted to the hospital on 4/3 due to blood in stool. She subsequently had a seizure in the emergency department and was found to have a sodium of 114. She was placed on normal saline and admitted to the hospital, unfortunately she had greater than 8 seizure events overnight and decision was made to place her on hypertonic saline which was discontinued on 4/4 after her most recent sodium level was found to be 120. Palliative care discussions are ongoing. On 4/6 decision has been made to transition to comfort care and she is waiting for a bed at residential hospice.   Assessment & Plan:   Principal Problem:   Hyponatremia Active Problems:   Hypertension   Back pain   PAF (paroxysmal atrial fibrillation) (HCC)   Lactic acidosis   Hypokalemia   Palliative care encounter   Goals of care, counseling/discussion   Encounter for hospice care discussion   Hyponatremia -Sodium level currently 131, continue fluids as directed by nephrology. Did receive hypertonic saline on 4/3 given recalcitrant seizures.  Seizures -Question if related to hyponatremia. -Has continued to have some partial seizures today altho improved from prior. -EEG with concerns for status epilepticus. -on Keppra and phenytoin. -Neurology is on board. -Now plan is for comfort care.  History of paroxysmal atrial fibrillation -Not currently on anticoagulation given initial presentation of GI bleed. -Has had episodes of RVR especially when actively seizing.   Acute metabolic encephalopathy -Believed secondary to hyponatremia and multiple seizures overnight. -Is improved today. -It is quite possible that she might have some permanent brain damage from recurrent seizures and hyponatremia, only time will tell. -Neurology consultation appreciated. -Palliative care  discussion are ongoing and decision has been made for comfort care.   DVT prophylaxis: SCDs  Code Status: DO NOT RESUSCITATE  Family Communication: Discussed with husband, son, niece at bedside 4/4 Disposition Plan: Plan for residential hospice once bed available  Consultants:  Nephrology  Neurology  Palliative care   Procedures:  None   Antimicrobials:  Anti-infectives    Start     Dose/Rate Route Frequency Ordered Stop   08/11/2016 2100  piperacillin-tazobactam (ZOSYN) IVPB 3.375 g  Status:  Discontinued     3.375 g 12.5 mL/hr over 240 Minutes Intravenous Every 8 hours 08/16/2016 1904 07/24/16 1321   08/04/2016 2000  vancomycin (VANCOCIN) IVPB 1000 mg/200 mL premix  Status:  Discontinued     1,000 mg 200 mL/hr over 60 Minutes Intravenous Every 12 hours 07/30/2016 1903 07/24/16 1321   08/17/2016 1045  vancomycin (VANCOCIN) IVPB 1000 mg/200 mL premix     1,000 mg 200 mL/hr over 60 Minutes Intravenous  Once 07/24/2016 1039 07/23/2016 1233   08/03/2016 1030  cefTRIAXone (ROCEPHIN) 1 g in dextrose 5 % 50 mL IVPB     1 g Intravenous  Once 07/21/2016 1025 08/01/2016 1135       Subjective: Sleepy, can open eyes and move head towards voices when being spoken to, however is unable to verbalize.   Objective: Vitals:   07/25/16 1000 07/25/16 1100 07/25/16 1121 07/25/16 1600  BP: 139/88 (!) 141/82  96/68  Pulse: (!) 139 (!) 137  (!) 149  Resp: (!) 24 (!) 23  13  Temp:   98.5 F (36.9 C)   TempSrc:   Axillary   SpO2: 97% 97%  92%  Weight:  Height:        Intake/Output Summary (Last 24 hours) at 07/25/16 1713 Last data filed at 07/25/16 1444  Gross per 24 hour  Intake          1381.15 ml  Output             2150 ml  Net          -768.85 ml   Filed Weights   08/04/2016 1320 07/25/2016 1700 07/23/16 0500  Weight: 73 kg (161 lb) 75.4 kg (166 lb 3.6 oz) 75.4 kg (166 lb 3.6 oz)    Examination:  General exam:Drowsy  Respiratory system: Clear to auscultation. Respiratory effort  normal. Cardiovascular system Tachycardic, irregular. Gastrointestinal system: Abdomen is nondistended, soft and nontender. No organomegaly or masses felt. Normal bowel sounds heard. Central nervous system: Unable to assess given current mental state  Extremities: No C/C/E, +pedal pulses Skin: No rashes, lesions or ulcers Psychiatry: Unable to assess given current mental state     Data Reviewed: I have personally reviewed following labs and imaging studies  CBC:  Recent Labs Lab 07/23/2016 1024 07/23/16 0413 07/24/16 0449 07/25/16 0437  WBC 16.8* 14.8* 12.4* 11.6*  NEUTROABS 13.9*  --   --   --   HGB 14.6 13.9 12.5 12.5  HCT 40.3 38.0 35.5* 36.4  MCV 84.5 84.1 86.6 88.8  PLT 348 327 349 937   Basic Metabolic Panel:  Recent Labs Lab 08/05/2016 1341  08/03/2016 2000  07/23/16 0636 07/23/16 0807 07/23/16 1014 07/24/16 0449 07/24/16 1135 07/25/16 0438  NA 115*  < >  --   < > 120* 121* 120* 125*  --  131*  K <2.0*  < >  --   < > 2.8* 2.7* 3.0* 3.9  --  3.8  CL 73*  < >  --   < > 82* 82* 81* 92*  --  99*  CO2 30  < >  --   < > 26 28 27 24   --  22  GLUCOSE 114*  < >  --   < > 125* 137* 149* 130*  --  106*  BUN 12  < >  --   < > 6 5* 5* 5*  --  7  CREATININE 0.68  < >  --   < > 0.53 0.63 0.63 0.72  --  0.71  CALCIUM 7.7*  < >  --   < > 7.8* 7.8* 7.8* 7.6*  --  8.0*  MG 1.2*  --   --   --   --   --   --   --  1.5*  --   PHOS  --   --  2.3*  --   --   --   --  2.4*  --  3.0  < > = values in this interval not displayed. GFR: Estimated Creatinine Clearance: 54.9 mL/min (by C-G formula based on SCr of 0.71 mg/dL). Liver Function Tests:  Recent Labs Lab 08/07/2016 1024 07/23/2016 1133 07/24/16 0449 07/25/16 0438  AST 47* 34  --   --   ALT 26 21  --   --   ALKPHOS 79 62  --   --   BILITOT 1.4* 0.8  --   --   PROT 7.3 5.9*  --   --   ALBUMIN 3.8 3.0* 2.8* 2.7*   No results for input(s): LIPASE, AMYLASE in the last 168 hours. No results for input(s): AMMONIA in the last 168  hours. Coagulation  Profile:  Recent Labs Lab 08/03/2016 1024  INR 1.15   Cardiac Enzymes:  Recent Labs Lab 07/28/2016 1818 07/23/16 0044 07/23/16 0636  TROPONINI <0.03 <0.03 <0.03   BNP (last 3 results) No results for input(s): PROBNP in the last 8760 hours. HbA1C: No results for input(s): HGBA1C in the last 72 hours. CBG: No results for input(s): GLUCAP in the last 168 hours. Lipid Profile: No results for input(s): CHOL, HDL, LDLCALC, TRIG, CHOLHDL, LDLDIRECT in the last 72 hours. Thyroid Function Tests: No results for input(s): TSH, T4TOTAL, FREET4, T3FREE, THYROIDAB in the last 72 hours. Anemia Panel:  Recent Labs  07/24/16 0449  VITAMINB12 2,310*   Urine analysis:    Component Value Date/Time   COLORURINE YELLOW 07/20/2016 [place] 08/17/2016 1231   LABSPEC 1.008 07/25/2016 1231   PHURINE 7.0 07/31/2016 1231   GLUCOSEU NEGATIVE 07/20/2016 1231   HGBUR NEGATIVE 07/31/2016 1231   BILIRUBINUR NEGATIVE 08/06/2016 1231   KETONESUR NEGATIVE 08/18/2016 1231   PROTEINUR NEGATIVE 07/30/2016 1231   UROBILINOGEN 0.2 07/19/2011 0859   NITRITE NEGATIVE 08/11/2016 1231   LEUKOCYTESUR NEGATIVE 08/01/2016 1231   Sepsis Labs: @LABRCNTIP (procalcitonin:4,lacticidven:4)  ) Recent Results (from the past 240 hour(s))  Blood Culture (routine x 2)     Status: None (Preliminary result)   Collection Time: 08/09/2016 10:24 AM  Result Value Ref Range Status   Specimen Description BLOOD RIGHT FOREARM  Final   Special Requests   Final    BOTTLES DRAWN AEROBIC AND ANAEROBIC Blood Culture adequate volume   Culture NO GROWTH 3 DAYS  Final   Report Status PENDING  Incomplete  Blood Culture (routine x 2)     Status: None (Preliminary result)   Collection Time: 08/10/2016 10:36 AM  Result Value Ref Range Status   Specimen Description BLOOD RIGHT HAND  Final   Special Requests   Final    BOTTLES DRAWN AEROBIC ONLY Blood Culture results may not be optimal due to an  inadequate volume of blood received in culture bottles   Culture NO GROWTH 3 DAYS  Final   Report Status PENDING  Incomplete  MRSA PCR Screening     Status: Abnormal   Collection Time: 08/02/2016  5:36 PM  Result Value Ref Range Status   MRSA by PCR POSITIVE (A) NEGATIVE Final    Comment:        The GeneXpert MRSA Assay (FDA approved for NASAL specimens only), is one component of a comprehensive MRSA colonization surveillance program. It is not intended to diagnose MRSA infection nor to guide or monitor treatment for MRSA infections. RESULT CALLED TO, READ BACK BY AND VERIFIED WITH:  DANIELS,J @ 0017 ON 07/23/16 BY JUW          Radiology Studies: No results found.      Scheduled Meds: . chlorhexidine  15 mL Mouth Rinse BID  . Chlorhexidine Gluconate Cloth  6 each Topical Q0600  . levETIRAcetam  1,500 mg Intravenous Q12H  . LORazepam  1 mg Intravenous Q8H  . mouth rinse  15 mL Mouth Rinse q12n4p  . mupirocin ointment  1 application Nasal BID  . phenytoin (DILANTIN) IV  120 mg Intravenous Q8H  . scopolamine  1 patch Transdermal Q72H  . valproate sodium  1 g Intravenous Q8H   Continuous Infusions: . morphine 5 mg/hr (07/25/16 1546)     LOS: 3 days    Time spent: 35 minutes. Greater than 50% of this time was spent in direct contact with the  patient coordinating care.     Lelon Frohlich, MD [facility] Pager [phone]  If 7PM-7AM, please contact night-coverage [email or web address] Password [email or web address] 07/25/2016, 5:13 PM

## 2016-07-25 NOTE — Progress Notes (Signed)
Daily Progress Note   Patient Name: Lisa Saunders       Date: 07/25/2016 DOB: 08/27/1932  Age: 83 y.o. MRN#: [id number] Attending Physician: Koleen Nimrod Acost* Primary Care Physician: Glo Herring, MD Admit Date: 08/14/2016  Reason for Consultation/Follow-up: Establishing goals of care and Psychosocial/spiritual support  Subjective: Mrs. Thome is lying quietly in bed. She will briefly open her eyes. She has periods of what appears to be apneic breathing, but is saturating well (98%) on 2 L nasal cannula.  Mr. Galambos arrives, and we talk about the sudden changes that occurred yesterday. Mr. Sevillano states that they are ready to focus on comfort and dignity, no more invasive testing. We talk about the use of morphine for comfort, Mrs. Checo is known to have chronic pain, and also for helping with her breathing. Mr. Momon states that his wife has an allergy to morphine. We discuss the benefits of morphine outweighing the risk at this time. Mr. Teed agrees stating comfort is important. Present today also at bedside are sons Ronalee Belts and Lanny Hurst. They are supporting their mother and father through this difficult time.  Length of Stay: 3  Current Medications: Scheduled Meds:  . chlorhexidine  15 mL Mouth Rinse BID  . Chlorhexidine Gluconate Cloth  6 each Topical Q0600  . levETIRAcetam  1,500 mg Intravenous Q12H  . LORazepam  1 mg Intravenous Q8H  . mouth rinse  15 mL Mouth Rinse q12n4p  . mupirocin ointment  1 application Nasal BID  . phenytoin (DILANTIN) IV  120 mg Intravenous Q8H  . scopolamine  1 patch Transdermal Q72H  . valproate sodium  1 g Intravenous Q8H    Continuous Infusions: . morphine 3 mg/hr (07/25/16 1208)    PRN Meds: albuterol, metoprolol  Physical  Exam  Constitutional: No distress.  Appears to be having apneic breathing  HENT:  Head: Normocephalic and atraumatic.  Cardiovascular:  Rate in the 130s  Pulmonary/Chest:  Question chain Stokes breathing  Abdominal: Soft. She exhibits no distension.  Musculoskeletal: She exhibits no edema.  Neurological:  Opens eyes, one word statements to family  Skin: Skin is warm and dry.  Nursing note and vitals reviewed.           Vital Signs: BP (!) 141/82   Pulse (!) 137   Temp 98.5  F (36.9 C) (Axillary)   Resp (!) 23   Ht 5\' 4"  (1.626 m)   Wt 75.4 kg (166 lb 3.6 oz)   SpO2 97%   BMI 28.53 kg/m  SpO2: SpO2: 97 % O2 Device: O2 Device: Nasal Cannula O2 Flow Rate: O2 Flow Rate (L/min): 2 L/min  Intake/output summary:  Intake/Output Summary (Last 24 hours) at 07/25/16 1255 Last data filed at 07/25/16 0507  Gross per 24 hour  Intake          4117.15 ml  Output             1400 ml  Net          2717.15 ml   LBM: Last BM Date: 08/17/2016 Baseline Weight: Weight: 73 kg (161 lb) Most recent weight: Weight: 75.4 kg (166 lb 3.6 oz)       Palliative Assessment/Data:    Flowsheet Rows     Most Recent Value  Intake Tab  Referral Department  Hospitalist  Unit at Time of Referral  ICU  Palliative Care Primary Diagnosis  Neurology  Date Notified  07/23/16  Palliative Care Type  New Palliative care  Reason for referral  Clarify Goals of Care  Date of Admission  08/17/2016  Date first seen by Palliative Care  07/23/16  # of days Palliative referral response time  0 Day(s)  # of days IP prior to Palliative referral  1  Clinical Assessment  Palliative Performance Scale Score  10%  Pain Max last 24 hours  Not able to report  Pain Min Last 24 hours  Not able to report  Dyspnea Max Last 24 Hours  Not able to report  Dyspnea Min Last 24 hours  Not able to report  Psychosocial & Spiritual Assessment  Palliative Care Outcomes  Patient/Family meeting held?  Yes  Who was at the meeting?   Son Ronalee Belts and his husband Ulice Dash, Niece Margaretha Sheffield  Palliative Care Outcomes  Provided psychosocial or spiritual support, Provided end of life care assistance, Clarified goals of care  Patient/Family wishes: Interventions discontinued/not started   Mechanical Ventilation, Tube feedings/TPN      Patient Active Problem List   Diagnosis Date Noted  . Palliative care encounter   . Goals of care, counseling/discussion   . Hyponatremia 08/07/2016  . Lactic acidosis 08/17/2016  . Hypokalemia 07/20/2016  . GERD (gastroesophageal reflux disease) 12/18/2014  . Weight loss 12/18/2014  . PAF (paroxysmal atrial fibrillation) ([place]) 03/09/2013  . Heme positive stool 03/08/2013  . Leg edema 10/26/2012  . Constipation 03/24/2012  . Asthma 10/08/2011  . Hypertension 07/08/2011  . Back pain 07/08/2011  . Muscle weakness (generalized) 01/13/2011  . Abnormality of gait 01/13/2011  . Difficulty in walking(719.7) 01/13/2011    Palliative Care Assessment & Plan   Patient Profile: 83 y.o.femalewith past medical history of Anxiety and depression, chronic cystitis, COPD, fibromyalgia, hypertension, mixed incontinence, functional decline over the last year, with particular declines over the last 2 months being basically bedbound admitted on 4/3/2018with hyponatremia (115).   Assessment: Hyponatremia: came in at 112-115, increased today to 125. Mg replaced and K+ replaced.  Continue to monitor.  Seizure disorder: per neurology, added dilantin, increased keppra. 1-2? Seizures overnight 4/4.  Seizure; EEG shows status epilepticus, family is at this point ready to focus on comfort and dignity, no invasive testing.  Recommendations/Plan:  comfort measures only, morphine continuous infusion to control chronic pain and dyspnea  Goals of Care and Additional Recommendations:  Limitations on Scope of  Treatment: Full Comfort Care  Code Status:    Code Status Orders        Start     Ordered   07/23/16 1153   Do not attempt resuscitation (DNR)  Continuous    Question Answer Comment  In the event of cardiac or respiratory ARREST Do not call a "code blue"   In the event of cardiac or respiratory ARREST Do not perform Intubation, CPR, defibrillation or ACLS   In the event of cardiac or respiratory ARREST Use medication by any route, position, wound care, and other measures to relive pain and suffering. May use oxygen, suction and manual treatment of airway obstruction as needed for comfort.      07/23/16 1153    Code Status History    Date Active Date Inactive Code Status Order ID Comments User Context   07/23/2016  4:58 PM 07/23/2016 11:53 AM Full Code [id number]  Bethena Roys, MD Inpatient       Prognosis:   Hours - Days  Discharge Planning:  Anticipated Hospital Death  Care plan was discussed with nursing staff, case manager, social worker, and Dr. Jerilee Hoh.  Thank you for allowing the Palliative Medicine Team to assist in the care of this patient.   Time In: 0940 Time Out: 1020 Total Time 40 minutes Prolonged Time Billed  no       Greater than 50%  of this time was spent counseling and coordinating care related to the above assessment and plan.  Drue Novel, NP  Please contact Palliative Medicine Team phone at [phone] for questions and concerns.

## 2016-07-25 NOTE — Care Management Important Message (Signed)
Important Message  Patient Details  Name: Lisa Saunders MRN: [id number] Date of Birth: 12-Aug-1932   Medicare Important Message Given:  Yes    Hershey Knauer, Chauncey Reading, RN 07/25/2016, 3:15 PM

## 2016-07-25 NOTE — Progress Notes (Signed)
LCSW following for disposition:  Referral to hospice home.  Family requesting [facility]. Spoke with NP Aniceto Boss who feels patient can transfer to hospice. Call placed to [facility] regarding referral and bed status.  There are no current beds, however LCSW was asked to fax information to be looked at.  Completed referral. Hospice home to follow up when bed available.  Lane Hacker, MSW Clinical Social Work: [employer] Coverage for :  [phone]

## 2016-07-27 LAB — CULTURE, BLOOD (ROUTINE X 2)
CULTURE: NO GROWTH
CULTURE: NO GROWTH
SPECIAL REQUESTS: ADEQUATE

## 2016-07-28 LAB — HOMOCYSTEINE: Homocysteine: 8.3 umol/L (ref 0.0–15.0)

## 2016-08-19 ENCOUNTER — Ambulatory Visit (INDEPENDENT_AMBULATORY_CARE_PROVIDER_SITE_OTHER): Payer: Commercial Managed Care - HMO | Admitting: Internal Medicine

## 2016-08-19 NOTE — Progress Notes (Signed)
Patients heart rate falling rapidly to 20-30's / family called and informed of change in status. family encouraged to come to hospital. Stated they are on there way now. Spoke with Lanny Hurst patients son

## 2016-08-19 NOTE — Discharge Summary (Signed)
DEATH SUMMARY  Patient was initially admitted to the hospital on 4/3 due to GI Bleeding. Was found to be hyponatremic and had a seizure in the ED. Because she continued to seize she was started on hypertonic saline. Unfortunately, seizures never ceased, despite correction of sodium levels, and she was found to be in status epilepticus. She was placed on 3 antiepileptic medications. Neurology recommended a medically induced come with continuous EEG, but family refused wanting to focus on comfort care. Palliative care was involved and helped with family conversations and decision-making process. She was started on a morphine infusion due to significant respiratory distress, She subsequently expired on 2022-08-04 at 3:15 am.  Causes of Death: Status Epilepticus Acute Metabolic encephalopathy Hyponatremia A Fib  Domingo Mend, MD [facility] Pager: [phone]

## 2016-08-19 NOTE — Progress Notes (Signed)
Dr Darrick Meigs called and informed that patient expired @ 315.

## 2016-08-19 DEATH — deceased

## 2017-03-04 IMAGING — CT CT ABD-PELV W/ CM
3 of 5 series · 14 of 36 positions shown, 17 images · IV contrast (APPLIED)
Comparison: None.

CLINICAL DATA: Status post fall two weeks ago

EXAM:
CT CHEST, ABDOMEN, AND PELVIS WITH CONTRAST
TECHNIQUE: Multidetector CT imaging of the chest, abdomen and pelvis was
performed following the standard protocol during bolus
administration of intravenous contrast.
CONTRAST:  100mL 2PV5IH-NLL IOPAMIDOL (2PV5IH-NLL) INJECTION 61%

[Series 2: cap with · axial · 0.77mm/px · z∈[-265,+150]mm · 9 of 105 slices shown, 12 images]
[im 11/105  mediastinal]
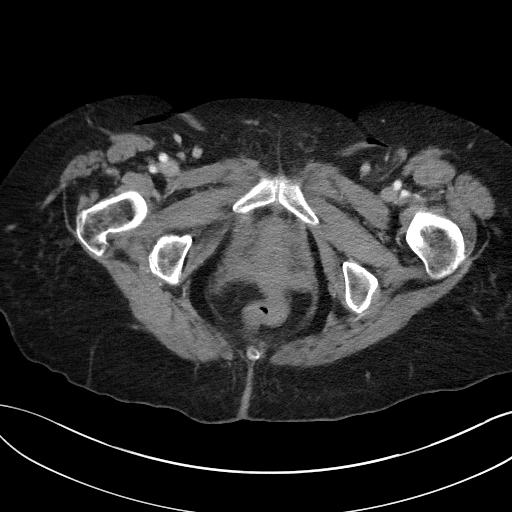
[im 11/105  lung]
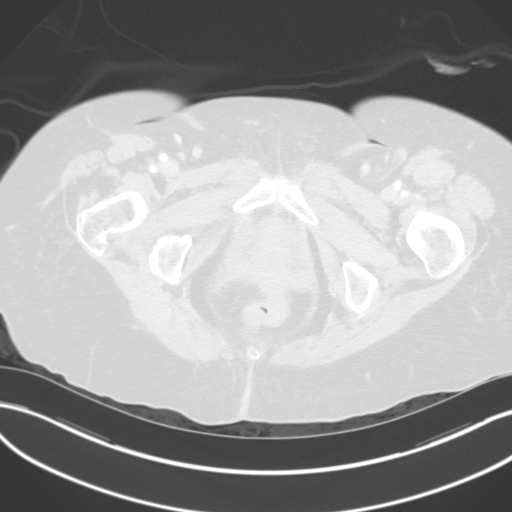
[im 21/105  lung]
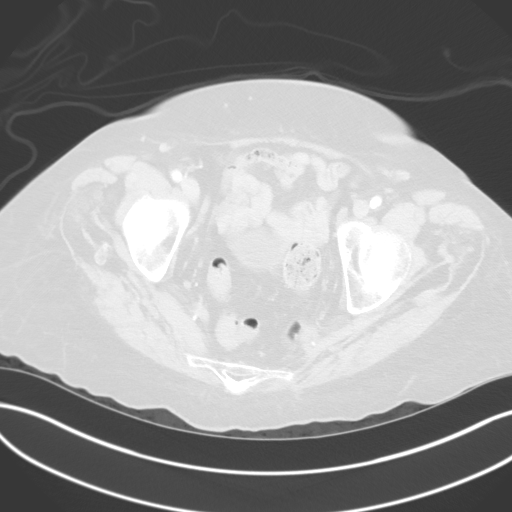
[im 32/105  lung]
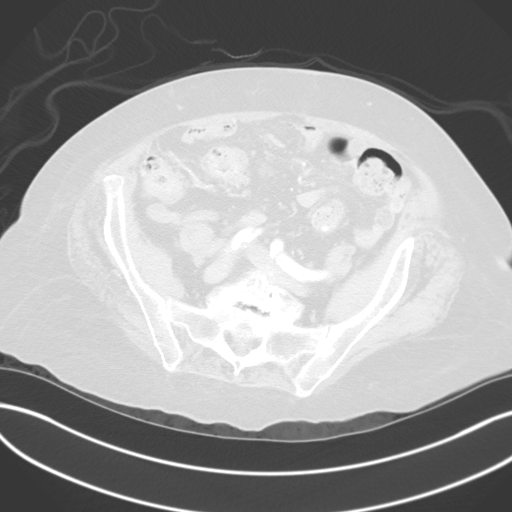
[im 42/105  lung]
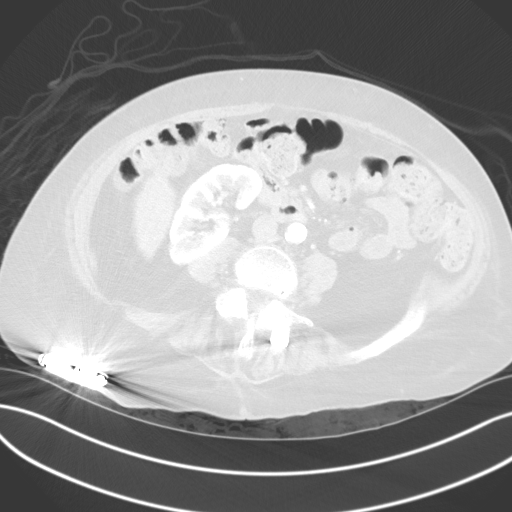
[im 53/105  mediastinal]
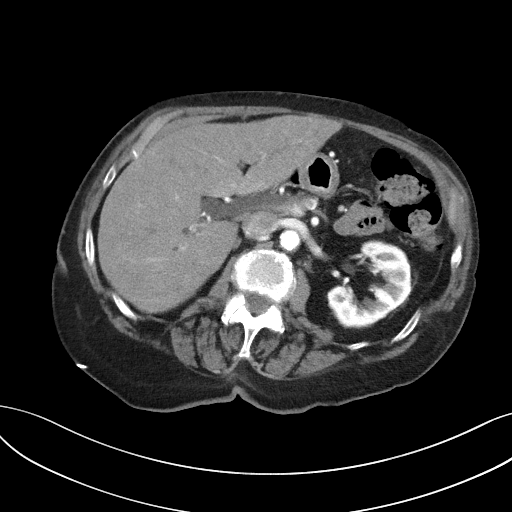
[im 53/105  lung]
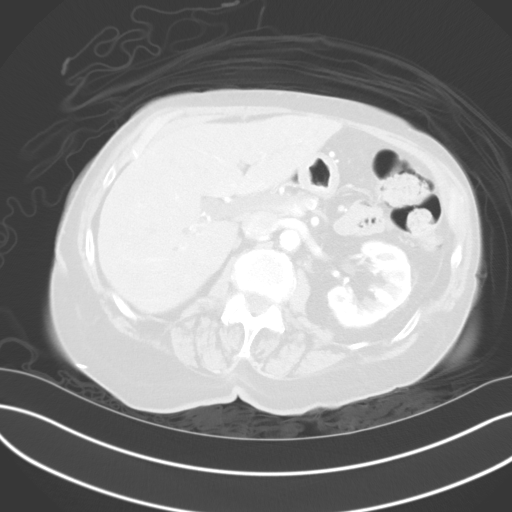
[im 63/105  lung]
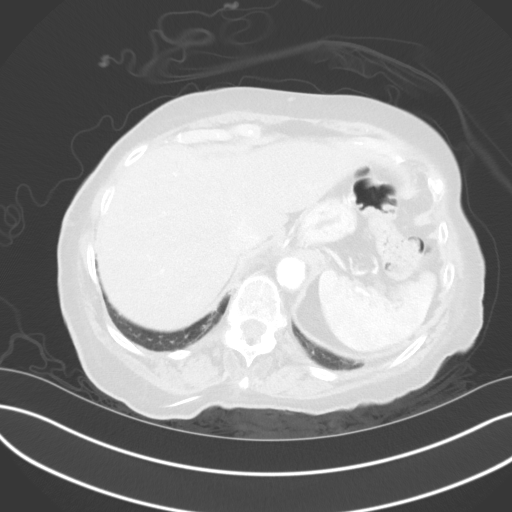
[im 73/105  lung]
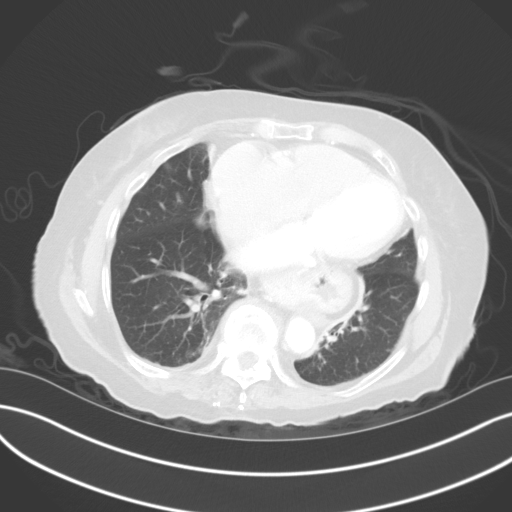
[im 84/105  lung]
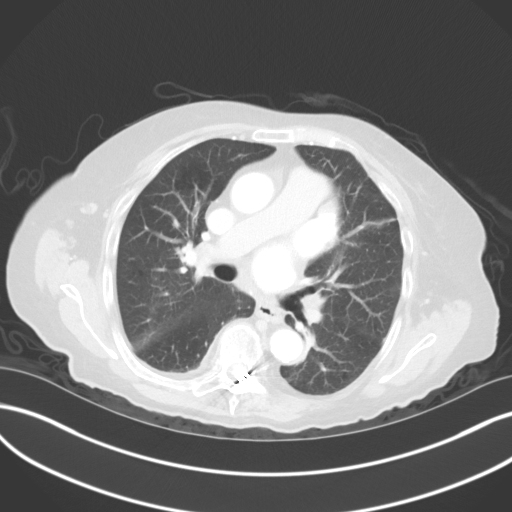
[im 94/105  mediastinal]
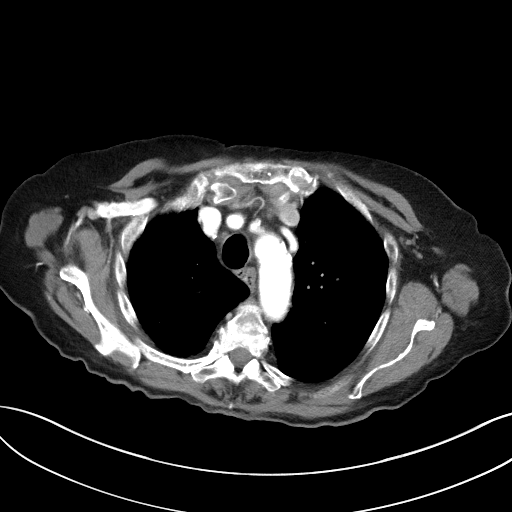
[im 94/105  lung]
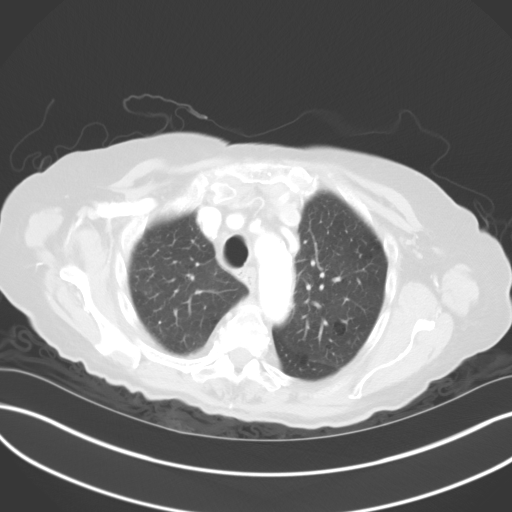

[Series 3: lung · axial · 0.77mm/px · z∈[-25,+17]mm · 2 of 126 slices shown]
[im 11/126  lung]
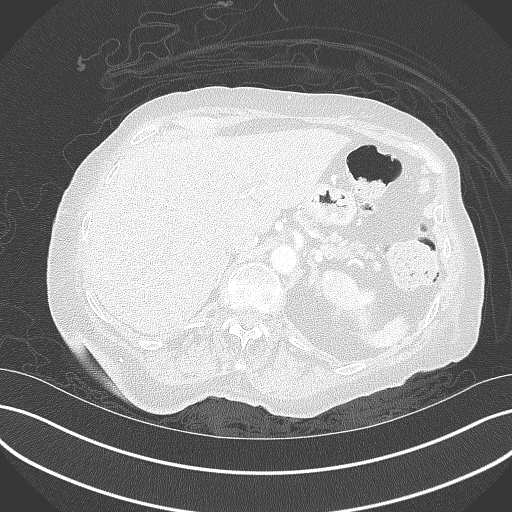
[im 32/126  lung]
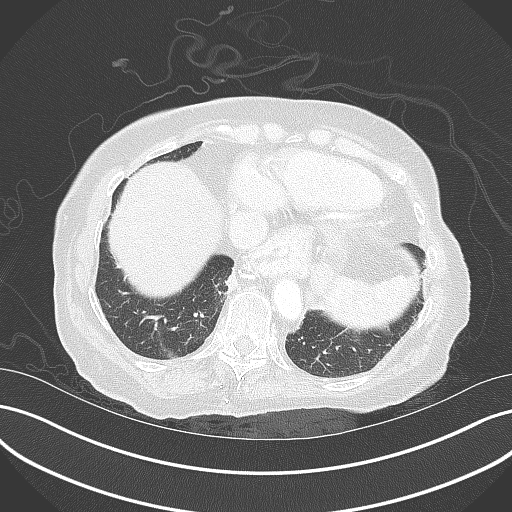

[Series 4: coronals · coronal · 0.80mm/px · 3 of 137 slices shown]
[im 28/137  lung]
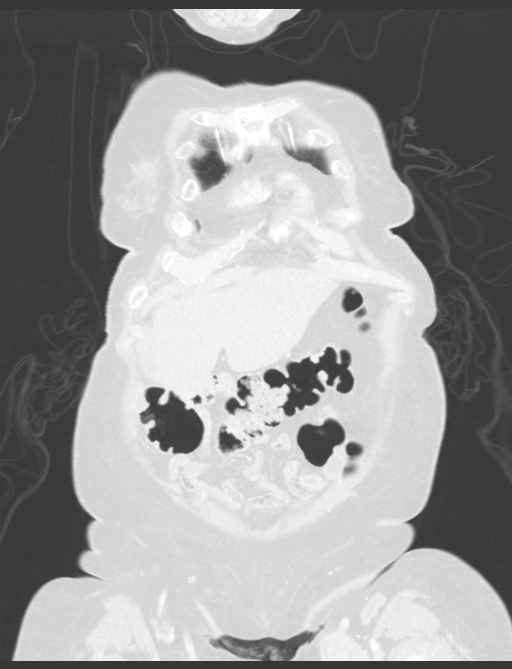
[im 55/137  lung]
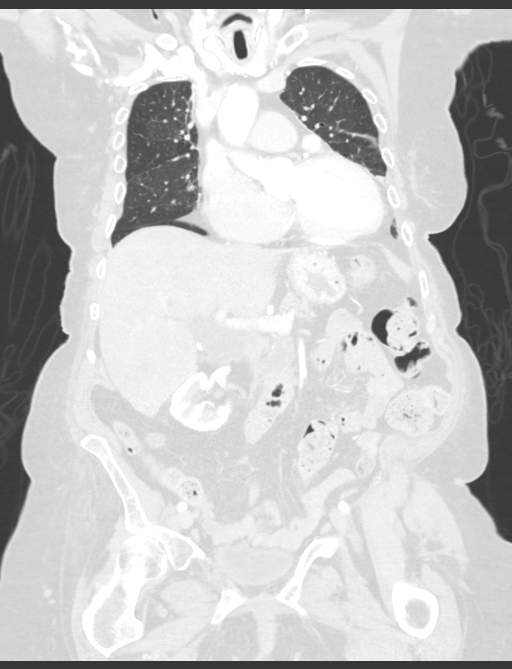
[im 82/137  lung]
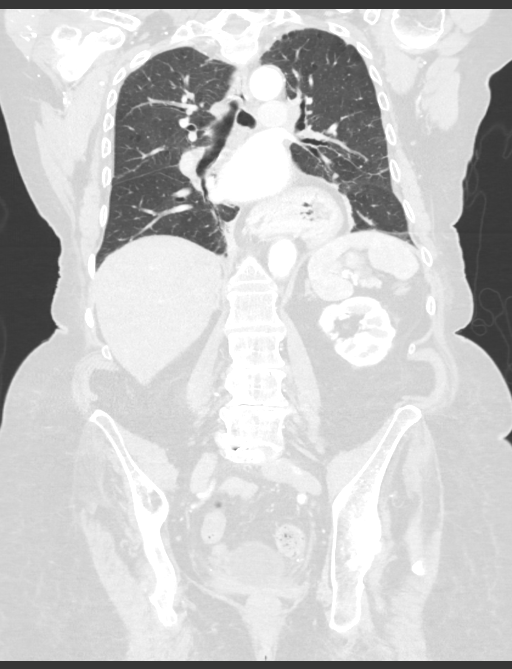

[14 of 36 positions shown; findings below may reference images not displayed]

FINDINGS: CT CHEST FINDINGS

Cardiovascular: There is atherosclerotic calcification within the
thoracic aorta and the proximal arch vessels. There is a normal 3
vessel aortic branching pattern. No aneurysm or dissection is seen.
There is biatrial cardiac enlargement. No pericardial effusion.

Mediastinum/Nodes: Level 4 lymph node measures 1.2 cm. Otherwise, no
enlarged or abnormal density mediastinal or axillary lymph nodes.
Thyroid gland is normal. There is a medium-sized sliding-type hiatal
hernia.

Lungs/Pleura: There is bilateral apical predominant emphysema. No
pleural effusion. No focal consolidation. No pulmonary nodules or
masses.

Musculoskeletal: There is a thoracic spinal stimulator device with
tip terminating at the T7 level. No bony spinal canal stenosis.

CT ABDOMEN PELVIS FINDINGS

Hepatobiliary: No pneumobilia or portal venous gas. No ascites. No
focal hepatic lesions. The gallbladder is surgically absent. There
is mild postcholecystectomy extrahepatic biliary dilatation.

Pancreas: Mild fatty atrophy of the pancreas. No peripancreatic
fluid collection.

Spleen: Normal

Adrenals/Urinary Tract: Normal adrenal glands. No hydronephrosis or
solid renal mass. Lobulated contours of both kidneys. No
nephrolithiasis.

Stomach/Bowel: Medium-sized sliding hiatal hernia. No dilated small
bowel. No evidence of acute colonic or enteric inflammation. Despite
review of multiplanar reformatted images in 3 orthogonal planes, the
appendix could not be adequately visualized. However, there is no
inflammatory stranding or free fluid within the right lower
quadrant.

Vascular/Lymphatic: There is atherosclerotic calcification of the
non aneurysmal abdominal aorta and its branch vessels. The splenic
vein and main portal vein are patent. There is hypoattenuation
within the superior mesenteric vein, likely contrast mixing
artifact.

Reproductive: Status post hysterectomy. Ovaries not clearly
identified. No free fluid the pelvis.

Other: No abdominal wall hernia or abnormality. No abdominopelvic
ascites.

Musculoskeletal: Compression deformities of the T4, T5 and T6
vertebral bodies are unchanged compared to 01/15/2007. Compression
fracture of the T10 body has worsened from 7222. There is grade 2
L4-L5 anterolisthesis. L4-L5 posterior spinal fusion hardware are
with screws traversing the left pedicles shows no focal hardware
abnormality. There is inferior subsidence of the disc spacer at the
superior L5 endplate.
IMPRESSION: 1. Multiple thoracic compression fractures. Fractures at T4-T6 are
chronic and unchanged compared to 01/15/2007. The T10 fracture is
new compared to 08/01/2010, but otherwise age indeterminate.
2. Medium-sized hiatal hernia.
3. Aortic atherosclerosis.
